# Patient Record
Sex: Male | Born: 1975 | Race: White | Hispanic: No | Marital: Married | Smoking: Current every day smoker
Health system: Southern US, Community
[De-identification: ages and names within clinical notes are randomized; demographics above are authoritative.]

## PROBLEM LIST (undated history)

## (undated) DIAGNOSIS — I509 Heart failure, unspecified: Secondary | ICD-10-CM

## (undated) DIAGNOSIS — F4310 Post-traumatic stress disorder, unspecified: Secondary | ICD-10-CM

## (undated) DIAGNOSIS — R51 Headache: Secondary | ICD-10-CM

## (undated) DIAGNOSIS — R519 Headache, unspecified: Secondary | ICD-10-CM

## (undated) DIAGNOSIS — I4891 Unspecified atrial fibrillation: Secondary | ICD-10-CM

## (undated) DIAGNOSIS — F1110 Opioid abuse, uncomplicated: Secondary | ICD-10-CM

## (undated) HISTORY — DX: Headache, unspecified: R51.9

## (undated) HISTORY — PX: TONSILLECTOMY: SUR1361

## (undated) HISTORY — DX: Headache: R51

---

## 1998-04-14 ENCOUNTER — Emergency Department (HOSPITAL_COMMUNITY): Admission: EM | Admit: 1998-04-14 | Discharge: 1998-04-14 | Payer: Self-pay | Admitting: Emergency Medicine

## 1999-01-29 ENCOUNTER — Emergency Department (HOSPITAL_COMMUNITY): Admission: EM | Admit: 1999-01-29 | Discharge: 1999-01-29 | Payer: Self-pay | Admitting: Emergency Medicine

## 2002-10-18 ENCOUNTER — Emergency Department (HOSPITAL_COMMUNITY): Admission: EM | Admit: 2002-10-18 | Discharge: 2002-10-18 | Payer: Self-pay | Admitting: Emergency Medicine

## 2003-03-07 ENCOUNTER — Emergency Department (HOSPITAL_COMMUNITY): Admission: EM | Admit: 2003-03-07 | Discharge: 2003-03-07 | Payer: Self-pay | Admitting: Emergency Medicine

## 2003-11-19 ENCOUNTER — Encounter: Admission: RE | Admit: 2003-11-19 | Discharge: 2003-11-19 | Payer: Self-pay | Admitting: Family Medicine

## 2003-12-11 ENCOUNTER — Encounter: Admission: RE | Admit: 2003-12-11 | Discharge: 2003-12-11 | Payer: Self-pay | Admitting: Family Medicine

## 2004-03-06 ENCOUNTER — Emergency Department (HOSPITAL_COMMUNITY): Admission: EM | Admit: 2004-03-06 | Discharge: 2004-03-06 | Payer: Self-pay | Admitting: Emergency Medicine

## 2004-04-25 ENCOUNTER — Emergency Department (HOSPITAL_COMMUNITY): Admission: EM | Admit: 2004-04-25 | Discharge: 2004-04-25 | Payer: Self-pay | Admitting: Family Medicine

## 2004-08-03 ENCOUNTER — Ambulatory Visit (HOSPITAL_COMMUNITY): Admission: RE | Admit: 2004-08-03 | Discharge: 2004-08-03 | Payer: Self-pay | Admitting: Otolaryngology

## 2004-08-03 ENCOUNTER — Encounter (INDEPENDENT_AMBULATORY_CARE_PROVIDER_SITE_OTHER): Payer: Self-pay | Admitting: *Deleted

## 2004-08-03 ENCOUNTER — Ambulatory Visit (HOSPITAL_BASED_OUTPATIENT_CLINIC_OR_DEPARTMENT_OTHER): Admission: RE | Admit: 2004-08-03 | Discharge: 2004-08-03 | Payer: Self-pay | Admitting: Otolaryngology

## 2004-11-29 ENCOUNTER — Emergency Department (HOSPITAL_COMMUNITY): Admission: EM | Admit: 2004-11-29 | Discharge: 2004-11-29 | Payer: Self-pay | Admitting: Emergency Medicine

## 2005-02-02 ENCOUNTER — Emergency Department (HOSPITAL_COMMUNITY): Admission: EM | Admit: 2005-02-02 | Discharge: 2005-02-03 | Payer: Self-pay | Admitting: Emergency Medicine

## 2005-02-03 ENCOUNTER — Ambulatory Visit: Payer: Self-pay | Admitting: Psychiatry

## 2005-02-03 ENCOUNTER — Inpatient Hospital Stay (HOSPITAL_COMMUNITY): Admission: RE | Admit: 2005-02-03 | Discharge: 2005-02-04 | Payer: Self-pay | Admitting: Psychiatry

## 2005-02-03 ENCOUNTER — Emergency Department (HOSPITAL_COMMUNITY): Admission: EM | Admit: 2005-02-03 | Discharge: 2005-02-03 | Payer: Self-pay | Admitting: Emergency Medicine

## 2005-02-14 ENCOUNTER — Emergency Department: Payer: Self-pay | Admitting: Emergency Medicine

## 2005-03-28 ENCOUNTER — Emergency Department (HOSPITAL_COMMUNITY): Admission: EM | Admit: 2005-03-28 | Discharge: 2005-03-28 | Payer: Self-pay | Admitting: Emergency Medicine

## 2005-04-13 ENCOUNTER — Emergency Department (HOSPITAL_COMMUNITY): Admission: EM | Admit: 2005-04-13 | Discharge: 2005-04-13 | Payer: Self-pay | Admitting: Emergency Medicine

## 2005-04-14 ENCOUNTER — Ambulatory Visit: Payer: Self-pay | Admitting: Unknown Physician Specialty

## 2005-05-09 ENCOUNTER — Emergency Department (HOSPITAL_COMMUNITY): Admission: EM | Admit: 2005-05-09 | Discharge: 2005-05-09 | Payer: Self-pay | Admitting: Family Medicine

## 2005-05-14 ENCOUNTER — Ambulatory Visit: Payer: Self-pay | Admitting: Unknown Physician Specialty

## 2005-06-13 ENCOUNTER — Emergency Department (HOSPITAL_COMMUNITY): Admission: EM | Admit: 2005-06-13 | Discharge: 2005-06-13 | Payer: Self-pay | Admitting: Family Medicine

## 2005-06-14 ENCOUNTER — Emergency Department: Payer: Self-pay | Admitting: General Practice

## 2005-07-21 ENCOUNTER — Emergency Department (HOSPITAL_COMMUNITY): Admission: EM | Admit: 2005-07-21 | Discharge: 2005-07-21 | Payer: Self-pay | Admitting: Emergency Medicine

## 2005-08-27 ENCOUNTER — Emergency Department (HOSPITAL_COMMUNITY): Admission: EM | Admit: 2005-08-27 | Discharge: 2005-08-27 | Payer: Self-pay | Admitting: Emergency Medicine

## 2005-08-31 ENCOUNTER — Emergency Department (HOSPITAL_COMMUNITY): Admission: EM | Admit: 2005-08-31 | Discharge: 2005-08-31 | Payer: Self-pay | Admitting: Family Medicine

## 2005-09-15 ENCOUNTER — Emergency Department (HOSPITAL_COMMUNITY): Admission: EM | Admit: 2005-09-15 | Discharge: 2005-09-16 | Payer: Self-pay | Admitting: Emergency Medicine

## 2005-09-23 ENCOUNTER — Emergency Department (HOSPITAL_COMMUNITY): Admission: EM | Admit: 2005-09-23 | Discharge: 2005-09-23 | Payer: Self-pay | Admitting: Family Medicine

## 2005-09-30 ENCOUNTER — Emergency Department (HOSPITAL_COMMUNITY): Admission: EM | Admit: 2005-09-30 | Discharge: 2005-09-30 | Payer: Self-pay | Admitting: Family Medicine

## 2005-10-09 ENCOUNTER — Emergency Department (HOSPITAL_COMMUNITY): Admission: EM | Admit: 2005-10-09 | Discharge: 2005-10-09 | Payer: Self-pay | Admitting: Family Medicine

## 2005-11-03 ENCOUNTER — Emergency Department (HOSPITAL_COMMUNITY): Admission: EM | Admit: 2005-11-03 | Discharge: 2005-11-03 | Payer: Self-pay | Admitting: Emergency Medicine

## 2005-11-13 ENCOUNTER — Emergency Department: Payer: Self-pay | Admitting: Emergency Medicine

## 2005-11-23 ENCOUNTER — Emergency Department (HOSPITAL_COMMUNITY): Admission: EM | Admit: 2005-11-23 | Discharge: 2005-11-23 | Payer: Self-pay | Admitting: Emergency Medicine

## 2005-11-23 ENCOUNTER — Emergency Department (HOSPITAL_COMMUNITY): Admission: EM | Admit: 2005-11-23 | Discharge: 2005-11-23 | Payer: Self-pay | Admitting: Family Medicine

## 2005-12-17 ENCOUNTER — Emergency Department (HOSPITAL_COMMUNITY): Admission: EM | Admit: 2005-12-17 | Discharge: 2005-12-17 | Payer: Self-pay | Admitting: Family Medicine

## 2006-01-23 ENCOUNTER — Emergency Department: Payer: Self-pay | Admitting: Emergency Medicine

## 2006-02-12 ENCOUNTER — Emergency Department (HOSPITAL_COMMUNITY): Admission: EM | Admit: 2006-02-12 | Discharge: 2006-02-12 | Payer: Self-pay | Admitting: Family Medicine

## 2006-07-07 ENCOUNTER — Emergency Department (HOSPITAL_COMMUNITY): Admission: EM | Admit: 2006-07-07 | Discharge: 2006-07-07 | Payer: Self-pay | Admitting: Family Medicine

## 2007-01-22 ENCOUNTER — Emergency Department (HOSPITAL_COMMUNITY): Admission: EM | Admit: 2007-01-22 | Discharge: 2007-01-22 | Payer: Self-pay | Admitting: Emergency Medicine

## 2007-02-18 ENCOUNTER — Emergency Department (HOSPITAL_COMMUNITY): Admission: EM | Admit: 2007-02-18 | Discharge: 2007-02-18 | Payer: Self-pay | Admitting: Emergency Medicine

## 2007-03-28 ENCOUNTER — Emergency Department (HOSPITAL_COMMUNITY): Admission: EM | Admit: 2007-03-28 | Discharge: 2007-03-28 | Payer: Self-pay | Admitting: Emergency Medicine

## 2007-04-05 ENCOUNTER — Emergency Department (HOSPITAL_COMMUNITY): Admission: EM | Admit: 2007-04-05 | Discharge: 2007-04-05 | Payer: Self-pay | Admitting: Family Medicine

## 2007-05-01 ENCOUNTER — Emergency Department (HOSPITAL_COMMUNITY): Admission: EM | Admit: 2007-05-01 | Discharge: 2007-05-01 | Payer: Self-pay | Admitting: Family Medicine

## 2010-10-20 ENCOUNTER — Emergency Department (HOSPITAL_COMMUNITY)
Admission: EM | Admit: 2010-10-20 | Discharge: 2010-10-21 | Disposition: A | Discharge status: Other Acute Inpatient Hospital | Payer: PRIVATE HEALTH INSURANCE | Attending: Emergency Medicine | Admitting: Emergency Medicine

## 2010-10-20 DIAGNOSIS — F1910 Other psychoactive substance abuse, uncomplicated: Secondary | ICD-10-CM | POA: Insufficient documentation

## 2010-10-20 DIAGNOSIS — F411 Generalized anxiety disorder: Secondary | ICD-10-CM | POA: Insufficient documentation

## 2010-10-20 LAB — POCT I-STAT, CHEM 8
BUN: 9 mg/dL (ref 6–23)
Calcium, Ion: 1.06 mmol/L — ABNORMAL LOW (ref 1.12–1.32)
Chloride: 106 mEq/L (ref 96–112)
Creatinine, Ser: 1.2 mg/dL (ref 0.4–1.5)
Glucose, Bld: 78 mg/dL (ref 70–99)
HCT: 45 % (ref 39.0–52.0)
Hemoglobin: 15.3 g/dL (ref 13.0–17.0)
Potassium: 4.1 mEq/L (ref 3.5–5.1)
Sodium: 143 mEq/L (ref 135–145)
TCO2: 26 mmol/L (ref 0–100)

## 2010-10-20 LAB — RAPID URINE DRUG SCREEN, HOSP PERFORMED
Amphetamines: NOT DETECTED
Barbiturates: NOT DETECTED
Benzodiazepines: POSITIVE — AB
Cocaine: POSITIVE — AB
Opiates: NOT DETECTED
Tetrahydrocannabinol: NOT DETECTED

## 2010-10-20 LAB — ETHANOL

## 2010-10-21 ENCOUNTER — Inpatient Hospital Stay (HOSPITAL_COMMUNITY)
Admission: AD | Admit: 2010-10-21 | Discharge: 2010-10-26 | DRG: 897 | Disposition: A | Discharge status: Home / Self Care | Payer: PRIVATE HEALTH INSURANCE | Source: Ambulatory Visit | Attending: Psychiatry | Admitting: Psychiatry

## 2010-10-21 DIAGNOSIS — F1920 Other psychoactive substance dependence, uncomplicated: Secondary | ICD-10-CM

## 2010-10-21 DIAGNOSIS — F1020 Alcohol dependence, uncomplicated: Principal | ICD-10-CM

## 2010-10-21 DIAGNOSIS — F1994 Other psychoactive substance use, unspecified with psychoactive substance-induced mood disorder: Secondary | ICD-10-CM

## 2010-10-21 DIAGNOSIS — Z560 Unemployment, unspecified: Secondary | ICD-10-CM

## 2010-10-21 DIAGNOSIS — Z6379 Other stressful life events affecting family and household: Secondary | ICD-10-CM

## 2010-10-21 DIAGNOSIS — M549 Dorsalgia, unspecified: Secondary | ICD-10-CM

## 2010-10-21 DIAGNOSIS — F3289 Other specified depressive episodes: Secondary | ICD-10-CM

## 2010-10-21 DIAGNOSIS — F329 Major depressive disorder, single episode, unspecified: Secondary | ICD-10-CM

## 2010-10-21 DIAGNOSIS — F1320 Sedative, hypnotic or anxiolytic dependence, uncomplicated: Secondary | ICD-10-CM

## 2010-10-21 DIAGNOSIS — F4310 Post-traumatic stress disorder, unspecified: Secondary | ICD-10-CM

## 2010-10-21 DIAGNOSIS — G8929 Other chronic pain: Secondary | ICD-10-CM

## 2010-10-22 DIAGNOSIS — F1920 Other psychoactive substance dependence, uncomplicated: Secondary | ICD-10-CM
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
## 2010-10-30 NOTE — Op Note (Signed)
Kenneth [name], Kenneth [name]                 ACCOUNT NO.:  [id number]   MEDICAL RECORD NO.:  [id number]          PATIENT TYPE:  AMB   LOCATION:  DSC                          FACILITY:  [facility]   PHYSICIAN:  Jefry H. Pollyann Kennedy, MD     DATE OF BIRTH:  Dec 14, 1975   DATE OF PROCEDURE:  08/03/2004  DATE OF DISCHARGE:                                 OPERATIVE REPORT   PREOPERATIVE DIAGNOSIS:  Chronic tonsillitis.   POSTOPERATIVE DIAGNOSIS:  Chronic tonsillitis.   PROCEDURE:  Tonsillectomy.   SURGEON:  Jefry H. Pollyann Kennedy, M.D.   ANESTHESIA:  General endotracheal.   COMPLICATIONS:  None.   BLOOD LOSS:  None.   FINDINGS:  Diffuse tonsillar enlargement with deep cryptic spaces and  tonsillolithiasis.   REFERRING PHYSICIAN:  Dr. Julian Reil.   HISTORY:  This is a 35 year old with a history of chronic and recurring  tonsillar pharyngitis.  The risks, benefits,  alternatives, and  complications of the procedure were explained to the patient, he seemed to  understand and agreed to surgery.   DESCRIPTION OF PROCEDURE:  The patient was taken to the operating room and  placed on the operating table in the supine position.  Following induction  of general endotracheal anesthesia, the table was turned and the patient was  draped in the standard fashion.  A Crowe-Davis mouth gag was inserted into  the oral cavity and used to retract the tongue and mandible and attached to  the Mayo stand.  A red rubber catheter was inserted into the right side of  the nose and drawn into the mouth and used to retract the soft palate and  uvula.  Direct examination of the nasopharynx was performed and the  nasopharynx was clear, there was no appreciable adenoid tissue.  The  tonsillectomy was performed using electrocautery dissection, carefully  dissecting the neovascular plane between the capsule and constrictor  muscles.  Spot cautery was used as needed for completion of hemostasis.  The  tonsils were  sent together for  pathologic evaluation.  The pharynx was suctioned of  secretions, irrigated with saline and an orogastric tube to aspirate the  contents of the stomach.  The patient was then awakened, extubated and  transferred to recovery in stable condition.      JHR/MEDQ  D:  08/03/2004  T:  08/03/2004  Job:  [id number]

## 2010-10-30 NOTE — Discharge Summary (Signed)
NAMEHURLEY, SOBEL NO.:  [id number]   MEDICAL RECORD NO.:  [id number]          PATIENT TYPE:  IPS   LOCATION:  0505                          [facility]ACILITY:  [facility]   PHYSICIAN:  Anselm Jungling, MD  DATE OF BIRTH:  03/28/1976   DATE OF ADMISSION:  02/03/2005  DATE OF DISCHARGE:  02/04/2005                                 DISCHARGE SUMMARY   IDENTIFYING DATA/REASON FOR ADMISSION:  This was the first [facility] admission for  Romey, a 35 year old white male admitted for substance abuse detoxification.  He was presenting with apparently no prior or comorbid psychiatric  condition.  His substance abuse had consisted chiefly of opioids and  cocaine.  Please refer to the admission note for further details pertaining  to the symptoms, circumstances and history that led to his hospitalization  at [facility].   MEDICAL/LABORATORY:  There were no significant medical issues during this  brief inpatient psychiatric stay.  An admission CBC was within normal  limits.  The patient was physically examined and cleared at [facility] Emergency Department.   HOSPITAL COURSE:  The patient was placed on a clonidine detox protocol.  He  was given Ambien for a p.r.n. basis for sleep.  He was also provided Xanax  0.5 mg p.o. t.i.d. to address withdrawal symptoms, and to address  depression, Effexor 75 mg p.o. daily.   The patient participated in various therapeutic groups, activities and  classes designed to help him acquire better coping skills, a better  understanding of his substance abuse issues, and the development of an  aftercare plan.   The patient chose to be discharged on the second hospital day.  He indicated  that he was open to following up in the intensive outpatient program.   AFTERCARE:  The patient was to begin at the intensive outpatient program on  February 05, 2005.   DISCHARGE MEDICATIONS:  1.  Effexor XR 75 mg daily.  2.  Xanax as previously prescribed by Dr. Renato Gails,  with anticipation of it      being tapered to discontinuation due to chemical dependency issues.   DISCHARGE DIAGNOSES:  AXIS I:  Polysubstance abuse/dependency.  AXIS II:  Deferred.  AXIS III:  No acute or chronic illnesses.  AXIS IV:  Stressors:  Severe.  AXIS V:  GAF on discharge 65.          ______________________________  Anselm Jungling, MD  Electronically Signed    SPB/MEDQ  D:  03/01/2005  T:  03/01/2005  Job:  [id number]
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
## 2011-12-06 ENCOUNTER — Encounter (HOSPITAL_COMMUNITY): Payer: Self-pay | Admitting: *Deleted

## 2011-12-06 ENCOUNTER — Inpatient Hospital Stay (HOSPITAL_COMMUNITY)
Admission: AD | Admit: 2011-12-06 | Discharge: 2011-12-11 | DRG: 897 | Disposition: A | Discharge status: Home / Self Care | Payer: PRIVATE HEALTH INSURANCE | Source: Ambulatory Visit | Attending: Psychiatry | Admitting: Psychiatry

## 2011-12-06 ENCOUNTER — Emergency Department (HOSPITAL_COMMUNITY)
Admission: EM | Admit: 2011-12-06 | Discharge: 2011-12-06 | Disposition: A | Discharge status: Other Acute Inpatient Hospital | Payer: PRIVATE HEALTH INSURANCE | Attending: Emergency Medicine | Admitting: Emergency Medicine

## 2011-12-06 DIAGNOSIS — Z79899 Other long term (current) drug therapy: Secondary | ICD-10-CM | POA: Insufficient documentation

## 2011-12-06 DIAGNOSIS — F1920 Other psychoactive substance dependence, uncomplicated: Secondary | ICD-10-CM | POA: Diagnosis present

## 2011-12-06 DIAGNOSIS — F17200 Nicotine dependence, unspecified, uncomplicated: Secondary | ICD-10-CM | POA: Insufficient documentation

## 2011-12-06 DIAGNOSIS — F1910 Other psychoactive substance abuse, uncomplicated: Secondary | ICD-10-CM | POA: Insufficient documentation

## 2011-12-06 DIAGNOSIS — F1994 Other psychoactive substance use, unspecified with psychoactive substance-induced mood disorder: Secondary | ICD-10-CM | POA: Diagnosis present

## 2011-12-06 DIAGNOSIS — F10939 Alcohol use, unspecified with withdrawal, unspecified: Secondary | ICD-10-CM | POA: Diagnosis present

## 2011-12-06 DIAGNOSIS — F1020 Alcohol dependence, uncomplicated: Secondary | ICD-10-CM | POA: Diagnosis present

## 2011-12-06 DIAGNOSIS — F19939 Other psychoactive substance use, unspecified with withdrawal, unspecified: Principal | ICD-10-CM | POA: Diagnosis present

## 2011-12-06 DIAGNOSIS — F10239 Alcohol dependence with withdrawal, unspecified: Secondary | ICD-10-CM | POA: Diagnosis present

## 2011-12-06 DIAGNOSIS — L989 Disorder of the skin and subcutaneous tissue, unspecified: Secondary | ICD-10-CM | POA: Insufficient documentation

## 2011-12-06 DIAGNOSIS — F4310 Post-traumatic stress disorder, unspecified: Secondary | ICD-10-CM | POA: Insufficient documentation

## 2011-12-06 HISTORY — DX: Post-traumatic stress disorder, unspecified: F43.10

## 2011-12-06 LAB — CBC
HCT: 42.5 % (ref 39.0–52.0)
Hemoglobin: 14.5 g/dL (ref 13.0–17.0)
MCH: 29.9 pg (ref 26.0–34.0)
MCHC: 34.1 g/dL (ref 30.0–36.0)
MCV: 87.6 fL (ref 78.0–100.0)
Platelets: 294 10*3/uL (ref 150–400)
RBC: 4.85 MIL/uL (ref 4.22–5.81)
RDW: 14.2 % (ref 11.5–15.5)
WBC: 6.2 10*3/uL (ref 4.0–10.5)

## 2011-12-06 LAB — COMPREHENSIVE METABOLIC PANEL
ALT: 11 U/L (ref 0–53)
AST: 18 U/L (ref 0–37)
Albumin: 4.5 g/dL (ref 3.5–5.2)
Alkaline Phosphatase: 67 U/L (ref 39–117)
BUN: 11 mg/dL (ref 6–23)
CO2: 28 mEq/L (ref 19–32)
Calcium: 9.6 mg/dL (ref 8.4–10.5)
Chloride: 99 mEq/L (ref 96–112)
Creatinine, Ser: 1.01 mg/dL (ref 0.50–1.35)
GFR calc Af Amer: >90 mL/min (ref >90)
GFR calc non Af Amer: >90 mL/min (ref >90)
Glucose, Bld: 114 mg/dL — ABNORMAL HIGH (ref 70–99)
Potassium: 4.1 mEq/L (ref 3.5–5.1)
Sodium: 136 mEq/L (ref 135–145)
Total Bilirubin: 0.3 mg/dL (ref 0.3–1.2)
Total Protein: 7.6 g/dL (ref 6.0–8.3)

## 2011-12-06 LAB — RAPID URINE DRUG SCREEN, HOSP PERFORMED
Amphetamines: NOT DETECTED
Barbiturates: NOT DETECTED
Benzodiazepines: POSITIVE — AB
Cocaine: POSITIVE — AB
Opiates: NOT DETECTED
Tetrahydrocannabinol: NOT DETECTED

## 2011-12-06 LAB — ETHANOL: Alcohol, Ethyl (B): <11 mg/dL (ref 0–11)

## 2011-12-06 LAB — ACETAMINOPHEN LEVEL: Acetaminophen (Tylenol), Serum: <15 ug/mL (ref 10–30)

## 2011-12-06 MED ORDER — DICYCLOMINE HCL 20 MG PO TABS
20.0000 mg | ORAL_TABLET | Freq: Four times a day (QID) | ORAL | Status: DC | PRN
  Administered 2011-12-07: 20 mg via ORAL
  Filled 2011-12-06: qty 1

## 2011-12-06 MED ORDER — METHOCARBAMOL 500 MG PO TABS: 500.0000 mg | ORAL_TABLET | Freq: Three times a day (TID) | ORAL | Status: DC | PRN

## 2011-12-06 MED ORDER — ONDANSETRON 4 MG PO TBDP: 4.0000 mg | ORAL_TABLET | Freq: Four times a day (QID) | ORAL | Status: DC | PRN

## 2011-12-06 MED ORDER — ONDANSETRON 4 MG PO TBDP
4.0000 mg | ORAL_TABLET | Freq: Four times a day (QID) | ORAL | Status: DC | PRN
  Administered 2011-12-07: 4 mg via ORAL

## 2011-12-06 MED ORDER — ADULT MULTIVITAMIN W/MINERALS CH
1.0000 | ORAL_TABLET | Freq: Every day | ORAL | Status: DC
  Administered 2011-12-06 – 2011-12-10 (×5): 1 via ORAL
  Filled 2011-12-06 (×8): qty 1

## 2011-12-06 MED ORDER — LOPERAMIDE HCL 2 MG PO CAPS: 2.0000 mg | ORAL_CAPSULE | ORAL | Status: DC | PRN

## 2011-12-06 MED ORDER — NICOTINE 21 MG/24HR TD PT24
21.0000 mg | MEDICATED_PATCH | Freq: Every day | TRANSDERMAL | Status: DC
  Administered 2011-12-06 – 2011-12-11 (×6): 21 mg via TRANSDERMAL
  Filled 2011-12-06 (×9): qty 1

## 2011-12-06 MED ORDER — METHOCARBAMOL 500 MG PO TABS
500.0000 mg | ORAL_TABLET | Freq: Three times a day (TID) | ORAL | Status: DC | PRN
  Administered 2011-12-06 – 2011-12-07 (×2): 500 mg via ORAL
  Filled 2011-12-06 (×2): qty 1

## 2011-12-06 MED ORDER — HYDROXYZINE HCL 25 MG PO TABS: 25.0000 mg | ORAL_TABLET | Freq: Four times a day (QID) | ORAL | Status: DC | PRN

## 2011-12-06 MED ORDER — CLONIDINE HCL 0.1 MG PO TABS
0.1000 mg | ORAL_TABLET | ORAL | Status: AC
  Administered 2011-12-09 – 2011-12-10 (×3): 0.1 mg via ORAL
  Filled 2011-12-06 (×4): qty 1

## 2011-12-06 MED ORDER — DICYCLOMINE HCL 20 MG PO TABS: 20.0000 mg | ORAL_TABLET | Freq: Four times a day (QID) | ORAL | Status: DC | PRN

## 2011-12-06 MED ORDER — NAPROXEN 500 MG PO TABS
500.0000 mg | ORAL_TABLET | Freq: Two times a day (BID) | ORAL | Status: DC | PRN
  Administered 2011-12-06 – 2011-12-07 (×2): 500 mg via ORAL
  Filled 2011-12-06 (×2): qty 1

## 2011-12-06 MED ORDER — MAGNESIUM HYDROXIDE 400 MG/5ML PO SUSP
30.0000 mL | Freq: Every day | ORAL | Status: DC | PRN
  Administered 2011-12-09 – 2011-12-11 (×2): 30 mL via ORAL

## 2011-12-06 MED ORDER — CITALOPRAM HYDROBROMIDE 20 MG PO TABS
20.0000 mg | ORAL_TABLET | Freq: Every day | ORAL | Status: DC
  Administered 2011-12-06 – 2011-12-09 (×4): 20 mg via ORAL
  Filled 2011-12-06 (×7): qty 1

## 2011-12-06 MED ORDER — CHLORDIAZEPOXIDE HCL 25 MG PO CAPS
25.0000 mg | ORAL_CAPSULE | Freq: Four times a day (QID) | ORAL | Status: AC
  Administered 2011-12-06 – 2011-12-07 (×6): 25 mg via ORAL
  Filled 2011-12-06 (×6): qty 1

## 2011-12-06 MED ORDER — ALUM & MAG HYDROXIDE-SIMETH 200-200-20 MG/5ML PO SUSP
30.0000 mL | ORAL | Status: DC | PRN
  Administered 2011-12-07: 30 mL via ORAL

## 2011-12-06 MED ORDER — ACETAMINOPHEN 325 MG PO TABS
650.0000 mg | ORAL_TABLET | Freq: Four times a day (QID) | ORAL | Status: DC | PRN
  Administered 2011-12-09: 650 mg via ORAL

## 2011-12-06 MED ORDER — CHLORDIAZEPOXIDE HCL 25 MG PO CAPS
25.0000 mg | ORAL_CAPSULE | ORAL | Status: AC
  Administered 2011-12-09 (×2): 25 mg via ORAL
  Filled 2011-12-06 (×2): qty 1

## 2011-12-06 MED ORDER — VITAMIN B-1 100 MG PO TABS
100.0000 mg | ORAL_TABLET | Freq: Every day | ORAL | Status: DC
  Filled 2011-12-06: qty 1

## 2011-12-06 MED ORDER — CHLORDIAZEPOXIDE HCL 25 MG PO CAPS
25.0000 mg | ORAL_CAPSULE | Freq: Every day | ORAL | Status: AC
  Administered 2011-12-10: 25 mg via ORAL
  Filled 2011-12-06: qty 1

## 2011-12-06 MED ORDER — IBUPROFEN 800 MG PO TABS
800.0000 mg | ORAL_TABLET | Freq: Once | ORAL | Status: AC
  Administered 2011-12-06: 800 mg via ORAL
  Filled 2011-12-06: qty 1

## 2011-12-06 MED ORDER — TRAZODONE HCL 100 MG PO TABS
150.0000 mg | ORAL_TABLET | Freq: Every evening | ORAL | Status: DC | PRN
  Administered 2011-12-06 – 2011-12-08 (×3): 150 mg via ORAL
  Filled 2011-12-06 (×3): qty 1

## 2011-12-06 MED ORDER — CLONIDINE HCL 0.1 MG PO TABS
0.1000 mg | ORAL_TABLET | Freq: Every day | ORAL | Status: DC
  Filled 2011-12-06 (×2): qty 1

## 2011-12-06 MED ORDER — CHLORDIAZEPOXIDE HCL 25 MG PO CAPS
25.0000 mg | ORAL_CAPSULE | Freq: Four times a day (QID) | ORAL | Status: AC | PRN
  Administered 2011-12-08: 25 mg via ORAL
  Filled 2011-12-06: qty 1

## 2011-12-06 MED ORDER — THIAMINE HCL 100 MG/ML IJ SOLN: 100.0000 mg | Freq: Once | INTRAMUSCULAR | Status: DC

## 2011-12-06 MED ORDER — LORAZEPAM 1 MG PO TABS: 1.0000 mg | ORAL_TABLET | Freq: Three times a day (TID) | ORAL | Status: DC | PRN

## 2011-12-06 MED ORDER — NICOTINE 14 MG/24HR TD PT24
14.0000 mg | MEDICATED_PATCH | Freq: Every day | TRANSDERMAL | Status: DC
  Filled 2011-12-06: qty 1

## 2011-12-06 MED ORDER — CHLORDIAZEPOXIDE HCL 25 MG PO CAPS
25.0000 mg | ORAL_CAPSULE | Freq: Three times a day (TID) | ORAL | Status: AC
  Administered 2011-12-08 (×3): 25 mg via ORAL
  Filled 2011-12-06 (×3): qty 1

## 2011-12-06 MED ORDER — CLONIDINE HCL 0.1 MG PO TABS
0.1000 mg | ORAL_TABLET | Freq: Four times a day (QID) | ORAL | Status: AC
  Administered 2011-12-06 – 2011-12-08 (×8): 0.1 mg via ORAL
  Filled 2011-12-06 (×11): qty 1

## 2011-12-06 MED ORDER — NAPROXEN 500 MG PO TABS: 500.0000 mg | ORAL_TABLET | Freq: Two times a day (BID) | ORAL | Status: DC | PRN

## 2011-12-06 MED ORDER — HYDROXYZINE HCL 25 MG PO TABS
25.0000 mg | ORAL_TABLET | Freq: Four times a day (QID) | ORAL | Status: DC | PRN
  Administered 2011-12-07: 25 mg via ORAL

## 2011-12-06 MED ORDER — CHLORDIAZEPOXIDE HCL 25 MG PO CAPS
50.0000 mg | ORAL_CAPSULE | Freq: Once | ORAL | Status: AC
  Administered 2011-12-06: 50 mg via ORAL
  Filled 2011-12-06: qty 2

## 2011-12-06 MED ORDER — LORAZEPAM 1 MG PO TABS
1.0000 mg | ORAL_TABLET | Freq: Once | ORAL | Status: AC
  Administered 2011-12-06: 1 mg via ORAL
  Filled 2011-12-06 (×2): qty 1

## 2011-12-06 NOTE — Progress Notes (Signed)
Admission Note:  Voluntary admission for this 36 yo male for polysubstance abuse.  Patient was brought into [facility] by private vehicle.  He has been abusing crack, benzos and alcohol for the past two years.  He has a history of methodone and suboxone use.  He had been using 2.5 strips of suboxone.  He is aware that he will not receive suboxone treatment here.  His last drink of ETOH was Sunday the 23rd.  His CIWA is a 6.  He complains of generalized pain and burning upon admission.  He denies any SI/HI/AVH.  He is cooperative.  Patient was oriented to room and unit.  He did state that he was admitted to [facility] 2 years ago for detox.

## 2011-12-06 NOTE — Tx Team (Signed)
Initial Interdisciplinary Treatment Plan  PATIENT STRENGTHS: (choose at least two) Average or above average intelligence Capable of independent living Communication skills General fund of knowledge Motivation for treatment/growth Supportive family/friends  PATIENT STRESSORS: Financial difficulties Occupational concerns Substance abuse   PROBLEM LIST: Problem List/Patient Goals Date to be addressed Date deferred Reason deferred Estimated date of resolution  Substance Abuse 12-06-11   Discharge  Depression 12-06-11   Discharge                                             DISCHARGE CRITERIA:  Ability to meet basic life and health needs Improved stabilization in mood, thinking, and/or behavior Motivation to continue treatment in a less acute level of care Verbal commitment to aftercare and medication compliance Withdrawal symptoms are absent or subacute and managed without 24-hour nursing intervention  PRELIMINARY DISCHARGE PLAN: Attend aftercare/continuing care group Attend 12-step recovery group Return to previous living arrangement  PATIENT/FAMIILY INVOLVEMENT: This treatment plan has been presented to and reviewed with the patient, Kenneth [name].  The patient and family have been given the opportunity to ask questions and make suggestions.  [place] Mon 12/06/2011, 3:32 PM

## 2011-12-06 NOTE — Progress Notes (Signed)
12/06/11 Late addendum for 0845. Clinical information reviewed with Aggie Nwoko P.A. and pt. accepted to room 304-2 to the service of Dr. Koren Shiver. Act notified by Hattie Perch. Doristine Locks RN [facility]

## 2011-12-06 NOTE — BHH Counselor (Signed)
Patient accepted to [facility] (Serena Colonel, PA to Lely, MD). Patient assigned to bed 304-2.  The EDP (Dr. Wyman Songster) made aware of patients disposition and agrees to discharge patient to [facility]. Patients nurse Victorino Dike) also made aware. Dr  All support paperwork has been completed and faxed to [facility].

## 2011-12-06 NOTE — ED Notes (Signed)
Pt here for c/o detox. Pt states he wants detox from crack cocaine, benzodiazepines, ETOH. Pt states he drinks a six pack a day for two years. Pt reports his last drink was 12/05/11 around 4 pm. Pt is tearful upon assessment. Pt states he has thought about hurting himself in the past, but denies any SI/HI at this time. Pt also reports he wants detox from "Suboxone."

## 2011-12-06 NOTE — ED Notes (Signed)
MD at bedside. 
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
## 2011-12-06 NOTE — ED Notes (Signed)
Lab bedside.

## 2011-12-06 NOTE — ED Notes (Signed)
Attempted to call report to [facility].  While discussing pt.'s complaint/request which included detox from Suboxone.  BH RN stated that she would check with about pt./suboxone.  [facility] ACT states that is what pt. Requested when he presented to the ED, but he understands that [facility] does not have detox.  Pt. Stated per ACT that he will not be getting detox from suboxone.

## 2011-12-06 NOTE — ED Notes (Signed)
Called report to [place] at [facility].  BH ready for pt.

## 2011-12-06 NOTE — [facility] Counselor (Signed)
Patient's nurse-Jennifer has attempted to call report. Upon this process Victorino Dike read to the nurse at [facility] the chief complaint "patient requesting Suboxene treatment". The nurse informed Victorino Dike that [facility] does not offer this treatment. Report was ended and nurse on the other end stated that she would call back after speaking to the [facility] about patients Suboxene treatment. Nurse informed this Clinical research associate of the above information.   Writer spoke with patient and made sure he was clear that Suboxene is not offered at [facility]. Patient also informed that [facility] is not a form of treatment offered.   Assessment office staff was then contacted and made aware that patient does not want Suboxene and [facility] does not provide Suboxene as a form of treatment. AC-Tori also notified of this information.   Later received a call from AC-Tori with the following request: 1. Patient must be made aware that he will be detoxed from Suboxene "cold [place]" and no tapering will be involved for Suboxene. 2. Patient must be made aware that coming off of Suboxene is uncomfortable.  3. Patient must be made aware that their are outpatient providers in the [facility] as Dr. Mila Homer that will taper patient off of Suboxene.  Writer discussed all of the above #1-3 with patient. Patient agreed and fully understood all of the above information. AC-Tori was notified that patient agreed. Delorise Jackson will discuss this matter with Dr. Allena Katz and if he agrees patient will then be taken to [facility] for his treatment.  Later, received another call from Tori asking if this writer could discuss with patient that he would not be given any "Klonopin" as he was given here at [facility]. Patient made aware that he would not be given anything that is addictive.  Patient again, agreed and understood this information. The AC-Tori was made aware and will discuss this information further with Dr. Allena Katz.

## 2011-12-06 NOTE — ED Provider Notes (Addendum)
Pt w substance abuse, requesting rehab program.  Pt resting comfortably. No distress. Vitals stable. Discussed w act team.   Suzi Roots, MD 12/06/11 [phone]   Act team indicates pt accepted to [facility] [place], bed ready.   Suzi Roots, MD 12/06/11 [phone]

## 2011-12-06 NOTE — ED Provider Notes (Signed)
History     CSN: [id number]  Arrival date & time 12/06/11  0155   First MD Initiated Contact with Patient 12/06/11 0236      Chief Complaint  Patient presents with  . Medical Clearance    Detox    HPI The patient presents with request for assistance with detox.  He notes over the past week he's been using cocaine, alcohol, Klonopin, Suboxone.  He states that prior to this.  She was generally well.  He does not a prior history of substance dependence and prior inpatient detoxification status.  He denies physical complaints. Past Medical History  Diagnosis Date  . PTSD (post-traumatic stress disorder)     Past Surgical History  Procedure Date  . Tonsillectomy     No family history on file.  History  Substance Use Topics  . Smoking status: Current Everyday Smoker  . Smokeless tobacco: Current User  . Alcohol Use: Yes     six pack a day      Review of Systems  Constitutional:       Per HPI, otherwise negative  HENT:       Per HPI, otherwise negative  Eyes: Negative.   Respiratory:       Per HPI, otherwise negative  Cardiovascular:       Per HPI, otherwise negative  Gastrointestinal: Negative for vomiting.  Genitourinary: Negative.   Musculoskeletal:       Per HPI, otherwise negative  Skin: Positive for wound.       The patient complains of multiple scabbed over lesions on his left distal ankle and low back.  Neurological: Negative for syncope.  Psychiatric/Behavioral:       PTSD    Allergies  Review of patient's allergies indicates no known allergies.  Home Medications   Current Outpatient Rx  Name Route Sig Dispense Refill  . BUPRENORPHINE HCL-NALOXONE HCL 8-2 MG SL SUBL Sublingual Place 1 tablet under the tongue daily.    Marland Kitchen CITALOPRAM HYDROBROMIDE 20 MG PO TABS Oral Take 20 mg by mouth daily.    Marland Kitchen CLONAZEPAM 1 MG PO TABS Oral Take 1 mg by mouth 2 (two) times daily as needed. anxiety      BP 135/87  Pulse 92  Temp 98.2 F (36.8 C) (Oral)  Resp  20  Ht 6\' 2"  (1.88 m)  Wt 210 lb (95.255 kg)  BMI 26.96 kg/m2  SpO2 99%  Physical Exam  Nursing note and vitals reviewed. Constitutional: He is oriented to person, place, and time. He appears well-developed. No distress.  HENT:  Head: Normocephalic and atraumatic.  Eyes: Conjunctivae and EOM are normal.  Cardiovascular: Normal rate and regular rhythm.   Pulmonary/Chest: Effort normal. No stridor. No respiratory distress.  Abdominal: He exhibits no distension.  Musculoskeletal: He exhibits no edema.  Neurological: He is alert and oriented to person, place, and time.  Skin: Skin is warm and dry.     Psychiatric: His speech is normal and behavior is normal. Judgment normal. His mood appears anxious. Cognition and memory are normal. He expresses no suicidal ideation.    ED Course  Procedures (including critical care time)  Labs Reviewed  COMPREHENSIVE METABOLIC PANEL - Abnormal; Notable for the following:    Glucose, Bld 114 (*)     All other components within normal limits  URINE RAPID DRUG SCREEN (HOSP PERFORMED) - Abnormal; Notable for the following:    Cocaine POSITIVE (*)     Benzodiazepines POSITIVE (*)     All  other components within normal limits  CBC  ETHANOL  ACETAMINOPHEN LEVEL   No results found.   No diagnosis found.    MDM  This generally well-appearing young male presents with a request for assistance with detoxification from multiple illicit substances.  On exam the patient is in no distress, though he is anxious.  The patient has some skin lesions, but no overt signs of cellulitis nor any systemic infection.  Given the patient's endorsement of using multiple substances, he has been medically cleared for further evaluation by behavioral health.     Gerhard Munch, MD 12/06/11 [phone]

## 2011-12-07 DIAGNOSIS — F1920 Other psychoactive substance dependence, uncomplicated: Secondary | ICD-10-CM | POA: Diagnosis present

## 2011-12-07 DIAGNOSIS — F1994 Other psychoactive substance use, unspecified with psychoactive substance-induced mood disorder: Secondary | ICD-10-CM | POA: Diagnosis present

## 2011-12-07 MED ORDER — LOPERAMIDE HCL 2 MG PO CAPS: 2.0000 mg | ORAL_CAPSULE | ORAL | Status: DC | PRN

## 2011-12-07 MED ORDER — METHOCARBAMOL 500 MG PO TABS
500.0000 mg | ORAL_TABLET | Freq: Three times a day (TID) | ORAL | Status: DC | PRN
  Administered 2011-12-07 – 2011-12-08 (×3): 500 mg via ORAL
  Filled 2011-12-07 (×3): qty 1

## 2011-12-07 MED ORDER — HYDROXYZINE HCL 25 MG PO TABS
25.0000 mg | ORAL_TABLET | Freq: Four times a day (QID) | ORAL | Status: DC | PRN
  Administered 2011-12-08 – 2011-12-11 (×5): 25 mg via ORAL

## 2011-12-07 MED ORDER — NAPROXEN 500 MG PO TABS
500.0000 mg | ORAL_TABLET | Freq: Two times a day (BID) | ORAL | Status: DC | PRN
  Administered 2011-12-08: 500 mg via ORAL
  Filled 2011-12-07: qty 1

## 2011-12-07 MED ORDER — ONDANSETRON 4 MG PO TBDP: 4.0000 mg | ORAL_TABLET | Freq: Four times a day (QID) | ORAL | Status: DC | PRN

## 2011-12-07 MED ORDER — DICYCLOMINE HCL 20 MG PO TABS
20.0000 mg | ORAL_TABLET | Freq: Four times a day (QID) | ORAL | Status: DC | PRN
  Administered 2011-12-07 – 2011-12-08 (×2): 20 mg via ORAL
  Filled 2011-12-07 (×2): qty 1

## 2011-12-07 NOTE — Progress Notes (Signed)
Recreation Therapy Notes  12/07/2011         Time: 1415      Group Topic/Focus: The focus of this group is on discussing various styles of communication and communicating assertively using 'I' (feeling) statements.   Participation Level: Active  Participation Quality: Attentive and Drowsy  Affect: Blunted  Cognitive: Oriented  Additional Comments: Patient observed to be nodding off x1 towards the end of group but was observed to be making efforts to keep himself awake.    [name] 12/07/2011 3:16 PM

## 2011-12-07 NOTE — Progress Notes (Signed)
Pt has been up for most groups today.  He rated both his depression and hopelessness a 6 on his self-inventory and his anxiety a 10.  He denied any S/H ideation or A/V hallucinations today.  Pt is due lab draw for his thyroid tonight. Pt had many prn meds this morning around 0820 which were effective for his symptoms.  His biggest compliant had been some sweating, and generalized pain.  His CIWA have been around 5 and 6 today.  Pt is still unclear of his plans for his discharge.
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
## 2011-12-07 NOTE — Progress Notes (Signed)
Patient ID: Kenneth [name], male   DOB: 1975-07-21, 36 y.o.   MRN: [id number] D: Pt. Sleep all this evening, did no attend group, upon awakening said he wasn't feeling well, but unable to described feeling. Writer noted some anxiety, irritability.  Pt. Got snacks and then talked on phone. A: Writer reviewed meds and administered as prescribed. R: Writer to assess for effectiveness of meds within an hour. Pt. Told to return if meds not effective. Staff will monitor q38min for safety.

## 2011-12-07 NOTE — Progress Notes (Addendum)
D: Patient anxious and depressed. Patient states, "I did not have a good day." Patient complains of anxiety.  Patients wife came and picked up his keys to his truck.  Latoyia L. Escorted patient and wife to search room to retrieve keys from patient locker and keys were placed in the possession of patient's wife documentation left in patients shadow chart. Patient denies SI/HI.  Patient interacting with in milieu  A: Patient offered encouragement and support and medications given. Patient required several prn medications. (see MAR)  Staff to monitor Q 15 min for safety. R: Patient cooperative. Patient remains safe.

## 2011-12-07 NOTE — [facility] Suicide Risk Assessment (Signed)
Suicide Risk Assessment  Admission Assessment     Demographic factors:  Assessment Details Time of Assessment: Admission Information Obtained From: Patient Current Mental Status:  Current Mental Status:  (denies all) Loss Factors:  Loss Factors: Financial problems / change in socioeconomic status Historical Factors:    Risk Reduction Factors:  Risk Reduction Factors: Responsible for children under 35 years of age;Sense of responsibility to family;Living with another person, especially a relative;Positive social support  CLINICAL FACTORS:   Severe Anxiety and/or Agitation Depression:   Anhedonia Comorbid alcohol abuse/dependence Hopelessness Alcohol/Substance Abuse/Dependencies More than one psychiatric diagnosis Previous Psychiatric Diagnoses and Treatments  COGNITIVE FEATURES THAT CONTRIBUTE TO RISK:  None Noted.  Diagnosis:  Axis I:  Polysubstance Dependence - Alcohol, Cocaine, Benzodiazepines and Narcotics.   Substance Induced Mood Disorder.  The patient was seen today and reports the following:   ADL's: Intact.  Sleep: The patient reports to having difficulty sleeping prior to admission but slept well last night.  Appetite: The patient reports that his appetite is decreased but improving.   Mild>(1-10) >Severe  Hopelessness (1-10): 6-7  Depression (1-10): 7  Anxiety (1-10): 8   Suicidal Ideation: The patient adamantly denies any suicidal ideations today.  Plan: No  Intent: No  Means: No   Homicidal Ideation: The patient adamantly denies any homicidal ideations today.  Plan: No  Intent: No.  Means: No   General Appearance/Behavior: The patient was cooperative today with this provider. Eye Contact: Good.  Speech: Appropriate in rate and volume.  Motor Behavior: wnl.  Level of Consciousness: Alert and Oriented x 3.  Mental Status: Alert and Oriented x 3.  Mood: Appears moderate to severely depression today.  Affect: Appears moderately constricted.  Anxiety  Level: Moderate to severe anxiety reported.  Thought Process: wnl.  Thought Content: The patient denies any auditory or visual hallucinations today. He also denies any delusional thinking. Perception:. wnl.  Judgment: Fair.  Insight: Fair.  Cognition: Oriented to person, place and time.   Review of Systems:  Neurological: The patient denies any headaches today. He denies any seizures or dizziness.  G.I.: The patient denies any constipation today or G.I. Upset.  Musculoskeletal: The patient denies any skeletal difficulties.  He reports some muscle aches related to his detox from narcotics.  Time was spent today discussing with the patient his reason for admission.  The patient states that he has been abusing alcohol, cocaine, narcotics and benzodiazepines and feels he is ready for detox from these substances.  The patient reports moderate to severe feelings of sadness, anhedonia and depressed mood and reports moderate to severe anxiety.  He denies any suicidal or homicidal ideations as well as any auditory or visual hallucinations or delusional thinking.  He states he is detoxing well with but with some muscle aches related to detoxing from narcotics.  Treatment Plan Summary:  1. Daily contact with patient to assess and evaluate symptoms and progress in treatment.  2. Medication management  3. The patient will deny suicidal ideations or homicidal ideations for 48 hours prior to discharge and have a depression and anxiety rating of 3 or less. The patient will also deny any auditory or visual hallucinations or delusional thinking.  4. The patient will deny any symptoms of substance withdrawal at time of discharge.   Plan:  1. Will continue the patient on his Librium Detox protocol. 2. Will continue the patient on this Clonidine Detox protocol. 3. Will continue the medication Trazodone at 150 mgs po qhs - prn  for sleep. 4. Laboratory studies reviewed.  5. Will order a TSH, Free T3 and Free T4  to evaluate the patient's thyroid functioning. 6. Will continue to monitor.   SUICIDE RISK:   Minimal: No identifiable suicidal ideation.  Patients presenting with no risk factors but with morbid ruminations; may be classified as minimal risk based on the severity of the depressive symptoms  Kenneth [name] 12/07/2011, 4:21 PM

## 2011-12-07 NOTE — Discharge Planning (Signed)
New patient attended AM group, good participation.  States he is here for help with detox from opiates.  Also admitted to using cocaine.  Was here for detox a year and a half ago.  From here went to [facility].  Remained clean and sober for 6 months by going to meetings and working with his sponsor.  Decided to ask for methadone from his doctor and has been using since then.  At one point got on suboxone through [facility], and has now decided he needs to get off of everything.  Says wife is checking into [facility] for him, but he is undecided on how to proceed post hospital d/c.

## 2011-12-07 NOTE — Progress Notes (Signed)
[facility] Group Notes: (Counselor/Nursing/MHT/Case Management/Adjunct)  12/07/2011 11:00am  Feelings About Diagnosis   Type of Therapy: Group Therapy   Participation Level: Did Not Attend    Kenneth [name] Taree Hayes  12/07/2011 2:27 PM     

## 2011-12-08 LAB — T4, FREE: Free T4: 1.21 ng/dL (ref 0.80–1.80)

## 2011-12-08 LAB — T3, FREE: T3, Free: 3.1 pg/mL (ref 2.3–4.2)

## 2011-12-08 LAB — TSH: TSH: 0.732 u[IU]/mL (ref 0.350–4.500)

## 2011-12-08 NOTE — Progress Notes (Addendum)
D: Patient complains of withdrawal symptoms tonight. Patient also still rating Depression at a 6/10 and anxiety 7/10.  Patient stated "Today was better for me."  Patient was happy that he was able to visit with his wife and children.  He smiled during interview with Clinical research associate.  Patient denies SI/HI and no AVH. Patient attended group tonight. A: Patient given Librium for withdrawal symptoms and Trazodone for sleep.  Staff to monitor Q 15 mins for safety.  R: Patient pleasant and cooperative. Patient remains safe.   2300: Patient has glasses at the bedside that he does not want to wear.  Patient wears contacts and the solution is kept on the med cart for patient use.

## 2011-12-08 NOTE — Progress Notes (Signed)
Pt has remained in bed most of the day today.  He did go to d/c planning this morning but went right back to bed.  He rated his depression a 6 hopelessness a 5 and his anxiety a 8 on his self-inventory.  He denied any S/H ideation or A/V hallucinations.  He has had multiple meds for his symptoms today.  His CIWA has been 8 and 9 today mainly for sweats and generalized pain and discomfort.  He had spoken with someone from [facility] and his has to write an essay to explain why he feels he needs to come and what he expects from them.  This was pt's version.  He stated,"my wife plans to bring me notebook paper to write neatly"  He went on to say he tried the plain paper here and was unable to keep it "neat".  Pt's is visiting with his 35 year old son in dayroom at this time.  Both seemed to be enjoying this time together.

## 2011-12-08 NOTE — Progress Notes (Signed)
[facility] MD Progress Note  12/08/2011 1:48 PM   S/O: Patient seen and evaluated. Chart reviewed. Patient stated that his mood was "good". His affect was mood congruent and euthymic. Looking forward to [employer].  He denied any current thoughts of self injurious behavior, suicidal ideation or homicidal ideation. He denied any significant depressive signs or symptoms at this time. There were no auditory or visual hallucinations, paranoia, delusional thought processes, or mania noted.  Thought process was linear and goal directed.  No psychomotor agitation or retardation was noted. His speech was normal rate, tone and volume. Eye contact was good. Judgment and insight are fair.  Patient has been up and engaged on the unit.  No acute safety concerns reported from team.  Vital Signs:Blood pressure 114/70, pulse 60, temperature 97.9 F (36.6 C), temperature source Oral, resp. rate 16, height 6\' 2"  (1.88 m), weight 90.719 kg (200 lb).  Current Medications:    . chlordiazePOXIDE  25 mg Oral QID   Followed by  . chlordiazePOXIDE  25 mg Oral TID   Followed by  . chlordiazePOXIDE  25 mg Oral BH-qamhs   Followed by  . chlordiazePOXIDE  25 mg Oral Daily  . citalopram  20 mg Oral Daily  . cloNIDine  0.1 mg Oral QID   Followed by  . cloNIDine  0.1 mg Oral BH-qamhs   Followed by  . cloNIDine  0.1 mg Oral QAC breakfast  . multivitamin with minerals  1 tablet Oral Daily  . nicotine  21 mg Transdermal Q0600    Lab Results:  Results for orders placed during the hospital encounter of 12/06/11 (from the past 48 hour(s))  TSH     Status: Normal   Collection Time   12/07/11  8:09 PM      Component Value Range Comment   TSH 0.732  0.350 - 4.500 uIU/mL   T3, FREE     Status: Normal   Collection Time   12/07/11  8:09 PM      Component Value Range Comment   T3, Free 3.1  2.3 - 4.2 pg/mL   T4, FREE     Status: Normal   Collection Time   12/07/11  8:09 PM      Component Value Range Comment   Free T4 1.21   0.80 - 1.80 ng/dL     Physical Findings: CIWA:  CIWA-Ar Total: 8  COWS:  COWS Total Score: 2   A/P: Polysubstance Dependence; Alcohol, Benzodiazepines and Opioid W/D; Substance Induced Mood Disorder; r/o PTSD  Pt seen and evaluated in treatment team.  Reviewed short term and long term goals, medications, current treatment in the hospital and acute/chronic safety.  Pt denied any current thoughts of self harm, suicidal ideation or homicidal ideation.  Contracted for safety on the unit.  No acute issues noted. VS reviewed with team.  Pt agreeable with treatment plan, see orders. Continue current medications as noted above.  Lupe Carney 12/08/2011, 1:48 PM

## 2011-12-08 NOTE — Discharge Planning (Addendum)
12/08/2011 9:51 AM [name]   Case Manager discussed aftercare plans during group.  Pt. Reported he feels better today and slept well.  Pt is scheduled for an interview today with Deniece Portela of [facility] treatment [facility].  CM to follow-up after interview to further discuss discharge plans.  Clarice Pole, LCASA 12/08/2011, 9:54 AM

## 2011-12-08 NOTE — Tx Team (Signed)
12/08/2011 11:00am [name]  Interdisciplinary Treatment Plan Update (Adult) Date: 12/08/2011  Time Reviewed: 1:45 PM  Progress in Treatment: Attending groups: Yes Participating in groups: Yes Taking medication as prescribed: Yes Tolerating medication: Yes Family/Significant other contact made: No Patient understands diagnosis: Yes As evidenced by asking for help with detox and getting into rehab Discussing patient identified problems/goals with staff: Yes  See below Medical problems stabilized or resolved: Yes Denies suicidal/homicidal ideation: Yes In tx team Issues/concerns per patient self-inventory: None identified Other: N/A New problem(s) identified: None Identified  Reason for Continuation of Hospitalization: Depression Medication stabilization Withdrawal symptoms  Interventions implemented related to continuation of hospitalization: librium and clonodine taper, mood stabilization, medication monitoring and adjustment, group therapy and psycho education, safety checks q 15 mins  Additional comments: N/A  Estimated length of stay: 2-3 days  Discharge Plan: Possible transfer to [facility]  New goal(s): N/A Review of initial/current patient goals per problem list:  1. Goal(s): Detox from benzodiazepines and  alcohol   Met: No  Target date: 12-10-11  As evidenced by: completing detox protocol, stable vitals 2. Goal (s): Reduce symptoms associated with Depression and Anxiety   Met: No  Target date: 12-10-11  As evidenced by: improved sleeping patterns, and self report  3. Goal(s):  Identify comprehensive sobriety plan  Met: No  Target date: 12-10-11  As evidenced by:  confirmed appointment with aftercare service provider. Attendees: Patient: Kenneth [name] 12/08/2011 1:45pm  Family:    Physician: Lupe Carney, DO 12/08/2011 1:45 PM  Nursing: Chinita Greenland, RN 12/08/2011 1:45 PM  Case Manager: Richelle Ito, LCSW 12/08/2011 1:45 PM  Counselor:  Ronda Fairly, LCSWA 12/08/2011 1:45 PM   Other:  12/08/2011 1:45 PM   Other:    Other:    Other:    Scribe for Treatment Team: Clarice Pole, Janett Labella 12/08/2011 1:45 PM
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
## 2011-12-08 NOTE — Progress Notes (Signed)
[facility] Group Notes:  (Counselor/Nursing/MHT/Case Management/Adjunct)  12/08/2011 4:34 PM  Type of Therapy:  Group Therapy at 11 and 1:15  Participation Level:  Did Not Attend   Kenneth [name] 12/08/2011, 4:34 PM

## 2011-12-09 MED ORDER — TRAZODONE HCL 150 MG PO TABS
150.0000 mg | ORAL_TABLET | Freq: Every day | ORAL | Status: DC
  Administered 2011-12-09: 150 mg via ORAL
  Filled 2011-12-09 (×3): qty 1

## 2011-12-09 MED ORDER — SERTRALINE HCL 100 MG PO TABS
100.0000 mg | ORAL_TABLET | Freq: Every day | ORAL | Status: DC
  Administered 2011-12-09 – 2011-12-10 (×2): 100 mg via ORAL
  Filled 2011-12-09 (×4): qty 1

## 2011-12-09 NOTE — Progress Notes (Signed)
Psychoeducational Group Note  Date:  12/08/2011  Time:  2135   Group Topic/Focus:  Wrap-Up Group:   The focus of this group is to help patients review their daily goal of treatment and discuss progress on daily workbooks.  Participation Level:  Minimal  Participation Quality:  Appropriate  Affect:  Appropriate  Cognitive:  Appropriate  Insight:  Limited  Engagement in Group:  Limited  Additional Comments:  Pt attended NA group this evening.  Pt participated in serenity prayer.  Aundria Rud, Ilir Mahrt L 12/09/2011, 12:09 AM
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
## 2011-12-09 NOTE — Progress Notes (Signed)
Pt attended discharge planning group and actively participated.  Pt presents with flat affect and depressed mood.  Pt rates depression at a 6 and anxiety at an 8 today.  Pt denies SI.  Pt states his d/c plan is to go to [facility] for further treatment once detox is completed.  Pt states that an intake coordinator with [facility] came yesterday to visit him.  Pt states that he left an application with him and pt filled it out.  SW reviewed the application for completion and faxed it to the intake coordinator.  SW will continue to monitor this referral.  No further needs voiced by pt at this time.  Safety planning and suicide prevention discussed.     Kenneth [name], [place] 12/09/2011  9:33 AM

## 2011-12-09 NOTE — Progress Notes (Signed)
(  D) Patient presents as restless, anxious and irritable. Cooperative but states that he aches all over and is experiencing tremors. CIWA rated at 6 and COWS 5. (A) B/P low and Clonidine dose held this AM. Last BM was 12/06/11, patient offered laxative. Patient also given Tylenol for generalized body aching which he states is from his withdrawal symptoms. Patient encouraged to complete daily self inventory but has not completed as of yet. (R) Patient did get up to go to lunch, has not showered and wants to rest this afternoon.  Joice Lofts RN MS EdS 12/09/2011  12:47 PM

## 2011-12-09 NOTE — Progress Notes (Signed)
[facility] MD Progress Note  12/09/2011 2:57 PM   S/O: Patient seen and evaluated. Chart reviewed. Patient stated that his mood was "ok". His affect was mood congruent and anxious. Looking forward to [facility], may be accepted for Monday.  He denied any current thoughts of self injurious behavior, suicidal ideation or homicidal ideation. He denied any significant depressive signs or symptoms at this time. Reported NMs, anxiety and insomnia. There were no auditory or visual hallucinations, paranoia, delusional thought processes, or mania noted.  Thought process was linear and goal directed.  No psychomotor agitation or retardation was noted. His speech was normal rate, tone and volume. Eye contact was good. Judgment and insight are fair.  Patient has been up and engaged on the unit.  No acute safety concerns reported from team.  Vital Signs:Blood pressure 107/63, pulse 64, temperature 98.4 F (36.9 C), temperature source Oral, resp. rate 18, height 6\' 2"  (1.88 m), weight 90.719 kg (200 lb).  Current Medications:     . chlordiazePOXIDE  25 mg Oral TID   Followed by  . chlordiazePOXIDE  25 mg Oral BH-qamhs   Followed by  . chlordiazePOXIDE  25 mg Oral Daily  . citalopram  20 mg Oral Daily  . cloNIDine  0.1 mg Oral QID   Followed by  . cloNIDine  0.1 mg Oral BH-qamhs   Followed by  . cloNIDine  0.1 mg Oral QAC breakfast  . multivitamin with minerals  1 tablet Oral Daily  . nicotine  21 mg Transdermal Q0600    Lab Results:  Results for orders placed during the hospital encounter of 12/06/11 (from the past 48 hour(s))  TSH     Status: Normal   Collection Time   12/07/11  8:09 PM      Component Value Range Comment   TSH 0.732  0.350 - 4.500 uIU/mL   T3, FREE     Status: Normal   Collection Time   12/07/11  8:09 PM      Component Value Range Comment   T3, Free 3.1  2.3 - 4.2 pg/mL   T4, FREE     Status: Normal   Collection Time   12/07/11  8:09 PM      Component Value Range Comment   Free T4 1.21  0.80 - 1.80 ng/dL     Physical Findings: CIWA:  CIWA-Ar Total: 6  COWS:  COWS Total Score: 5   A/P: Polysubstance Dependence; Alcohol, Benzodiazepines and Opioid W/D; Substance Induced Mood Disorder; PTSD  Decreased efficacy with citalopram for PTSd s/s.  Medication education completed.  Pros, cons, risks, potential side effects and benefits (including no treatment) were discussed with pt.  Pt agreeable with the plan to d/c citalopram and start sertraline with nightly use of trazodone for sleep.  See orders.  Discussed with team.    Lupe Carney 12/09/2011, 2:57 PM

## 2011-12-09 NOTE — Progress Notes (Signed)
[facility] Adult Inpatient Family/Significant Other Collateral Contact  Collateral Contact:  Wife, Jaydyn Bozzo at [phone] has been identified by the patient as the family member/significant other who can provide for collateral information. With written consent from the patient, the family member/significant other has been contacted and reports:   That Patient has been accepted to [facility] program to begin on Monday July 1 and wife would like him to go door to door from here to [employer] explained to pt's wife that we would discuss in treatment team but I could not give her an answer now as to length of stay. Mrs Rapozo misunderstood and feared she would need to take off work tomorrow am to pick up patient. Assurances given to Mrs Donaghue that writer would advocate for pt to discharge closer to Monday admit vs Friday  No further concerns other than relapse were stated for patient from wife.  Writer provided phone number and explanation of services to  [facility] Unit telephone number   Clide Dales 12/09/2011 4:53 PM
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
## 2011-12-10 MED ORDER — TRAZODONE HCL 100 MG PO TABS
200.0000 mg | ORAL_TABLET | Freq: Every day | ORAL | Status: DC
  Administered 2011-12-10: 200 mg via ORAL
  Filled 2011-12-10 (×4): qty 2

## 2011-12-10 MED ORDER — SERTRALINE HCL 50 MG PO TABS
150.0000 mg | ORAL_TABLET | Freq: Every day | ORAL | Status: DC
  Administered 2011-12-11: 150 mg via ORAL
  Filled 2011-12-10 (×3): qty 1

## 2011-12-10 NOTE — Tx Team (Signed)
Interdisciplinary Treatment Plan Update (Adult)  Date:  12/10/2011  Time Reviewed:  10:04 AM   Progress in Treatment: Attending groups: Yes Participating in groups:  Yes Taking medication as prescribed: Yes Tolerating medication:  Yes Family/Significant othe contact made:  Yes Patient understands diagnosis:  Yes Discussing patient identified problems/goals with staff:  Yes Medical problems stabilized or resolved:  Yes Denies suicidal/homicidal ideation: Yes Issues/concerns per patient self-inventory:  None identified Other: N/A  New problem(s) identified: None Identified  Reason for Continuation of Hospitalization: Withdrawal symptoms Other; describe at risk of relaspe if d/c before door to door to treatment  Interventions implemented related to continuation of hospitalization: mood stabilization, medication monitoring and adjustment, group therapy and psycho education, safety checks q 15 mins  Additional comments: N/A  Estimated length of stay: 2 days  Discharge Plan: Pt is scheduled to go to [facility] on Monday for further treatment.    New goal(s): N/A  Review of initial/current patient goals per problem list:    Review of initial/current patient goals per problem list:   1. Goal(s): Detox from benzodiazepines and alcohol   Met: Yes  Target date: 12-10-11   As evidenced by: pt completed detox protocol  2. Goal (s): Reduce symptoms associated with Depression and Anxiety   Met: Yes  Target date: 12-10-11   As evidenced by: pt rates depression and anxiety at a 3 today and reports improved mood.    3. Goal(s): Identify comprehensive sobriety plan   Met: No   Target date: 12-10-11   As evidenced by: pt is scheduled to go to [facility] on Monday.      Attendees: Patient:     Family:     Physician:  Lupe Carney, DO 12/10/2011 10:04 AM   Nursing: Alease Frame, RN 12/10/2011 10:04 AM   Case Manager:  Reyes Ivan, LCSWA 12/10/2011  10:04 AM    Counselor:  Ronda Fairly, LCSWA 12/10/2011  10:04 AM   Other:  Clarice Pole, LCASA 12/10/2011 10:04 AM   Other:     Other:     Other:      Scribe for Treatment Team:   Reyes Ivan 12/10/2011 10:04 AM

## 2011-12-10 NOTE — Progress Notes (Signed)
D: Pt denies any SI. Pt depressed and hopeless. Pt had increased anxiety and agitation earlier. Mood has been labile. Pt initiates minimal contact. A: Support and encouragement offered to pt. Medicated pt's anxiety. Continue Q15 min checks for safety. R: Pt receptive. Affect brighter by end of shift. Pt states "I feel a lot better."  Pt remains safe on the unit.

## 2011-12-10 NOTE — Progress Notes (Signed)
Pt attended discharge planning group and actively participated.  Pt presents with calm mood and affect.  Pt rates depression at a 3 and anxiety at a 5 today.  Pt denies SI.  Pt completed his essay for [facility] and SW faxed it to intake this morning.  Pt is scheduled to go to [facility] on Monday.  [name] is able to transport pt to the program.  No further needs voiced by pt at this time.    Kenneth [name], LCSWA 12/10/2011  9:20 AM

## 2011-12-10 NOTE — Progress Notes (Signed)
[facility] MD Progress Note  12/10/2011 2:33 PM   S/O: Patient seen and evaluated. Chart reviewed. Patient stated that his mood was "ok". His affect was mood congruent and less anxious. Looking forward to [employer].  He denied any current thoughts of self injurious behavior, suicidal ideation or homicidal ideation. He denied any significant depressive signs or symptoms at this time. Reported NMs, anxiety and insomnia slightly improved on new meds. There were no auditory or visual hallucinations, paranoia, delusional thought processes, or mania noted.  Thought process was linear and goal directed.  No psychomotor agitation or retardation was noted. His speech was normal rate, tone and volume. Eye contact was good. Judgment and insight are fair.  Patient has been up and engaged on the unit.  No acute safety concerns reported from team.  Vital Signs:Blood pressure 84/62, pulse 73, temperature 97.3 F (36.3 C), temperature source Oral, resp. rate 16, height 6\' 2"  (1.88 m), weight 90.719 kg (200 lb).  Current Medications:    . chlordiazePOXIDE  25 mg Oral BH-qamhs   Followed by  . chlordiazePOXIDE  25 mg Oral Daily  . cloNIDine  0.1 mg Oral BH-qamhs   Followed by  . cloNIDine  0.1 mg Oral QAC breakfast  . multivitamin with minerals  1 tablet Oral Daily  . nicotine  21 mg Transdermal Q0600  . sertraline  100 mg Oral Daily  . traZODone  150 mg Oral QHS  . DISCONTD: citalopram  20 mg Oral Daily    Lab Results:  No results found for this or any previous visit (from the past 48 hour(s)).  Physical Findings: CIWA:  CIWA-Ar Total: 9  COWS:  COWS Total Score: 5   A/P: Polysubstance Dependence; Alcohol, Benzodiazepines and Opioid W/D; Substance Induced Mood Disorder; PTSD; Insomnia  Accepted to [facility] on Monday, 43m program.  Increased sertraline and trazodone for increased efficacy.  Librium taper compelted today.  D/C planned for tomorrow if stable.  Pt agreeable with  plan.  Lupe Carney 12/10/2011, 2:33 PM

## 2011-12-10 NOTE — Progress Notes (Signed)
[facility] Group Notes:  (Counselor/Nursing/MHT/Case Management/Adjunct)  12/10/2011   Type of Therapy:  Group Therapy at 11:00 Participation Level:  Active  Participation Quality:  Appropriate and Attentive  Affect:  Appropriate  Cognitive:  Appropriate  Insight:  None  Engagement in Group:  Good  Engagement in Therapy:  Good  Modes of Intervention:  Education, Socialization and Support  Summary of Progress/Problems: Kenneth [name] was very attentive to discussion on Post Acute Withdrawal Syndrome (PAWS).  After group he inquired if writer thought [facility] program would be helpful to him as other patients would be going through same experience. Patient given support and encouragement.    Clide Dales 12/10/2011,4:00 PM   [facility] Group Notes:  (Counselor/Nursing/MHT/Case Management/Adjunct)  12/10/2011 4:01 PM   Type of Therapy:  Group Therapy at 1:15  Participation Level:  Did Not Attend  Clide Dales 12/10/2011 4:01 PM

## 2011-12-10 NOTE — Progress Notes (Signed)
D-Pt out in milieu interacting with peers and attending am groups. Did not attend afternoon groups.  Affect,mood, and behavior is appropriate.A-Reports fair sleep,improving appetite, and low energy. Agitation,tremors and chills r/t that patient is relating to withdrawal. Rates depression at 6 and hopelessness at 4.  Denies SI. R-Patient is requesting long term treatment. Compliant with scheduled medications.Continue current POC and evaluate goals. Continue 15' checks for safety.

## 2011-12-10 NOTE — Progress Notes (Signed)
Lying quietly ion bed with eyes closed.  Safety maintained via Q 15 minute safety.

## 2011-12-11 DIAGNOSIS — F1120 Opioid dependence, uncomplicated: Secondary | ICD-10-CM

## 2011-12-11 DIAGNOSIS — F1994 Other psychoactive substance use, unspecified with psychoactive substance-induced mood disorder: Secondary | ICD-10-CM

## 2011-12-11 MED ORDER — NICOTINE 14 MG/24HR TD PT24
14.0000 mg | MEDICATED_PATCH | Freq: Every day | TRANSDERMAL | Status: DC
  Filled 2011-12-11: qty 14

## 2011-12-11 MED ORDER — SERTRALINE HCL 50 MG PO TABS: 150.0000 mg | ORAL_TABLET | Freq: Every day | ORAL | Status: DC

## 2011-12-11 MED ORDER — SERTRALINE HCL 50 MG PO TABS
150.0000 mg | ORAL_TABLET | Freq: Every day | ORAL | Status: DC
  Filled 2011-12-11: qty 42

## 2011-12-11 MED ORDER — TRAZODONE HCL 100 MG PO TABS
200.0000 mg | ORAL_TABLET | Freq: Every day | ORAL | Status: DC
  Filled 2011-12-11: qty 28

## 2011-12-11 MED ORDER — TRAZODONE HCL 100 MG PO TABS: 200.0000 mg | ORAL_TABLET | Freq: Every day | ORAL | Status: DC

## 2011-12-11 NOTE — Progress Notes (Signed)
[facility] Case Management Discharge Plan:  Will you be returning to the same living situation after discharge: yes return home At discharge, do you have transportation home?:yes wife will transport Do you have the ability to pay for your medications:Yes,    Interagency Information:     Release of information consent forms completed and in the chart;  Patient's signature needed at discharge.  Patient to Follow up at:  Follow-up Information    Follow up with [facility] on 12/13/2011. (Scheduled admission at)    Contact information:   [address] [place], [place] [address] [phone]          Patient denies SI/HI:   Yes,      Safety Planning and Suicide Prevention discussed:  Yes,  with pt. denies all S/I and H/I  Barrier to discharge identified:No.  Summary and Recommendations: Pt was cleared for D/C and denies any and all S/I and H/I.  Pt was given 12 step meeting schedules and agreed to attend meetings this weekend. [facility] 12/11/2011, 11:44 AM

## 2011-12-11 NOTE — [facility] Suicide Risk Assessment (Signed)
Suicide Risk Assessment  Discharge Assessment     Demographic factors:  Male;Caucasian;Low socioeconomic status    Current Mental Status Per Nursing Assessment::   On Admission:   (denies all) At Discharge:   denies  Current Mental Status Per Physician: He denied any current thoughts of self injurious behavior, suicidal ideation or homicidal ideation. He denied any significant depressive signs or symptoms at this time. Reported NMs, anxiety and insomnia slightly improved on new meds. There were no auditory or visual hallucinations, paranoia, delusional thought processes, or mania noted. Thought process was linear and goal directed. No psychomotor agitation or retardation was noted. His speech was normal rate, tone and volume. Eye contact was good. Judgment and insight are fair. Patient has been up and engaged on the unit. No acute safety concerns reported from team.  Loss Factors: Financial problems / change in socioeconomic status  Historical Factors:  no attemtps  Risk Reduction Factors:    family support, has a job  Continued Clinical Symptoms:  Alcohol/Substance Abuse/Dependencies  Discharge Diagnoses:   AXIS I:  poylysubsatnce dep AXIS II:  Deferred AXIS III:   Past Medical History  Diagnosis Date  . PTSD (post-traumatic stress disorder)    AXIS IV:  other psychosocial or environmental problems AXIS V:  51-60 moderate symptoms  Cognitive Features That Contribute To Risk:  Closed-mindedness    Suicide Risk:  Mild:  Suicidal ideation of limited frequency, intensity, duration, and specificity.  There are no identifiable plans, no associated intent, mild dysphoria and related symptoms, good self-control (both objective and subjective assessment), few other risk factors, and identifiable protective factors, including available and accessible social support.  Plan Of Care/Follow-up recommendations:   Pt is scheduled to go to [facility] on Monday. [name] is able to  transport pt to the program.   Wonda Cerise 12/11/2011, 11:27 AM

## 2011-12-11 NOTE — Progress Notes (Signed)
Patient ID: Kenneth [name], male   DOB: May 07, 1976, 36 y.o.   MRN: [id number] Pt. attended and participated in aftercare planning group. Pt. verbally accepted information on suicide prevention, warning signs to look for with suicide and crisis line numbers to use. The pt. agreed to call crisis line numbers if having warning signs or having thoughts of suicide. Pt. listed their current anxiety level as 2 and depression level as 2 on a scale of 1 to 10 with 10 being the high.

## 2011-12-11 NOTE — Progress Notes (Signed)
D: Pt in bed resting with eyes closed. Respirations even and unlabored. Pt appears to be in no signs of distress at this time. A: Q15min checks remains for this pt. R: Pt remains safe at this time.   

## 2011-12-12 NOTE — Discharge Summary (Signed)
Physician Discharge Summary Note  Patient:  Kenneth [name] is an 36 y.o., male MRN:  [id number] DOB:  1976-04-13 Patient phone:  [phone] (home)  Patient address:   [address] [place] [address],   Date of Admission:  12/06/2011 Date of Discharge: 12/11/11  Reason for Admission: Drug detoxification  Discharge Diagnoses: Principal Problem:  *Polysubstance (including opioids) dependence with physiological dependence Active Problems:  Substance induced mood disorder   Axis Diagnosis:   AXIS I:  Substance Induced Mood Disorder and Polysubstance (including opioids) dependence with physiological dependence AXIS II:  Deferred AXIS III:   Past Medical History  Diagnosis Date  . PTSD (post-traumatic stress disorder)    AXIS IV:  other psychosocial or environmental problems and Polysubstance abuse/dependence with physiological dependence AXIS V:  67  Level of Care:  [facility]  Hospital Course:  This is a voluntary admission for this 36 yr old MWM who goes by Kenneth [name]. He presented to the ED requesting detox from suboxone which he has been on for over a year, Benzodiazepines, Klonopin 1mg  which he has been on for over a year, and alcohol in the form of beer which he notes that he has been drinking a 6 pack a day for years. Kenneth [name] states that he has been binging on crack cocaine for at least a month using at least 50$ a day.  Prior to patient admission to this hospital, Kenneth [name] was cautioned and informed that [facility] detoxification regimen protocol does not cover Suboxen. However, he was assured that since his UDS test indicated (+) benzodiazepines, he will be able to receive librium protocol for benzodiazepine detox and since his main goal is to get off of Suboxen, he will have to stop that cold [place]. Patient did agreed with the treatment plan.  He did receive Librium protocol treatment regimen. He was also enrolled in group counseling sessions and activities, which he participated as  recommended. He also attended the AA/NA meetings being offered and held on this unit. He received Zoloft 50 mg for depressive mood symptoms and Trazodone 100 mg for sleep. He tolerated his treatment regimen without any significant adverse effects and or reactions.     Patient attended treatment team meeting this am and met with the team. His symptoms, treatment plan, response to treatment and discharge plans discussed. The discharge plan discussion was focused on continuation of substance abuse treatment to maintain sobriety. Patient agreed with the plan to [facility]up at [facility]. He is scheduled for an admission assessment on 12/13/11. The [facility] contact information was provided for patient.   Upon discharge, patient adamantly denies suicidal, homicidal ideations, auditory, visual hallucinations, delusional thinking and or withdrawal symptoms. He left [facility] with all personal belongings via family transport. He was in no apparent distress.     Consults:  None  Significant Diagnostic Studies:  labs: CBC with diff, CMP, UDS, Toxicology  Discharge Vitals:   Blood pressure 90/64, pulse 80, temperature 97.4 F (36.3 C), temperature source Oral, resp. rate 16, height 6\' 2"  (1.88 m), weight 90.719 kg (200 lb).  Mental Status Exam: See Mental Status Examination and Suicide Risk Assessment completed by Attending Physician prior to discharge.  Discharge destination:  Other:  [facility]  Is patient on multiple antipsychotic therapies at discharge:  No   Has Patient had three or more failed trials of antipsychotic monotherapy by history:  No  Recommended Plan for Multiple Antipsychotic Therapies: NA   Medication List  As of 12/12/2011 12:15  PM   STOP taking these medications         buprenorphine-naloxone 8-2 MG Subl      citalopram 20 MG tablet      clonazePAM 1 MG tablet         TAKE these medications      Indication    sertraline 50 MG tablet   Commonly known as:  ZOLOFT   Take 3 tablets (150 mg total) by mouth daily. For depression       traZODone 100 MG tablet   Commonly known as: DESYREL   Take 2 tablets (200 mg total) by mouth at bedtime. For sleep            Follow-up Information    Follow up with [facility] on 12/13/2011. (Scheduled admission at)    Contact information:   [address] [place], [place] [address] [phone]          Follow-up recommendations:  Activity:  as tolerated Other:  Keep all scheduled follow-up appointments as recommended.    Comments:  Take all your medications as prescribed by your mental healthcare provider. Report any adverse effects and or reactions from your medicines to your outpatient provider promptly. Patient is instructed and cautioned to not engage in alcohol and or illegal drug use while on prescription medicines. In the event of worsening symptoms, patient is instructed to call the crisis hotline, 911 and or go to the nearest ED for appropriate evaluation and treatment of symptoms. Follow-up with your primary care provider for your other medical issues, concerns and or health care needs.     SignedArmandina Stammer I 12/12/2011, 12:15 PM

## 2011-12-13 NOTE — Progress Notes (Signed)
Patient Discharge Instructions: [facility].  Wandra Scot, 12/13/2011, 4:27 PM

## 2011-12-15 NOTE — Progress Notes (Deleted)
Patient Discharge Instructions: No consent for [facility].  Wandra Scot, 12/15/2011, 4:07 PM

## 2011-12-22 ENCOUNTER — Emergency Department (INDEPENDENT_AMBULATORY_CARE_PROVIDER_SITE_OTHER): Payer: PRIVATE HEALTH INSURANCE

## 2011-12-22 ENCOUNTER — Emergency Department (INDEPENDENT_AMBULATORY_CARE_PROVIDER_SITE_OTHER)
Admission: EM | Admit: 2011-12-22 | Discharge: 2011-12-22 | Disposition: A | Discharge status: Home / Self Care | Payer: PRIVATE HEALTH INSURANCE | Source: Home / Self Care

## 2011-12-22 ENCOUNTER — Encounter (HOSPITAL_COMMUNITY): Payer: Self-pay | Admitting: Emergency Medicine

## 2011-12-22 DIAGNOSIS — S6991XA Unspecified injury of right wrist, hand and finger(s), initial encounter: Secondary | ICD-10-CM

## 2011-12-22 DIAGNOSIS — S6990XA Unspecified injury of unspecified wrist, hand and finger(s), initial encounter: Secondary | ICD-10-CM

## 2011-12-22 HISTORY — DX: Opioid abuse, uncomplicated: F11.10

## 2011-12-22 MED ORDER — IBUPROFEN 800 MG PO TABS
ORAL_TABLET | ORAL | Status: AC
  Filled 2011-12-22: qty 1

## 2011-12-22 MED ORDER — IBUPROFEN 800 MG PO TABS: 800.0000 mg | ORAL_TABLET | Freq: Three times a day (TID) | ORAL | Status: AC | PRN

## 2011-12-22 MED ORDER — IBUPROFEN 800 MG PO TABS
800.0000 mg | ORAL_TABLET | ORAL | Status: AC
  Administered 2011-12-22: 800 mg via ORAL

## 2011-12-22 NOTE — ED Notes (Addendum)
Right hand injury yesterday, playing basketball.  Injury to middle and ring fingers, visibly swollen, ring finger painful able to move middle finger, unable to move ring finger.  Patient reports jamming fingers into ball.  Patient also requests NO narcotics.  Patient has a history of addiction and does not want narcotics

## 2011-12-22 NOTE — ED Provider Notes (Signed)
History     CSN: [id number]  Arrival date & time 12/22/11  1425   First MD Initiated Contact with Patient 12/22/11 1610     Chief Complaint  Patient presents with  . Hand Pain   HPI  Patient presents with RT hand injury that occurred yesterday afternoon while playing basketball.  Patient says he jammed his fingers into basketball while trying to catch it.  His index and middle finger became swollen and painful immediately.  Pain is localized to fingers, does not radiate to hand, wrist, or arm.  Denies any numbness/tingling sensation.  Pain is described as sharp and throbbing.  He took one Motrin yesterday which helped relieve pain. He is able to extend fingers at joints, but has difficulty flexing or making a fist due to  Of note, patient has a history of narcotic abuse and is not to take any narcotics while in a rehab program.    Denies any associated fever, chills, NS, nausea/vomiting.  Past Medical History  Diagnosis Date  . PTSD (post-traumatic stress disorder)   . Narcotic abuse     Past Surgical History  Procedure Date  . Tonsillectomy     No family history on file.  History  Substance Use Topics  . Smoking status: Former Smoker -- 0.5 packs/day for .5 years    Types: Cigarettes  . Smokeless tobacco: Current User    Types: Snuff  . Alcohol Use: No     six pack a day    Review of Systems  Per HPI  Allergies  Review of patient's allergies indicates no known allergies.  Home Medications   Current Outpatient Rx  Name Route Sig Dispense Refill  . SERTRALINE HCL 50 MG PO TABS Oral Take 3 tablets (150 mg total) by mouth daily. For depression 90 tablet 0  . TRAZODONE HCL 100 MG PO TABS Oral Take 2 tablets (200 mg total) by mouth at bedtime. For sleep 60 tablet 0    BP 128/64  Pulse 82  Temp 98.3 F (36.8 C) (Oral)  Resp 18  SpO2 99%  Physical Exam  Constitutional: No distress.  Musculoskeletal:       Right hand: He exhibits decreased range of motion,  tenderness, deformity and swelling. He exhibits normal capillary refill. normal sensation noted. Decreased strength noted. He exhibits finger abduction. He exhibits no thumb/finger opposition.     ED Course  Procedures (including critical care time)  Labs Reviewed - No data to display Dg Hand Complete Right  12/22/2011  *RADIOLOGY REPORT*  Clinical Data: Hand injury during basketball.  RIGHT HAND - COMPLETE 3+ VIEW  Comparison: 02/18/2007  Findings: There is an old healed deformity involving the fifth metacarpal bone.  There are no acute fractures or subluxations identified at this time.  No radiopaque foreign bodies or soft tissue calcifications.  IMPRESSION:  1.  No acute bony abnormalities.  Original Report Authenticated By: Rosealee Albee, M.D.     MDM  Plan to order X-ray hand to rule out fracture.  He will need follow up with Hand Surgeon within next few days.  Motrin for pain once now.  ASSESSMENT: 1. Injury to Right Hand  PLAN:  Reviewed results of hand X-ray with Dr. Lorenz Coaster and with patient.  Plan to splint both index and middle finger in 30 degree flexed position for one week.  Patient to follow up with Hand Surgeon.  Will give Rx Ibuprofen 800 mg Q8 hr as needed for pain.  Barnabas Lister, MD 12/22/11 [phone]

## 2011-12-22 NOTE — ED Provider Notes (Signed)
Medical screening examination/treatment/procedure(s) were performed by a resident physician and as supervising physician I was immediately available for consultation/collaboration.  Additionally, I saw the patient independently, verified the history, examined the patient and discussed the treatment plan with the resident.  Leslee Home, M.D.    Reuben Likes, MD 12/22/11 2111

## 2011-12-22 NOTE — ED Notes (Signed)
Discharge delay secondary to transferring patient.  Explained delay.

## 2011-12-22 NOTE — ED Notes (Signed)
Splint being placed

## 2011-12-22 NOTE — ED Notes (Signed)
Provided ice pack

## 2012-06-22 ENCOUNTER — Other Ambulatory Visit: Payer: Self-pay | Admitting: Surgery

## 2012-06-22 LAB — BASIC METABOLIC PANEL
Anion Gap: 6 — ABNORMAL LOW (ref 7–16)
Calcium, Total: 8.6 mg/dL (ref 8.5–10.1)
Creatinine: 1.13 mg/dL (ref 0.60–1.30)
EGFR (African American): >60
Glucose: 99 mg/dL (ref 65–99)
Osmolality: 277 (ref 275–301)
Potassium: 3.9 mmol/L (ref 3.5–5.1)
Sodium: 139 mmol/L (ref 136–145)

## 2012-06-22 LAB — CBC WITH DIFFERENTIAL/PLATELET
Basophil %: 0.7 %
Eosinophil #: 0.1 10*3/uL (ref 0.0–0.7)
HGB: 14.7 g/dL (ref 13.0–18.0)
Lymphocyte %: 27.2 %
MCHC: 33.3 g/dL (ref 32.0–36.0)
Neutrophil #: 2.6 10*3/uL (ref 1.4–6.5)
Neutrophil %: 59 %
RBC: 4.99 10*6/uL (ref 4.40–5.90)
RDW: 13.3 % (ref 11.5–14.5)
WBC: 4.4 10*3/uL (ref 3.8–10.6)

## 2012-06-22 LAB — HEPATIC FUNCTION PANEL A (ARMC)
Albumin: 4.1 g/dL (ref 3.4–5.0)
SGOT(AST): 19 U/L (ref 15–37)
Total Protein: 7.9 g/dL (ref 6.4–8.2)

## 2012-06-23 ENCOUNTER — Ambulatory Visit: Payer: Self-pay | Admitting: Surgery

## 2012-06-26 ENCOUNTER — Ambulatory Visit: Payer: Self-pay | Admitting: Surgery

## 2012-07-03 ENCOUNTER — Ambulatory Visit: Payer: Self-pay | Admitting: Surgery

## 2013-04-10 ENCOUNTER — Ambulatory Visit: Payer: Self-pay | Admitting: Gastroenterology

## 2015-05-16 ENCOUNTER — Ambulatory Visit: Payer: Self-pay | Admitting: Internal Medicine

## 2015-05-16 DIAGNOSIS — Z0289 Encounter for other administrative examinations: Secondary | ICD-10-CM

## 2015-05-20 ENCOUNTER — Encounter: Payer: Self-pay | Admitting: Internal Medicine

## 2015-05-20 ENCOUNTER — Ambulatory Visit (INDEPENDENT_AMBULATORY_CARE_PROVIDER_SITE_OTHER): Payer: 59 | Admitting: Internal Medicine

## 2015-05-20 ENCOUNTER — Encounter (INDEPENDENT_AMBULATORY_CARE_PROVIDER_SITE_OTHER): Payer: Self-pay

## 2015-05-20 VITALS — BP 128/78 | HR 73 | Temp 98.1°F | Ht 72.5 in | Wt 176.0 lb

## 2015-05-20 DIAGNOSIS — L03115 Cellulitis of right lower limb: Secondary | ICD-10-CM | POA: Diagnosis not present

## 2015-05-20 DIAGNOSIS — R51 Headache: Secondary | ICD-10-CM

## 2015-05-20 DIAGNOSIS — Z23 Encounter for immunization: Secondary | ICD-10-CM

## 2015-05-20 DIAGNOSIS — F1310 Sedative, hypnotic or anxiolytic abuse, uncomplicated: Secondary | ICD-10-CM

## 2015-05-20 DIAGNOSIS — F1111 Opioid abuse, in remission: Secondary | ICD-10-CM | POA: Insufficient documentation

## 2015-05-20 DIAGNOSIS — F4310 Post-traumatic stress disorder, unspecified: Secondary | ICD-10-CM | POA: Diagnosis not present

## 2015-05-20 DIAGNOSIS — R519 Headache, unspecified: Secondary | ICD-10-CM | POA: Insufficient documentation

## 2015-05-20 MED ORDER — CEPHALEXIN 500 MG PO CAPS: 500.0000 mg | ORAL_CAPSULE | Freq: Three times a day (TID) | ORAL | Status: DC

## 2015-05-20 NOTE — Assessment & Plan Note (Signed)
He has finished Suboxone Narcotic free per his report

## 2015-05-20 NOTE — Assessment & Plan Note (Signed)
Likely tension related Advised him to take Ibuprofen as needed

## 2015-05-20 NOTE — Progress Notes (Signed)
HPI  Pt presents to the clinic today to establish care. He has not had a PCP in many years.  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 The tree fell and killed his coworkers. He started having panic attacks. He did see a psychiatrist for a while. He reports he is doing well now though.  History of narcotic abuse: He was addicted to [employer]. He was on Suboxone for 6 months and now is narcotic free.  Frequent headaches: He reports this occurs this occurs 2-3 x months and last for 2-3 days. The pain starts in the back of his head. He does feel muscle tension in his neck. He describes the pain as throbbing. He does see floaters. He is sensitive to light and sound. He denies nausea or vomiting. He does not take anything OTC but reports he just deals with it.  He also reports right knee pain, swelling and redness. This occurred 4 days ago, after he scraped his knee on a tree. He denies any drainage from the area. He did take 4 days worth of leftover antibiotics and has noticed some improvement but the pain and redness continues.  Flu: wants one today Tetanus: within the last 5years Dentist: yearly  Past Medical History  Diagnosis Date  . PTSD (post-traumatic stress disorder)   . Narcotic abuse   . Frequent headaches     No current outpatient prescriptions on file.   No current facility-administered medications for this visit.    No Known Allergies  Family History  Problem Relation Age of Onset  . Miscarriages / [place] Brother   . Schizophrenia Brother     Social History   Social History  . Marital Status: Married    Spouse Name: N/A  . Number of Children: N/A  . Years of Education: N/A   Occupational History  . Not on file.   Social History Main Topics  . Smoking status: Current Some Day Smoker -- 0.25 packs/day for .5 years    Types: Cigarettes  . Smokeless tobacco: Current User    Types: Snuff  . Alcohol Use: No  . Drug Use:  No  . Sexual Activity: Yes    Birth Control/ Protection: None   Other Topics Concern  . Not on file   Social History Narrative    ROS:  Constitutional: Denies fever, malaise, fatigue, headache or abrupt weight changes.  HEENT: Denies eye pain, eye redness, ear pain, ringing in the ears, wax buildup, runny nose, nasal congestion, bloody nose, or sore throat. Respiratory: Denies difficulty breathing, shortness of breath, cough or sputum production.   Cardiovascular: Denies chest pain, chest tightness, palpitations or swelling in the hands or feet.  Gastrointestinal: Denies abdominal pain, bloating, constipation, diarrhea or blood in the stool.  GU: Denies frequency, urgency, pain with urination, blood in urine, odor or discharge. Musculoskeletal: Pt reports right knee pain and swelling. Denies decrease in range of motion, difficulty with gait, muscle pain.  Skin: Pt reports redness of right knee. Denies rashes, lesions or ulcercations.  Neurological: Denies dizziness, difficulty with memory, difficulty with speech or problems with balance and coordination.  Psych: Pt reports anxiety. Denies depression, SI/HI.  No other specific complaints in a complete review of systems (except as listed in HPI above).  PE:  BP 128/78 mmHg  Pulse 73  Temp(Src) 98.1 F (36.7 C) (Oral)  Ht 6' 0.5" (1.842 m)  Wt 176 lb (79.833 kg)  BMI 23.53  kg/m2  SpO2 97%  Wt Readings from Last 3 Encounters:  05/20/15 176 lb (79.833 kg)  12/06/11 200 lb (90.719 kg)  12/06/11 210 lb (95.255 kg)    General: Appears his stated age, well developed, well nourished in NAD. Skin: Abrasion noted of right knee. Surrounding cellulitis and warmth noted. Cardiovascular: Normal rate and rhythm. S1,S2 noted.  No murmur, rubs or gallops noted. . Pulmonary/Chest: Normal effort and positive vesicular breath sounds. No respiratory distress. No wheezes, rales or ronchi noted.  Musculoskeletal: Normal flexion, extension of  right knee. He has pain with palpation of the right knee. Mild swelling noted. No difficulty with gait.  Neurological: Alert and oriented.  Psychiatric: Mood and affect normal. Behavior is normal. Judgment and thought content normal.     BMET    Component Value Date/Time   NA 139 06/22/2012 1524   NA 136 12/06/2011 0221   K 3.9 06/22/2012 1524   K 4.1 12/06/2011 0221   CL 105 06/22/2012 1524   CL 99 12/06/2011 0221   CO2 28 06/22/2012 1524   CO2 28 12/06/2011 0221   GLUCOSE 99 06/22/2012 1524   GLUCOSE 114* 12/06/2011 0221   BUN 12 06/22/2012 1524   BUN 11 12/06/2011 0221   CREATININE 1.13 06/22/2012 1524   CREATININE 1.01 12/06/2011 0221   CALCIUM 8.6 06/22/2012 1524   CALCIUM 9.6 12/06/2011 0221   GFRNONAA >60 06/22/2012 1524   GFRNONAA >90 12/06/2011 0221   GFRAA >60 06/22/2012 1524   GFRAA >90 12/06/2011 0221    Lipid Panel  No results found for: CHOL, TRIG, HDL, CHOLHDL, VLDL, LDLCALC  CBC    Component Value Date/Time   WBC 4.4 06/22/2012 1524   WBC 6.2 12/06/2011 0221   RBC 4.99 06/22/2012 1524   RBC 4.85 12/06/2011 0221   HGB 14.7 06/22/2012 1524   HGB 14.5 12/06/2011 0221   HCT 44.2 06/22/2012 1524   HCT 42.5 12/06/2011 0221   PLT 261 06/22/2012 1524   PLT 294 12/06/2011 0221   MCV 89 06/22/2012 1524   MCV 87.6 12/06/2011 0221   MCH 29.4 06/22/2012 1524   MCH 29.9 12/06/2011 0221   MCHC 33.3 06/22/2012 1524   MCHC 34.1 12/06/2011 0221   RDW 13.3 06/22/2012 1524   RDW 14.2 12/06/2011 0221   LYMPHSABS 1.2 06/22/2012 1524   MONOABS 0.5 06/22/2012 1524   EOSABS 0.1 06/22/2012 1524   BASOSABS 0.0 06/22/2012 1524    Hgb A1C No results found for: HGBA1C   Assessment and Plan:  Cellulitis of right knee:  Ice and elevation will help with pain and swelling Ibuprofen as needed for pain eRx for Keflex TID x 7 days  Make an appt for your annual exam

## 2015-05-20 NOTE — Patient Instructions (Signed)

## 2015-05-20 NOTE — Addendum Note (Signed)
Addended by: Roena Malady on: 05/20/2015 02:45 PM   Modules accepted: Orders

## 2015-05-20 NOTE — Assessment & Plan Note (Signed)
Currently stable. Will continue to monitor.

## 2015-05-21 ENCOUNTER — Telehealth: Payer: Self-pay | Admitting: Internal Medicine

## 2015-05-21 NOTE — Telephone Encounter (Signed)
Patient returned Melanie's call. °

## 2015-06-20 ENCOUNTER — Encounter: Payer: Self-pay | Admitting: Internal Medicine

## 2015-06-20 DIAGNOSIS — F1120 Opioid dependence, uncomplicated: Secondary | ICD-10-CM | POA: Diagnosis not present

## 2015-06-23 ENCOUNTER — Ambulatory Visit (INDEPENDENT_AMBULATORY_CARE_PROVIDER_SITE_OTHER)
Admission: RE | Admit: 2015-06-23 | Discharge: 2015-06-23 | Disposition: A | Discharge status: Home / Self Care | Payer: 59 | Source: Ambulatory Visit | Attending: Internal Medicine | Admitting: Internal Medicine

## 2015-06-23 ENCOUNTER — Encounter: Payer: Self-pay | Admitting: Internal Medicine

## 2015-06-23 ENCOUNTER — Ambulatory Visit (INDEPENDENT_AMBULATORY_CARE_PROVIDER_SITE_OTHER): Payer: 59 | Admitting: Internal Medicine

## 2015-06-23 VITALS — BP 124/82 | HR 76 | Temp 98.5°F | Ht 72.5 in | Wt 180.0 lb

## 2015-06-23 DIAGNOSIS — M25561 Pain in right knee: Secondary | ICD-10-CM

## 2015-06-23 DIAGNOSIS — Z0000 Encounter for general adult medical examination without abnormal findings: Secondary | ICD-10-CM

## 2015-06-23 NOTE — Patient Instructions (Signed)

## 2015-06-23 NOTE — Progress Notes (Signed)
Subjective:    Patient ID: Kenneth [name], male    DOB: 01/30/1976, 40 y.o.   MRN: [id number]  HPI  Pt presents to the clinic today for his annual exam.  Flu: 05/2015 Tetanus: within the past 5 years. Dentist: yearly  Diet: He does eat meat. He consumes fruits and veggies a few days per week. He does consume some fried foods. He drinks Gatorade and Soda. He does drinks some water. Exercise: None  He also wants to followup his right knee. He was treated for a right knee cellulitis with Keflex x 10 days. He has noticed improvement in the redness, but he reports the right knee is still swollen. It is not very painful. He has no history of gout that he is aware. He has not tried anything OTC.  Review of Systems      Past Medical History  Diagnosis Date  . PTSD (post-traumatic stress disorder)   . Narcotic abuse   . Frequent headaches     Current Outpatient Prescriptions  Medication Sig Dispense Refill  . methadone (DOLOPHINE) 1 mg/ml oral solution Take 40 mg/kg by mouth daily.     No current facility-administered medications for this visit.    No Known Allergies  Family History  Problem Relation Age of Onset  . Miscarriages / [place] Brother   . Schizophrenia Brother     Social History   Social History  . Marital Status: Married    Spouse Name: N/A  . Number of Children: N/A  . Years of Education: N/A   Occupational History  . Not on file.   Social History Main Topics  . Smoking status: Current Some Day Smoker -- 0.25 packs/day for .5 years    Types: Cigarettes  . Smokeless tobacco: Current User    Types: Snuff  . Alcohol Use: No  . Drug Use: No  . Sexual Activity: Yes    Birth Control/ Protection: None   Other Topics Concern  . Not on file   Social History Narrative     Constitutional: Denies fever, malaise, fatigue, headache or abrupt weight changes.  HEENT: Denies eye pain, eye redness, ear pain, ringing in the ears, wax buildup, runny nose,  nasal congestion, bloody nose, or sore throat. Respiratory: Denies difficulty breathing, shortness of breath, cough or sputum production.   Cardiovascular: Denies chest pain, chest tightness, palpitations or swelling in the hands or feet.  Gastrointestinal: Pt reports occasional reflux. Denies abdominal pain, bloating, constipation, diarrhea or blood in the stool.  GU: Denies urgency, frequency, pain with urination, burning sensation, blood in urine, odor or discharge. Musculoskeletal: Pt reports right knee swelling. Denies decrease in range of motion, difficulty with gait, muscle pain or joint pain.  Skin: Denies redness, rashes, lesions or ulcercations.  Neurological: Denies dizziness, difficulty with memory, difficulty with speech or problems with balance and coordination.  Psych: Denies anxiety, depression, SI/HI.  No other specific complaints in a complete review of systems (except as listed in HPI above).   Objective:   Physical Exam   BP 124/82 mmHg  Pulse 76  Temp(Src) 98.5 F (36.9 C) (Oral)  Ht 6' 0.5" (1.842 m)  Wt 180 lb (81.647 kg)  BMI 24.06 kg/m2  SpO2 98% Wt Readings from Last 3 Encounters:  06/23/15 180 lb (81.647 kg)  05/20/15 176 lb (79.833 kg)  12/06/11 200 lb (90.719 kg)    General: Appears his stated age, well developed, well nourished in NAD. Skin: Warm, dry and intact. No rashes,  lesions or ulcerations noted. HEENT: Head: normal shape and size; Eyes: sclera white, no icterus, conjunctiva pink, PERRLA and EOMs intact; Ears: Bilateral cerumen impaction; Throat/Mouth: Teeth present, mucosa pink and moist, no exudate, lesions or ulcerations noted.  Neck:  Neck supple, trachea midline. No masses, lumps or thyromegaly present.  Cardiovascular: Normal rate and rhythm. S1,S2 noted.  No murmur, rubs or gallops noted. No JVD or BLE edema. No carotid bruits noted. Pulmonary/Chest: Normal effort and positive vesicular breath sounds. No respiratory distress. No wheezes,  rales or ronchi noted.  Abdomen: Soft and nontender. Normal bowel sounds. No distention or masses noted. Liver, spleen and kidneys non palpable. Musculoskeletal: Normal flexion and extension of the right knee. Pain with palpation of the knee cap. Pain with palpation of the medial joint line. No signs of joint swelling. No difficulty with gait.  Neurological: Alert and oriented. Cranial nerves II-XII grossly intact. Coordination normal.  Psychiatric: Mood and affect normal. Behavior is normal. Judgment and thought content normal.     BMET    Component Value Date/Time   NA 139 06/22/2012 1524   NA 136 12/06/2011 0221   K 3.9 06/22/2012 1524   K 4.1 12/06/2011 0221   CL 105 06/22/2012 1524   CL 99 12/06/2011 0221   CO2 28 06/22/2012 1524   CO2 28 12/06/2011 0221   GLUCOSE 99 06/22/2012 1524   GLUCOSE 114* 12/06/2011 0221   BUN 12 06/22/2012 1524   BUN 11 12/06/2011 0221   CREATININE 1.13 06/22/2012 1524   CREATININE 1.01 12/06/2011 0221   CALCIUM 8.6 06/22/2012 1524   CALCIUM 9.6 12/06/2011 0221   GFRNONAA >60 06/22/2012 1524   GFRNONAA >90 12/06/2011 0221   GFRAA >60 06/22/2012 1524   GFRAA >90 12/06/2011 0221    Lipid Panel  No results found for: CHOL, TRIG, HDL, CHOLHDL, VLDL, LDLCALC  CBC    Component Value Date/Time   WBC 4.4 06/22/2012 1524   WBC 6.2 12/06/2011 0221   RBC 4.99 06/22/2012 1524   RBC 4.85 12/06/2011 0221   HGB 14.7 06/22/2012 1524   HGB 14.5 12/06/2011 0221   HCT 44.2 06/22/2012 1524   HCT 42.5 12/06/2011 0221   PLT 261 06/22/2012 1524   PLT 294 12/06/2011 0221   MCV 89 06/22/2012 1524   MCV 87.6 12/06/2011 0221   MCH 29.4 06/22/2012 1524   MCH 29.9 12/06/2011 0221   MCHC 33.3 06/22/2012 1524   MCHC 34.1 12/06/2011 0221   RDW 13.3 06/22/2012 1524   RDW 14.2 12/06/2011 0221   LYMPHSABS 1.2 06/22/2012 1524   MONOABS 0.5 06/22/2012 1524   EOSABS 0.1 06/22/2012 1524   BASOSABS 0.0 06/22/2012 1524    Hgb A1C No results found for:  HGBA1C      Assessment & Plan:   Prevent Health Maintenance:  Flu, tetanus UTD Encouraged him to continue to see a dentist at least annually CBC, CMET, Lipid and HIV today Encouraged him to consume a healthy diet and start an exercise regimen  Right Knee pain:  Ongoing Will obtain xray of right knee today Ibuprofen as needed for inflammation and pain Ice as needed for swelling  RTC in 1 year or sooner if needed

## 2015-06-24 LAB — CBC
HEMATOCRIT: 42 % (ref 39.0–52.0)
HEMOGLOBIN: 13.8 g/dL (ref 13.0–17.0)
MCHC: 32.9 g/dL (ref 30.0–36.0)
MCV: 89 fl (ref 78.0–100.0)
PLATELETS: 280 10*3/uL (ref 150.0–400.0)
RBC: 4.72 Mil/uL (ref 4.22–5.81)
RDW: 13.8 % (ref 11.5–15.5)
WBC: 6.9 10*3/uL (ref 4.0–10.5)

## 2015-06-24 LAB — COMPREHENSIVE METABOLIC PANEL
ALT: 14 U/L (ref 0–53)
AST: 20 U/L (ref 0–37)
Albumin: 4.6 g/dL (ref 3.5–5.2)
Alkaline Phosphatase: 62 U/L (ref 39–117)
BILIRUBIN TOTAL: 0.5 mg/dL (ref 0.2–1.2)
BUN: 14 mg/dL (ref 6–23)
CALCIUM: 9.6 mg/dL (ref 8.4–10.5)
CHLORIDE: 103 meq/L (ref 96–112)
CO2: 31 meq/L (ref 19–32)
Creatinine, Ser: 1.15 mg/dL (ref 0.40–1.50)
GFR: 75.29 mL/min (ref >60.00)
Glucose, Bld: 86 mg/dL (ref 70–99)
POTASSIUM: 4.5 meq/L (ref 3.5–5.1)
Sodium: 139 mEq/L (ref 135–145)
Total Protein: 7.3 g/dL (ref 6.0–8.3)

## 2015-06-24 LAB — LIPID PANEL
CHOL/HDL RATIO: 3
Cholesterol: 187 mg/dL (ref 0–200)
HDL: 72.5 mg/dL (ref >39.00)
LDL Cholesterol: 101 mg/dL — ABNORMAL HIGH (ref 0–99)
NonHDL: 114.12
TRIGLYCERIDES: 65 mg/dL (ref 0.0–149.0)
VLDL: 13 mg/dL (ref 0.0–40.0)

## 2015-06-24 LAB — HIV ANTIBODY (ROUTINE TESTING W REFLEX): HIV 1&2 Ab, 4th Generation: NONREACTIVE

## 2015-07-15 ENCOUNTER — Other Ambulatory Visit: Payer: Self-pay | Admitting: *Deleted

## 2015-07-15 NOTE — Patient Outreach (Signed)
F/u on benefit exception request.   Spoke with Crystal,[employer] member (on [facility] consent form), HIPPA verified on spouse ([employer] member).   Crystal reports member  is no longer involved in program Personnel officer) with Dr. Carver Fila, could not afford to pay $125 for each drug test in order for pt to take medication for getting off pain medication along with $25 copay.   Crystal reports program involved member going weekly for the first two weeks, then monthly which again could not afford.  Crystal reports pt is currently going to a Methadone clinic which only takes Medicaid but went because for  the first 30 days was $30, now is $14 a day (out of pocket)- finding difficult to  afford.  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  Spouse reports Dr. Zenon Mayo program is better for [facility] member (hours), thus requesting benefit exception for coverage of drug tests.   RN CM did inform spouse no drug test screening is covered by insurance but wanted to go ahead with benefit exception request.     Plan to present request.     Shayne Alken.   Chevon Fomby RN CCM [facility] Care Management  [phone]

## 2015-08-06 DIAGNOSIS — F1120 Opioid dependence, uncomplicated: Secondary | ICD-10-CM | POA: Diagnosis not present

## 2015-08-14 DIAGNOSIS — F1120 Opioid dependence, uncomplicated: Secondary | ICD-10-CM | POA: Diagnosis not present

## 2015-09-05 ENCOUNTER — Other Ambulatory Visit: Payer: Self-pay | Admitting: *Deleted

## 2015-09-25 ENCOUNTER — Other Ambulatory Visit: Payer: Self-pay | Admitting: General Practice

## 2015-09-25 NOTE — Patient Outreach (Signed)
Kenneth [name] is following this patient for a benefit exception request related to urine drug screening. [name] preferred to receive communication and is a contact person on the consent form. This Clinical research associate researched [facility]) outpatient program and received information from Kenneth [name] that individual counseling could be done after 4 pm per member/patient request. She also stated that Suboxone medication is not prescribed at [facility]; it is carried at the [facility] employee outpatient pharmacy for $25. This information was given to pt's wife and she requested that the exception request move forward as they are unable to afford the urine drug screens obtained from Kenneth [name] and [name].  The benefit exception request was asked of [name], Director of [employer] and also discussed with [name], HR Benefits Specialist. Per Delana, this is not a UMR rule or a product of our [facility] plan design, these are guidelines set forth by CMS/Centers for Medicare and Medicaid and their rulings on what is considered medically necessary. The benefit exception request was denied, insurance plans state this screening serves no medical purpose of treating or diagnosing an illness or disease.  This denial information was discussed with the pt's wife and it was advised that she request a discount or the Medicare cost for the drug screening or set up a payment plan with the lab. She has opted not to follow up with [facility] and states they plan to stay in treatment with Kenneth [name] and seek counseling from his office. She reports the patient receives his Suboxone free there. She was given this writer's contact information if future needs arise.

## 2015-09-26 ENCOUNTER — Encounter: Payer: Self-pay | Admitting: *Deleted

## 2015-09-26 ENCOUNTER — Other Ambulatory Visit: Payer: Self-pay | Admitting: *Deleted

## 2015-12-22 ENCOUNTER — Ambulatory Visit (INDEPENDENT_AMBULATORY_CARE_PROVIDER_SITE_OTHER): Payer: 59 | Admitting: Internal Medicine

## 2015-12-22 ENCOUNTER — Encounter: Payer: Self-pay | Admitting: Internal Medicine

## 2015-12-22 VITALS — BP 126/88 | HR 80 | Temp 98.9°F | Wt 180.0 lb

## 2015-12-22 DIAGNOSIS — Z720 Tobacco use: Secondary | ICD-10-CM

## 2015-12-22 DIAGNOSIS — F17200 Nicotine dependence, unspecified, uncomplicated: Secondary | ICD-10-CM

## 2015-12-22 NOTE — Patient Instructions (Signed)
Smoking Cessation, Tips for Success If you are ready to quit smoking, congratulations! You have chosen to help yourself be healthier. Cigarettes bring nicotine, tar, carbon monoxide, and other irritants into your body. Your lungs, heart, and blood vessels will be able to work better without these poisons. There are many different ways to quit smoking. Nicotine gum, nicotine patches, a nicotine inhaler, or nicotine nasal spray can help with physical craving. Hypnosis, support groups, and medicines help break the habit of smoking. WHAT THINGS CAN I DO TO MAKE QUITTING EASIER?  Here are some tips to help you quit for good:  Pick a date when you will quit smoking completely. Tell all of your friends and family about your plan to quit on that date.  Do not try to slowly cut down on the number of cigarettes you are smoking. Pick a quit date and quit smoking completely starting on that day.  Throw away all cigarettes.   Clean and remove all ashtrays from your home, work, and car.  On a card, write down your reasons for quitting. Carry the card with you and read it when you get the urge to smoke.  Cleanse your body of nicotine. Drink enough water and fluids to keep your urine clear or pale yellow. Do this after quitting to flush the nicotine from your body.  Learn to predict your moods. Do not let a bad situation be your excuse to have a cigarette. Some situations in your life might tempt you into wanting a cigarette.  Never have "just one" cigarette. It leads to wanting another and another. Remind yourself of your decision to quit.  Change habits associated with smoking. If you smoked while driving or when feeling stressed, try other activities to replace smoking. Stand up when drinking your coffee. Brush your teeth after eating. Sit in a different chair when you read the paper. Avoid alcohol while trying to quit, and try to drink fewer caffeinated beverages. Alcohol and caffeine may urge you to  smoke.  Avoid foods and drinks that can trigger a desire to smoke, such as sugary or spicy foods and alcohol.  Ask people who smoke not to smoke around you.  Have something planned to do right after eating or having a cup of coffee. For example, plan to take a walk or exercise.  Try a relaxation exercise to calm you down and decrease your stress. Remember, you may be tense and nervous for the first 2 weeks after you quit, but this will pass.  Find new activities to keep your hands busy. Play with a pen, coin, or rubber band. Doodle or draw things on paper.  Brush your teeth right after eating. This will help cut down on the craving for the taste of tobacco after meals. You can also try mouthwash.   Use oral substitutes in place of cigarettes. Try using lemon drops, carrots, cinnamon sticks, or chewing gum. Keep them handy so they are available when you have the urge to smoke.  When you have the urge to smoke, try deep breathing.  Designate your home as a nonsmoking area.  If you are a heavy smoker, ask your health care provider about a prescription for nicotine chewing gum. It can ease your withdrawal from nicotine.  Reward yourself. Set aside the cigarette money you save and buy yourself something nice.  Look for support from others. Join a support group or smoking cessation program. Ask someone at home or at work to help you with your plan   to quit smoking.  Always ask yourself, "Do I need this cigarette or is this just a reflex?" Tell yourself, "Today, I choose not to smoke," or "I do not want to smoke." You are reminding yourself of your decision to quit.  Do not replace cigarette smoking with electronic cigarettes (commonly called e-cigarettes). The safety of e-cigarettes is unknown, and some may contain harmful chemicals.  If you relapse, do not give up! Plan ahead and think about what you will do the next time you get the urge to smoke. HOW WILL I FEEL WHEN I QUIT SMOKING? You  may have symptoms of withdrawal because your body is used to nicotine (the addictive substance in cigarettes). You may crave cigarettes, be irritable, feel very hungry, cough often, get headaches, or have difficulty concentrating. The withdrawal symptoms are only temporary. They are strongest when you first quit but will go away within 10-14 days. When withdrawal symptoms occur, stay in control. Think about your reasons for quitting. Remind yourself that these are signs that your body is healing and getting used to being without cigarettes. Remember that withdrawal symptoms are easier to treat than the major diseases that smoking can cause.  Even after the withdrawal is over, expect periodic urges to smoke. However, these cravings are generally short lived and will go away whether you smoke or not. Do not smoke! WHAT RESOURCES ARE AVAILABLE TO HELP ME QUIT SMOKING? Your health care provider can direct you to community resources or hospitals for support, which may include:  Group support.  Education.  Hypnosis.  Therapy.   This information is not intended to replace advice given to you by your health care provider. Make sure you discuss any questions you have with your health care provider.   Document Released: 02/27/2004 Document Revised: 06/21/2014 Document Reviewed: 11/16/2012 Elsevier Interactive Patient Education 2016 Elsevier Inc.  

## 2015-12-22 NOTE — Progress Notes (Signed)
Subjective:    Patient ID: Kenneth [name], male    DOB: Mar 07, 1976, 40 y.o.   MRN: [id number]  HPI  Pt presents to the clinic today to discuss smoking cessation. He has been smoking for the last 6 months, dipping snuff prior to that and smoking for 20 years prior to that. He has not tried the patches or gums. He is interested in trying Chantix for smoking cessation.  Review of Systems      Past Medical History  Diagnosis Date  . PTSD (post-traumatic stress disorder)   . Narcotic abuse   . Frequent headaches     Current Outpatient Prescriptions  Medication Sig Dispense Refill  . methadone (DOLOPHINE) 1 mg/ml oral solution Take 50 mg/kg by mouth daily.      No current facility-administered medications for this visit.    No Known Allergies  Family History  Problem Relation Age of Onset  . Miscarriages / [place] Brother   . Schizophrenia Brother     Social History   Social History  . Marital Status: Married    Spouse Name: N/A  . Number of Children: N/A  . Years of Education: N/A   Occupational History  . Not on file.   Social History Main Topics  . Smoking status: Current Some Day Smoker -- 1.00 packs/day for .5 years    Types: Cigarettes  . Smokeless tobacco: Current User    Types: Snuff  . Alcohol Use: No  . Drug Use: No  . Sexual Activity: Yes    Birth Control/ Protection: None   Other Topics Concern  . Not on file   Social History Narrative     Constitutional: Denies fever, malaise, fatigue, headache or abrupt weight changes.  Respiratory: Pt reports occasional shortness of breath. Denies difficulty breathing, cough or sputum production.   Cardiovascular: Denies chest pain, chest tightness, palpitations or swelling in the hands or feet.  Neurological: Denies dizziness, difficulty with memory, difficulty with speech or problems with balance and coordination.    No other specific complaints in a complete review of systems (except as listed in  HPI above).  Objective:   Physical Exam   BP 126/88 mmHg  Pulse 80  Temp(Src) 98.9 F (37.2 C) (Oral)  Wt 180 lb (81.647 kg)  SpO2 97% Wt Readings from Last 3 Encounters:  12/22/15 180 lb (81.647 kg)  06/23/15 180 lb (81.647 kg)  05/20/15 176 lb (79.833 kg)    General: Appears his stated age, well developed, well nourished in NAD. Cardiovascular: Normal rate and rhythm. S1,S2 noted.  No murmur, rubs or gallops noted.  Pulmonary/Chest: Normal effort and positive vesicular breath sounds. No respiratory distress. No wheezes, rales or ronchi noted.  Neurological: Alert and oriented.   BMET    Component Value Date/Time   NA 139 06/23/2015 1546   NA 139 06/22/2012 1524   K 4.5 06/23/2015 1546   K 3.9 06/22/2012 1524   CL 103 06/23/2015 1546   CL 105 06/22/2012 1524   CO2 31 06/23/2015 1546   CO2 28 06/22/2012 1524   GLUCOSE 86 06/23/2015 1546   GLUCOSE 99 06/22/2012 1524   BUN 14 06/23/2015 1546   BUN 12 06/22/2012 1524   CREATININE 1.15 06/23/2015 1546   CREATININE 1.13 06/22/2012 1524   CALCIUM 9.6 06/23/2015 1546   CALCIUM 8.6 06/22/2012 1524   GFRNONAA >60 06/22/2012 1524   GFRNONAA >90 12/06/2011 0221   GFRAA >60 06/22/2012 1524   GFRAA >90 12/06/2011 0221  Lipid Panel     Component Value Date/Time   CHOL 187 06/23/2015 1546   TRIG 65.0 06/23/2015 1546   HDL 72.50 06/23/2015 1546   CHOLHDL 3 06/23/2015 1546   VLDL 13.0 06/23/2015 1546   LDLCALC 101* 06/23/2015 1546    CBC    Component Value Date/Time   WBC 6.9 06/23/2015 1546   WBC 4.4 06/22/2012 1524   RBC 4.72 06/23/2015 1546   RBC 4.99 06/22/2012 1524   HGB 13.8 06/23/2015 1546   HGB 14.7 06/22/2012 1524   HCT 42.0 06/23/2015 1546   HCT 44.2 06/22/2012 1524   PLT 280.0 06/23/2015 1546   PLT 261 06/22/2012 1524   MCV 89.0 06/23/2015 1546   MCV 89 06/22/2012 1524   MCH 29.4 06/22/2012 1524   MCH 29.9 12/06/2011 0221   MCHC 32.9 06/23/2015 1546   MCHC 33.3 06/22/2012 1524   RDW 13.8  06/23/2015 1546   RDW 13.3 06/22/2012 1524   LYMPHSABS 1.2 06/22/2012 1524   MONOABS 0.5 06/22/2012 1524   EOSABS 0.1 06/22/2012 1524   BASOSABS 0.0 06/22/2012 1524    Hgb A1C No results found for: HGBA1C      Assessment & Plan:   Smoker, motivated to quit:  Handout given on smoking cessation tips for success Advised him to try Nicotene patches or gum before he starts Chantix Consider psycotherapy given history of addiction  RTC as needed Nicki Reaper, NP

## 2015-12-22 NOTE — Progress Notes (Signed)
Pre visit review using our clinic review tool, if applicable. No additional management support is needed unless otherwise documented below in the visit note. 

## 2016-06-16 ENCOUNTER — Ambulatory Visit (INDEPENDENT_AMBULATORY_CARE_PROVIDER_SITE_OTHER): Payer: Self-pay | Admitting: Family Medicine

## 2016-06-16 ENCOUNTER — Telehealth: Payer: Self-pay | Admitting: Internal Medicine

## 2016-06-16 ENCOUNTER — Ambulatory Visit: Payer: Self-pay | Admitting: Family Medicine

## 2016-06-16 DIAGNOSIS — Z0289 Encounter for other administrative examinations: Secondary | ICD-10-CM

## 2016-06-16 NOTE — Telephone Encounter (Signed)
F/u prn

## 2016-06-16 NOTE — Telephone Encounter (Signed)
Patient did not come in for their appointment today for pain in private area. Please let me know if patient needs to be contacted immediately for follow up or no follow up needed.

## 2016-07-06 ENCOUNTER — Ambulatory Visit: Payer: Self-pay | Admitting: Family Medicine

## 2016-09-13 DIAGNOSIS — H52223 Regular astigmatism, bilateral: Secondary | ICD-10-CM | POA: Diagnosis not present

## 2017-09-28 DIAGNOSIS — H52223 Regular astigmatism, bilateral: Secondary | ICD-10-CM | POA: Diagnosis not present

## 2017-12-26 ENCOUNTER — Ambulatory Visit (HOSPITAL_COMMUNITY)
Admission: EM | Admit: 2017-12-26 | Discharge: 2017-12-26 | Disposition: A | Discharge status: Home / Self Care | Payer: Medicaid Other | Attending: Family Medicine | Admitting: Family Medicine

## 2017-12-26 ENCOUNTER — Other Ambulatory Visit: Payer: Self-pay

## 2017-12-26 ENCOUNTER — Encounter (HOSPITAL_COMMUNITY): Payer: Self-pay | Admitting: Emergency Medicine

## 2017-12-26 DIAGNOSIS — R21 Rash and other nonspecific skin eruption: Secondary | ICD-10-CM

## 2017-12-26 MED ORDER — PERMETHRIN 5 % EX CREA: TOPICAL_CREAM | CUTANEOUS | 0 refills | Status: DC

## 2017-12-26 MED ORDER — PREDNISONE 10 MG (21) PO TBPK: ORAL_TABLET | Freq: Every day | ORAL | 0 refills | Status: DC

## 2017-12-26 MED ORDER — CETIRIZINE HCL 10 MG PO TABS: 10.0000 mg | ORAL_TABLET | Freq: Every day | ORAL | 0 refills | Status: DC

## 2017-12-26 NOTE — ED Triage Notes (Signed)
Pt has several bites on his ankles, wrists, calves, and forearms that have been spreading over the last few weeks.  Pt has two pictures of the insect that he states has come out of two different bites.

## 2017-12-26 NOTE — ED Provider Notes (Signed)
[facility]    CSN: [id number] Arrival date & time: 12/26/17  1324     History   Chief Complaint Chief Complaint  Patient presents with  . Insect Bite    HPI Kenneth [name] is a 42 y.o. male.   42 year old male comes in for 1 week history of rash/insect bites to the body.  States first started on the ankles, and now spreading to lower extremities forearm, wrist.  Denies erythema, increased warmth, fever.  Denies pain.  States rash is itching in nature, worse at night.  States work does expose him to a lot of insects as well.  No obvious body hygiene changes.  States was in jail  last month, and currently has ankle brace on, and is where rash first started.  Denies family members itching as well.  Has not taken anything for the symptoms.     Past Medical History:  Diagnosis Date  . Frequent headaches   . Narcotic abuse (HCC)   . PTSD (post-traumatic stress disorder)     Patient Active Problem List   Diagnosis Date Noted  . PTSD (post-traumatic stress disorder) 05/20/2015  . 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 Frequent headaches 05/20/2015    Past Surgical History:  Procedure Laterality Date  . TONSILLECTOMY         Home Medications    Prior to Admission medications   Medication Sig Start Date End Date Taking? Authorizing Provider  cetirizine (ZYRTEC) 10 MG tablet Take 1 tablet (10 mg total) by mouth daily. 12/26/17   Cathie Hoops, Amy V, PA-C  methadone (DOLOPHINE) 1 mg/ml oral solution Take 50 mg/kg by mouth daily.     [provider]  permethrin (ELIMITE) 5 % cream Thoroughly massage cream (30 g for average adult) from head to soles of feet; leave on for 8 to 14 hours before removing (shower or bath) 12/26/17   Cathie Hoops, Amy V, PA-C  predniSONE (STERAPRED UNI-PAK 21 TAB) 10 MG (21) TBPK tablet Take by mouth daily. Take 6 tabs by mouth day 1, then 5 tabs, then 4 tabs, then 3 tabs, 2 tabs, then 1 tab for the last day 12/26/17   Belinda Fisher, PA-C     Family History Family History  Problem Relation Age of Onset  . Miscarriages / [place] Brother   . Schizophrenia Brother     Social History Social History   Tobacco Use  . Smoking status: Current Some Day Smoker    Packs/day: 1.00    Years: 0.50    Pack years: 0.50    Types: Cigarettes  . Smokeless tobacco: Current User    Types: Snuff  Substance Use Topics  . Alcohol use: No    Alcohol/week: 0.0 oz  . Drug use: No    Types: Cocaine, Marijuana     Allergies   Patient has no known allergies.   Review of Systems Review of Systems  Reason unable to perform ROS: See HPI as above.     Physical Exam Triage Vital Signs ED Triage Vitals  Enc Vitals Group     BP 12/26/17 0859 114/70     Pulse Rate 12/26/17 0859 85     Resp 12/26/17 0859 16     Temp 12/26/17 0859 98 F (36.7 C)     Temp Source 12/26/17 0859 Oral     SpO2 12/26/17 0859 98 %     Weight --      Height --  Head Circumference --      Peak Flow --      Pain Score 12/26/17 0913 0     Pain Loc --      Pain Edu? --      Excl. in GC? --    No data found.  Updated Vital Signs BP 114/70 (BP Location: Left Arm)   Pulse 85   Temp 98 F (36.7 C) (Oral)   Resp 16   SpO2 98%   Physical Exam  Constitutional: He is oriented to person, place, and time. He appears well-developed and well-nourished. No distress.  HENT:  Head: Normocephalic and atraumatic.  Eyes: Pupils are equal, round, and reactive to light. Conjunctivae are normal.  Neurological: He is alert and oriented to person, place, and time.  Skin: Skin is warm and dry. He is not diaphoretic.  See picture below. Similar rashes can be seen of bilateral wrist, bilateral upper thigh/groin area. No tenderness to palpation. No increased warmth.         UC Treatments / Results  Labs (all labs ordered are listed, but only abnormal results are displayed) Labs Reviewed - No data to display  EKG None  Radiology No results  found.  Procedures Procedures (including critical care time)  Medications Ordered in UC Medications - No data to display  Initial Impression / Assessment and Plan / UC Course  I have reviewed the triage vital signs and the nursing notes.  Pertinent labs & imaging results that were available during my care of the patient were reviewed by me and considered in my medical decision making (see chart for details).    Will treat empirically for scabies given rashes seen to the webspace. Permethrin as directed. Prednisone as directed. Patient to take antihistamine for itching. Return precautions given.  Final Clinical Impressions(s) / UC Diagnoses   Final diagnoses:  Rash    ED Prescriptions    Medication Sig Dispense Auth. Provider   permethrin (ELIMITE) 5 % cream Thoroughly massage cream (30 g for average adult) from head to soles of feet; leave on for 8 to 14 hours before removing (shower or bath) 60 g Yu, Amy V, PA-C   predniSONE (STERAPRED UNI-PAK 21 TAB) 10 MG (21) TBPK tablet Take by mouth daily. Take 6 tabs by mouth day 1, then 5 tabs, then 4 tabs, then 3 tabs, 2 tabs, then 1 tab for the last day 21 tablet Yu, Amy V, PA-C   cetirizine (ZYRTEC) 10 MG tablet Take 1 tablet (10 mg total) by mouth daily. 15 tablet Threasa Alpha, [place] 12/26/17 [phone]

## 2017-12-26 NOTE — Discharge Instructions (Signed)
As discussed, will cover empirically for scabies.  Start permethrin as directed.  Prednisone as directed.  You can take Zyrtec to help with itching.  Please refrain from itching, you can try ice compress for itching as well.  Monitor for spreading redness, increased warmth, fever, follow-up for reevaluation.

## 2018-01-12 ENCOUNTER — Encounter (HOSPITAL_COMMUNITY): Payer: Self-pay | Admitting: *Deleted

## 2018-01-12 ENCOUNTER — Ambulatory Visit (HOSPITAL_COMMUNITY)
Admission: EM | Admit: 2018-01-12 | Discharge: 2018-01-12 | Disposition: A | Discharge status: Home / Self Care | Payer: Medicaid Other | Attending: Family Medicine | Admitting: Family Medicine

## 2018-01-12 ENCOUNTER — Other Ambulatory Visit: Payer: Self-pay

## 2018-01-12 DIAGNOSIS — Z23 Encounter for immunization: Secondary | ICD-10-CM

## 2018-01-12 DIAGNOSIS — S4992XA Unspecified injury of left shoulder and upper arm, initial encounter: Secondary | ICD-10-CM | POA: Diagnosis not present

## 2018-01-12 DIAGNOSIS — S4990XA Unspecified injury of shoulder and upper arm, unspecified arm, initial encounter: Secondary | ICD-10-CM

## 2018-01-12 DIAGNOSIS — S50852A Superficial foreign body of left forearm, initial encounter: Secondary | ICD-10-CM

## 2018-01-12 MED ORDER — TETANUS-DIPHTH-ACELL PERTUSSIS 5-2.5-18.5 LF-MCG/0.5 IM SUSP
INTRAMUSCULAR | Status: AC
  Filled 2018-01-12: qty 0.5

## 2018-01-12 MED ORDER — TETANUS-DIPHTH-ACELL PERTUSSIS 5-2.5-18.5 LF-MCG/0.5 IM SUSP
0.5000 mL | Freq: Once | INTRAMUSCULAR | Status: AC
  Administered 2018-01-12: 0.5 mL via INTRAMUSCULAR

## 2018-01-12 MED ORDER — LIDOCAINE-EPINEPHRINE (PF) 2 %-1:200000 IJ SOLN
INTRAMUSCULAR | Status: AC
  Filled 2018-01-12: qty 20

## 2018-01-12 NOTE — ED Provider Notes (Signed)
[facility]    CSN: [id number] Arrival date & time: 01/12/18  1640     History   Chief Complaint Chief Complaint  Patient presents with  . Foreign Body in Skin    HPI Kenneth [name] is a 42 y.o. male history of PTSD presenting today for evaluation of fishhook in his left bicep.  Patient states he was walking past his truck where his fishing poles were and the hook caught his upper arm.  This happened today.  States his tetanus was last updated around 10 years ago.  Has had pain with flexion of arm/movements requiring bicep.  HPI  Past Medical History:  Diagnosis Date  . Frequent headaches   . Narcotic abuse (HCC)   . PTSD (post-traumatic stress disorder)     Patient Active Problem List   Diagnosis Date Noted  . PTSD (post-traumatic stress disorder) 05/20/2015  . 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 Frequent headaches 05/20/2015    Past Surgical History:  Procedure Laterality Date  . TONSILLECTOMY         Home Medications    Prior to Admission medications   Medication Sig Start Date End Date Taking? Authorizing Provider  cetirizine (ZYRTEC) 10 MG tablet Take 1 tablet (10 mg total) by mouth daily. 12/26/17   Cathie Hoops, Amy V, PA-C  methadone (DOLOPHINE) 1 mg/ml oral solution Take 50 mg/kg by mouth daily.     [provider]  permethrin (ELIMITE) 5 % cream Thoroughly massage cream (30 g for average adult) from head to soles of feet; leave on for 8 to 14 hours before removing (shower or bath) 12/26/17   Cathie Hoops, Amy V, PA-C  predniSONE (STERAPRED UNI-PAK 21 TAB) 10 MG (21) TBPK tablet Take by mouth daily. Take 6 tabs by mouth day 1, then 5 tabs, then 4 tabs, then 3 tabs, 2 tabs, then 1 tab for the last day 12/26/17   Belinda Fisher, PA-C    Family History Family History  Problem Relation Age of Onset  . Miscarriages / [place] Brother   . Schizophrenia Brother     Social History Social History   Tobacco Use  . Smoking status: Current Some Day  Smoker    Packs/day: 1.00    Years: 0.50    Pack years: 0.50    Types: Cigarettes  . Smokeless tobacco: Current User    Types: Snuff  Substance Use Topics  . Alcohol use: No    Alcohol/week: 0.0 oz  . Drug use: No    Types: Cocaine, Marijuana     Allergies   Patient has no known allergies.   Review of Systems Review of Systems  Constitutional: Negative for fatigue and fever.  Eyes: Negative for redness, itching and visual disturbance.  Respiratory: Negative for shortness of breath.   Cardiovascular: Negative for chest pain and leg swelling.  Gastrointestinal: Negative for nausea and vomiting.  Musculoskeletal: Positive for myalgias. Negative for arthralgias.  Skin: Positive for color change and wound. Negative for rash.  Neurological: Negative for dizziness, syncope, weakness, light-headedness and headaches.     Physical Exam Triage Vital Signs ED Triage Vitals [01/12/18 1659]  Enc Vitals Group     BP (!) 161/106     Pulse Rate 83     Resp 18     Temp 98.7 F (37.1 C)     Temp Source Oral     SpO2 98 %     Weight      Height  Head Circumference      Peak Flow      Pain Score 5     Pain Loc      Pain Edu?      Excl. in GC?    No data found.  Updated Vital Signs BP (!) 161/106 (BP Location: Right Arm)   Pulse 83   Temp 98.7 F (37.1 C) (Oral)   Resp 18   SpO2 98%   Visual Acuity Right Eye Distance:   Left Eye Distance:   Bilateral Distance:    Right Eye Near:   Left Eye Near:    Bilateral Near:     Physical Exam  Constitutional: He is oriented to person, place, and time. He appears well-developed and well-nourished.  No acute distress  HENT:  Head: Normocephalic and atraumatic.  Nose: Nose normal.  Eyes: Conjunctivae are normal.  Neck: Neck supple.  Cardiovascular: Normal rate.  Pulmonary/Chest: Effort normal. No respiratory distress.  Abdominal: He exhibits no distension.  Musculoskeletal: Normal range of motion.  Full active range  of motion at left elbow and shoulder.  Neurological: He is alert and oriented to person, place, and time.  Skin: Skin is warm and dry.  Large fishhook in left upper bicep  Psychiatric: He has a normal mood and affect.  Nursing note and vitals reviewed.    UC Treatments / Results  Labs (all labs ordered are listed, but only abnormal results are displayed) Labs Reviewed - No data to display  EKG None  Radiology No results found.  Procedures Procedures (including critical care time)  Medications Ordered in UC Medications  Tdap (BOOSTRIX) injection 0.5 mL (0.5 mLs Intramuscular Given 01/12/18 1759)    Initial Impression / Assessment and Plan / UC Course  I have reviewed the triage vital signs and the nursing notes.  Pertinent labs & imaging results that were available during my care of the patient were reviewed by me and considered in my medical decision making (see chart for details).     Fishhook removed, tetanus updated.  Likely going to have soreness and weakness in his bicep given location.  Not concerning for underlying fracture given hook was superficial.Discussed strict return precautions. Patient verbalized understanding and is agreeable with plan.  Final Clinical Impressions(s) / UC Diagnoses   Final diagnoses:  Fish hook injury of upper arm     Discharge Instructions     Use anti-inflammatories for pain/swelling. You may take up to 800 mg Ibuprofen every 8 hours with food. You may supplement Ibuprofen with Tylenol [phone] mg every 8 hours.   Updates tetanus  Follow up if developing redness, swelling, drainage from this area    ED Prescriptions    None     Controlled Substance Prescriptions [place] Controlled Substance Registry consulted? Not Applicable   Lew Dawes, [place] 01/12/18 1805

## 2018-01-12 NOTE — ED Triage Notes (Signed)
Fish hook in left upper arm

## 2018-01-12 NOTE — Discharge Instructions (Signed)
Use anti-inflammatories for pain/swelling. You may take up to 800 mg Ibuprofen every 8 hours with food. You may supplement Ibuprofen with Tylenol [phone] mg every 8 hours.   Updates tetanus  Follow up if developing redness, swelling, drainage from this area

## 2018-02-16 ENCOUNTER — Encounter (HOSPITAL_COMMUNITY): Payer: Self-pay | Admitting: *Deleted

## 2018-02-16 ENCOUNTER — Ambulatory Visit (HOSPITAL_COMMUNITY)
Admission: EM | Admit: 2018-02-16 | Discharge: 2018-02-16 | Disposition: A | Discharge status: Home / Self Care | Payer: Medicaid Other | Attending: Family Medicine | Admitting: Family Medicine

## 2018-02-16 DIAGNOSIS — W57XXXA Bitten or stung by nonvenomous insect and other nonvenomous arthropods, initial encounter: Secondary | ICD-10-CM | POA: Diagnosis not present

## 2018-02-16 DIAGNOSIS — S50861A Insect bite (nonvenomous) of right forearm, initial encounter: Secondary | ICD-10-CM

## 2018-02-16 MED ORDER — DOXYCYCLINE HYCLATE 100 MG PO CAPS: 100.0000 mg | ORAL_CAPSULE | Freq: Two times a day (BID) | ORAL | 0 refills | Status: DC

## 2018-02-16 NOTE — ED Triage Notes (Addendum)
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 Pt now has swollen area with white head over area. Pt has seen brown recluse spiders. Pt has been taking leftover amoxicillin

## 2018-02-16 NOTE — Discharge Instructions (Addendum)
We will give you some antibiotics to cover for infection. Please monitor the area for worsening redness, pain, swelling. Warm compresses 3-4 times a day will help promote drainage.  You can also do a hot shower. Ibuprofen for pain and inflammation Follow up as needed for continued or worsening symptoms

## 2018-02-16 NOTE — ED Provider Notes (Signed)
[facility]    CSN: [id number] Arrival date & time: 02/16/18  0935     History   Chief Complaint Chief Complaint  Patient presents with  . Insect Bite    HPI Kenneth [name] is a 42 y.o. male.   Is a 42 year old male that presents with insect bite to right forearm.  He noticed this about 3 days ago.  Since the area has become more red, swollen, tender white center.  He denies any drainage from the area.  He reports that he was recently working in his shop cleaning up and he did see a brown recluse.  He reports that the spider was not on him and he did not see it bite him.  The bite is localized.  There is no streaking.  He has not experienced any fever, joint aches, headaches, nausea, fatigue.   Past medical history of frequent headaches, previousnarcotic abuse, PTSD Current everyday smoker Family history of schizophrenia   ROS per HPI      Past Medical History:  Diagnosis Date  . Frequent headaches   . Narcotic abuse (HCC)   . PTSD (post-traumatic stress disorder)     Patient Active Problem List   Diagnosis Date Noted  . PTSD (post-traumatic stress disorder) 05/20/2015  . 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 Frequent headaches 05/20/2015    Past Surgical History:  Procedure Laterality Date  . TONSILLECTOMY         Home Medications    Prior to Admission medications   Medication Sig Start Date End Date Taking? Authorizing Provider  cetirizine (ZYRTEC) 10 MG tablet Take 1 tablet (10 mg total) by mouth daily. 12/26/17   Cathie Hoops, Amy V, PA-C  doxycycline (VIBRAMYCIN) 100 MG capsule Take 1 capsule (100 mg total) by mouth 2 (two) times daily. 02/16/18   Dahlia Byes A, NP  methadone (DOLOPHINE) 1 mg/ml oral solution Take 50 mg/kg by mouth daily.     [provider]  permethrin (ELIMITE) 5 % cream Thoroughly massage cream (30 g for average adult) from head to soles of feet; leave on for 8 to 14 hours before removing (shower or bath) 12/26/17    Cathie Hoops, Amy V, PA-C  predniSONE (STERAPRED UNI-PAK 21 TAB) 10 MG (21) TBPK tablet Take by mouth daily. Take 6 tabs by mouth day 1, then 5 tabs, then 4 tabs, then 3 tabs, 2 tabs, then 1 tab for the last day 12/26/17   Belinda Fisher, PA-C    Family History Family History  Problem Relation Age of Onset  . Miscarriages / [place] Brother   . Schizophrenia Brother     Social History Social History   Tobacco Use  . Smoking status: Current Some Day Smoker    Packs/day: 1.00    Years: 0.50    Pack years: 0.50    Types: Cigarettes  . Smokeless tobacco: Current User    Types: Snuff  Substance Use Topics  . Alcohol use: No    Alcohol/week: 0.0 standard drinks  . Drug use: No    Types: Cocaine, Marijuana     Allergies   Patient has no known allergies.   Review of Systems Review of Systems   Physical Exam Triage Vital Signs ED Triage Vitals  Enc Vitals Group     BP 02/16/18 0949 124/87     Pulse Rate 02/16/18 0949 97     Resp 02/16/18 0949 18     Temp 02/16/18 0949 97.9 F (36.6 C)  Temp Source 02/16/18 0949 Oral     SpO2 02/16/18 0949 100 %     Weight 02/16/18 0952 180 lb (81.6 kg)     Height --      Head Circumference --      Peak Flow --      Pain Score 02/16/18 0952 3     Pain Loc --      Pain Edu? --      Excl. in GC? --    No data found.  Updated Vital Signs BP 124/87 (BP Location: Right Arm)   Pulse 97   Temp 97.9 F (36.6 C) (Oral)   Resp 18   Wt 180 lb (81.6 kg)   SpO2 100%   BMI 24.08 kg/m   Visual Acuity Right Eye Distance:   Left Eye Distance:   Bilateral Distance:    Right Eye Near:   Left Eye Near:    Bilateral Near:     Physical Exam  Constitutional: He is oriented to person, place, and time. He appears well-developed and well-nourished.  Very pleasant. Non toxic or ill appearing.     HENT:  Head: Normocephalic and atraumatic.  Eyes: Conjunctivae are normal.  Neck: Normal range of motion.  Pulmonary/Chest: Effort normal.    Musculoskeletal: Normal range of motion.  Neurological: He is alert and oriented to person, place, and time.  Skin: Skin is warm and dry.  3 cm by 3 cm abscess to the right posterior forearm. Pustule in center not draining. No fluctuance. Erythematous and indurated. No streaking. Warm to touch.   Psychiatric: He has a normal mood and affect.  Nursing note and vitals reviewed.    UC Treatments / Results  Labs (all labs ordered are listed, but only abnormal results are displayed) Labs Reviewed - No data to display  EKG None  Radiology No results found.  Procedures Procedures (including critical care time)  Medications Ordered in UC Medications - No data to display  Initial Impression / Assessment and Plan / UC Course  I have reviewed the triage vital signs and the nursing notes.  Pertinent labs & imaging results that were available during my care of the patient were reviewed by me and considered in my medical decision making (see chart for details).     Possible infected insect bite Will go ahead and treat with antibiotics Warm compresses to the area to promote drainage.  Ibuprofen for pain instructed to return if the infection worsens.  Pt agreed.  Final Clinical Impressions(s) / UC Diagnoses   Final diagnoses:  Insect bite of right forearm, initial encounter     Discharge Instructions     We will give you some antibiotics to cover for infection. Please monitor the area for worsening redness, pain, swelling. Warm compresses 3-4 times a day will help promote drainage.  You can also do a hot shower. Ibuprofen for pain and inflammation Follow up as needed for continued or worsening symptoms     ED Prescriptions    Medication Sig Dispense Auth. Provider   doxycycline (VIBRAMYCIN) 100 MG capsule Take 1 capsule (100 mg total) by mouth 2 (two) times daily. 20 capsule Dahlia Byes A, NP     Controlled Substance Prescriptions [place] Controlled Substance Registry  consulted? Not Applicable   Janace Aris, NP 02/16/18 1101

## 2018-04-05 DIAGNOSIS — H16002 Unspecified corneal ulcer, left eye: Secondary | ICD-10-CM | POA: Diagnosis not present

## 2018-04-15 ENCOUNTER — Ambulatory Visit (HOSPITAL_COMMUNITY)
Admission: EM | Admit: 2018-04-15 | Discharge: 2018-04-15 | Disposition: A | Discharge status: Home / Self Care | Payer: BLUE CROSS/BLUE SHIELD | Attending: Emergency Medicine | Admitting: Emergency Medicine

## 2018-04-15 ENCOUNTER — Encounter (HOSPITAL_COMMUNITY): Payer: Self-pay

## 2018-04-15 DIAGNOSIS — L03116 Cellulitis of left lower limb: Secondary | ICD-10-CM | POA: Diagnosis not present

## 2018-04-15 DIAGNOSIS — L02416 Cutaneous abscess of left lower limb: Secondary | ICD-10-CM

## 2018-04-15 MED ORDER — CEFTRIAXONE SODIUM 1 G IJ SOLR
1.0000 g | Freq: Once | INTRAMUSCULAR | Status: AC
Start: 2018-04-15 — End: 2018-04-15
  Administered 2018-04-15: 1 g via INTRAMUSCULAR

## 2018-04-15 MED ORDER — LIDOCAINE HCL (PF) 1 % IJ SOLN
INTRAMUSCULAR | Status: AC
  Filled 2018-04-15: qty 2

## 2018-04-15 MED ORDER — CEFTRIAXONE SODIUM 1 G IJ SOLR
INTRAMUSCULAR | Status: AC
  Filled 2018-04-15: qty 10

## 2018-04-15 MED ORDER — SULFAMETHOXAZOLE-TRIMETHOPRIM 800-160 MG PO TABS: 1.0000 | ORAL_TABLET | Freq: Two times a day (BID) | ORAL | 0 refills | Status: AC

## 2018-04-15 NOTE — ED Provider Notes (Signed)
[facility]    CSN: [id number] Arrival date & time: 04/15/18  1353     History   Chief Complaint Chief Complaint  Patient presents with  . Appointment    [address]    HPI Kenneth [name] is a 42 y.o. male.   Kenneth [name] presents with complaints of swelling, pain and drainage to abscess to left knee. Developed approximately 4 days ago. Started out as a pimple appearing lesion, he did squeeze it with white discharge. Redness has been increasing. Noted white/yellow pus today. No fevers. Has taken ibuprofen for pain, no other medications. Has had similar in the past, last was 02/16/2018. No known bite/injury/trauma. He does endorse smoking meth daily. Discussed this at length with patient and increased risk for skin lesions and infections.    ROS per HPI.      Past Medical History:  Diagnosis Date  . Frequent headaches   . Narcotic abuse (HCC)   . PTSD (post-traumatic stress disorder)     Patient Active Problem List   Diagnosis Date Noted  . PTSD (post-traumatic stress disorder) 05/20/2015  . 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 Frequent headaches 05/20/2015    Past Surgical History:  Procedure Laterality Date  . TONSILLECTOMY         Home Medications    Prior to Admission medications   Medication Sig Start Date End Date Taking? Authorizing Provider  cetirizine (ZYRTEC) 10 MG tablet Take 1 tablet (10 mg total) by mouth daily. 12/26/17   Cathie Hoops, Amy V, PA-C  doxycycline (VIBRAMYCIN) 100 MG capsule Take 1 capsule (100 mg total) by mouth 2 (two) times daily. 02/16/18   Dahlia Byes A, NP  methadone (DOLOPHINE) 1 mg/ml oral solution Take 50 mg/kg by mouth daily.     [provider]  permethrin (ELIMITE) 5 % cream Thoroughly massage cream (30 g for average adult) from head to soles of feet; leave on for 8 to 14 hours before removing (shower or bath) 12/26/17   Cathie Hoops, Amy V, PA-C  predniSONE (STERAPRED UNI-PAK 21 TAB) 10 MG (21) TBPK tablet Take by  mouth daily. Take 6 tabs by mouth day 1, then 5 tabs, then 4 tabs, then 3 tabs, 2 tabs, then 1 tab for the last day 12/26/17   Cathie Hoops, Amy V, PA-C  sulfamethoxazole-trimethoprim (BACTRIM DS) 800-160 MG tablet Take 1 tablet by mouth 2 (two) times daily for 10 days. 04/15/18 04/25/18  Georgetta Haber, NP    Family History Family History  Problem Relation Age of Onset  . Miscarriages / [place] Brother   . Schizophrenia Brother     Social History Social History   Tobacco Use  . Smoking status: Current Some Day Smoker    Packs/day: 1.00    Years: 0.50    Pack years: 0.50    Types: Cigarettes  . Smokeless tobacco: Current User    Types: Snuff  Substance Use Topics  . Alcohol use: No    Alcohol/week: 0.0 standard drinks  . Drug use: No    Types: Cocaine, Marijuana     Allergies   Patient has no known allergies.   Review of Systems Review of Systems   Physical Exam Triage Vital Signs ED Triage Vitals  Enc Vitals Group     BP 04/15/18 1413 104/70     Pulse Rate 04/15/18 1413 (!) 105     Resp 04/15/18 1413 18     Temp 04/15/18 1413 98.2 F (36.8 C)  Temp src --      SpO2 04/15/18 1413 100 %     Weight --      Height --      Head Circumference --      Peak Flow --      Pain Score 04/15/18 1412 9     Pain Loc --      Pain Edu? --      Excl. in GC? --    No data found.  Updated Vital Signs BP 104/70   Pulse (!) 105   Temp 98.2 F (36.8 C)   Resp 18   SpO2 100%   Visual Acuity Right Eye Distance:   Left Eye Distance:   Bilateral Distance:    Right Eye Near:   Left Eye Near:    Bilateral Near:     Physical Exam  Constitutional: He is oriented to person, place, and time. He appears well-developed and well-nourished.  Cardiovascular: Normal rate and regular rhythm.  Pulmonary/Chest: Effort normal and breath sounds normal.  Musculoskeletal:       Legs: Approximately 7 cm in total diameter of redness with partially scabbed central lesion; no active  drainage; no fluctuance or induration; no streaking; no bony tenderness to knee, full ROM but does experience pain to affected area with knee flexion   Neurological: He is alert and oriented to person, place, and time.  Skin: Skin is warm and dry.     UC Treatments / Results  Labs (all labs ordered are listed, but only abnormal results are displayed) Labs Reviewed - No data to display  EKG None  Radiology No results found.  Procedures Procedures (including critical care time)  Medications Ordered in UC Medications  cefTRIAXone (ROCEPHIN) injection 1 g (1 g Intramuscular Given 04/15/18 1444)    Initial Impression / Assessment and Plan / UC Course  I have reviewed the triage vital signs and the nursing notes.  Pertinent labs & imaging results that were available during my care of the patient were reviewed by me and considered in my medical decision making (see chart for details).     Infection near knee joint, rocephin provided here today with course of bactrim. Discussed drug use and treatment options at length with patient. Return precautions provided. If symptoms worsen or do not improve in the next week to return to be seen or to follow up with PCP.  Patient verbalized understanding and agreeable to plan.   Final Clinical Impressions(s) / UC Diagnoses   Final diagnoses:  Cellulitis of left lower extremity     Discharge Instructions     Warm compresses 3-4 times a day to promote drainage as able.  Ibuprofen as needed for pain.  Complete course of antibiotics.  Avoid touching or squeezing of it to prevent trauma or further infection.  I would recommend treatment programs for your addiction.    ED Prescriptions    Medication Sig Dispense Auth. Provider   sulfamethoxazole-trimethoprim (BACTRIM DS) 800-160 MG tablet Take 1 tablet by mouth 2 (two) times daily for 10 days. 20 tablet Georgetta Haber, NP     Controlled Substance Prescriptions  Controlled Substance  Registry consulted? Not Applicable   Georgetta Haber, NP 04/15/18 1515

## 2018-04-15 NOTE — ED Notes (Signed)
Bed: UC01 Expected date:  Expected time:  Means of arrival:  Comments: held

## 2018-04-15 NOTE — ED Triage Notes (Signed)
Pt presents with complaints of bump to his left knee. States it could be a spider bite. Reports history of same.

## 2018-04-15 NOTE — Discharge Instructions (Signed)
Warm compresses 3-4 times a day to promote drainage as able.  Ibuprofen as needed for pain.  Complete course of antibiotics.  Avoid touching or squeezing of it to prevent trauma or further infection.  I would recommend treatment programs for your addiction.

## 2018-05-28 ENCOUNTER — Inpatient Hospital Stay (HOSPITAL_COMMUNITY)
Admission: EM | Admit: 2018-05-28 | Discharge: 2018-06-04 | DRG: 917 | Disposition: A | Discharge status: Home / Self Care | Payer: BLUE CROSS/BLUE SHIELD | Attending: Internal Medicine | Admitting: Internal Medicine

## 2018-05-28 ENCOUNTER — Other Ambulatory Visit: Payer: Self-pay

## 2018-05-28 ENCOUNTER — Encounter (HOSPITAL_COMMUNITY): Payer: Self-pay | Admitting: *Deleted

## 2018-05-28 ENCOUNTER — Encounter (HOSPITAL_COMMUNITY): Payer: Self-pay

## 2018-05-28 ENCOUNTER — Ambulatory Visit (HOSPITAL_COMMUNITY)
Admission: EM | Admit: 2018-05-28 | Discharge: 2018-05-28 | Disposition: A | Discharge status: Nursing Home (Includes ICF) | Payer: BLUE CROSS/BLUE SHIELD | Attending: Internal Medicine | Admitting: Internal Medicine

## 2018-05-28 ENCOUNTER — Emergency Department (HOSPITAL_COMMUNITY): Payer: BLUE CROSS/BLUE SHIELD

## 2018-05-28 DIAGNOSIS — F4310 Post-traumatic stress disorder, unspecified: Secondary | ICD-10-CM | POA: Diagnosis not present

## 2018-05-28 DIAGNOSIS — J9601 Acute respiratory failure with hypoxia: Secondary | ICD-10-CM | POA: Diagnosis not present

## 2018-05-28 DIAGNOSIS — I509 Heart failure, unspecified: Secondary | ICD-10-CM | POA: Diagnosis not present

## 2018-05-28 DIAGNOSIS — Z818 Family history of other mental and behavioral disorders: Secondary | ICD-10-CM

## 2018-05-28 DIAGNOSIS — F17210 Nicotine dependence, cigarettes, uncomplicated: Secondary | ICD-10-CM | POA: Diagnosis not present

## 2018-05-28 DIAGNOSIS — I5082 Biventricular heart failure: Secondary | ICD-10-CM | POA: Diagnosis not present

## 2018-05-28 DIAGNOSIS — I370 Nonrheumatic pulmonary valve stenosis: Secondary | ICD-10-CM | POA: Diagnosis not present

## 2018-05-28 DIAGNOSIS — R1013 Epigastric pain: Secondary | ICD-10-CM | POA: Diagnosis not present

## 2018-05-28 DIAGNOSIS — F1510 Other stimulant abuse, uncomplicated: Secondary | ICD-10-CM | POA: Diagnosis not present

## 2018-05-28 DIAGNOSIS — R570 Cardiogenic shock: Secondary | ICD-10-CM | POA: Diagnosis not present

## 2018-05-28 DIAGNOSIS — N179 Acute kidney failure, unspecified: Secondary | ICD-10-CM | POA: Diagnosis not present

## 2018-05-28 DIAGNOSIS — Z79899 Other long term (current) drug therapy: Secondary | ICD-10-CM | POA: Diagnosis not present

## 2018-05-28 DIAGNOSIS — R0602 Shortness of breath: Secondary | ICD-10-CM

## 2018-05-28 DIAGNOSIS — Z23 Encounter for immunization: Secondary | ICD-10-CM

## 2018-05-28 DIAGNOSIS — Z7151 Drug abuse counseling and surveillance of drug abuser: Secondary | ICD-10-CM | POA: Diagnosis not present

## 2018-05-28 DIAGNOSIS — J811 Chronic pulmonary edema: Secondary | ICD-10-CM | POA: Diagnosis not present

## 2018-05-28 DIAGNOSIS — I4891 Unspecified atrial fibrillation: Secondary | ICD-10-CM

## 2018-05-28 DIAGNOSIS — F1010 Alcohol abuse, uncomplicated: Secondary | ICD-10-CM | POA: Diagnosis present

## 2018-05-28 DIAGNOSIS — Z22322 Carrier or suspected carrier of Methicillin resistant Staphylococcus aureus: Secondary | ICD-10-CM

## 2018-05-28 DIAGNOSIS — R590 Localized enlarged lymph nodes: Secondary | ICD-10-CM | POA: Diagnosis not present

## 2018-05-28 DIAGNOSIS — I361 Nonrheumatic tricuspid (valve) insufficiency: Secondary | ICD-10-CM | POA: Diagnosis not present

## 2018-05-28 DIAGNOSIS — F1123 Opioid dependence with withdrawal: Secondary | ICD-10-CM | POA: Diagnosis not present

## 2018-05-28 DIAGNOSIS — I110 Hypertensive heart disease with heart failure: Secondary | ICD-10-CM | POA: Diagnosis present

## 2018-05-28 DIAGNOSIS — J90 Pleural effusion, not elsewhere classified: Secondary | ICD-10-CM | POA: Diagnosis not present

## 2018-05-28 DIAGNOSIS — F1210 Cannabis abuse, uncomplicated: Secondary | ICD-10-CM | POA: Diagnosis present

## 2018-05-28 DIAGNOSIS — R109 Unspecified abdominal pain: Secondary | ICD-10-CM

## 2018-05-28 DIAGNOSIS — T6591XA Toxic effect of unspecified substance, accidental (unintentional), initial encounter: Secondary | ICD-10-CM | POA: Diagnosis not present

## 2018-05-28 DIAGNOSIS — Z452 Encounter for adjustment and management of vascular access device: Secondary | ICD-10-CM

## 2018-05-28 DIAGNOSIS — Z716 Tobacco abuse counseling: Secondary | ICD-10-CM | POA: Diagnosis not present

## 2018-05-28 DIAGNOSIS — I5021 Acute systolic (congestive) heart failure: Secondary | ICD-10-CM | POA: Diagnosis not present

## 2018-05-28 DIAGNOSIS — I5043 Acute on chronic combined systolic (congestive) and diastolic (congestive) heart failure: Secondary | ICD-10-CM | POA: Diagnosis not present

## 2018-05-28 DIAGNOSIS — F1310 Sedative, hypnotic or anxiolytic abuse, uncomplicated: Secondary | ICD-10-CM | POA: Diagnosis not present

## 2018-05-28 DIAGNOSIS — I428 Other cardiomyopathies: Secondary | ICD-10-CM | POA: Diagnosis present

## 2018-05-28 DIAGNOSIS — I340 Nonrheumatic mitral (valve) insufficiency: Secondary | ICD-10-CM | POA: Diagnosis not present

## 2018-05-28 LAB — CBC WITH DIFFERENTIAL/PLATELET
Abs Immature Granulocytes: 0.02 10*3/uL (ref 0.00–0.07)
Basophils Absolute: 0.1 10*3/uL (ref 0.0–0.1)
Basophils Relative: 1 %
Eosinophils Absolute: 0.2 10*3/uL (ref 0.0–0.5)
Eosinophils Relative: 3 %
HCT: 46.7 % (ref 39.0–52.0)
Hemoglobin: 14 g/dL (ref 13.0–17.0)
Immature Granulocytes: 0 %
LYMPHS ABS: 3.3 10*3/uL (ref 0.7–4.0)
Lymphocytes Relative: 40 %
MCH: 28.6 pg (ref 26.0–34.0)
MCHC: 30 g/dL (ref 30.0–36.0)
MCV: 95.5 fL (ref 80.0–100.0)
Monocytes Absolute: 0.6 10*3/uL (ref 0.1–1.0)
Monocytes Relative: 7 %
NRBC: 0 % (ref 0.0–0.2)
Neutro Abs: 4.1 10*3/uL (ref 1.7–7.7)
Neutrophils Relative %: 49 %
Platelets: 344 10*3/uL (ref 150–400)
RBC: 4.89 MIL/uL (ref 4.22–5.81)
RDW: 14.3 % (ref 11.5–15.5)
WBC: 8.3 10*3/uL (ref 4.0–10.5)

## 2018-05-28 LAB — BASIC METABOLIC PANEL
Anion gap: 7 (ref 5–15)
BUN: 13 mg/dL (ref 6–20)
CALCIUM: 8.6 mg/dL — AB (ref 8.9–10.3)
CO2: 27 mmol/L (ref 22–32)
CREATININE: 1.09 mg/dL (ref 0.61–1.24)
Chloride: 102 mmol/L (ref 98–111)
GFR calc non Af Amer: >60 mL/min (ref >60)
Glucose, Bld: 119 mg/dL — ABNORMAL HIGH (ref 70–99)
Potassium: 4.4 mmol/L (ref 3.5–5.1)
Sodium: 136 mmol/L (ref 135–145)

## 2018-05-28 LAB — I-STAT TROPONIN, ED: Troponin i, poc: 0.04 ng/mL (ref 0.00–0.08)

## 2018-05-28 LAB — RAPID URINE DRUG SCREEN, HOSP PERFORMED
Amphetamines: POSITIVE — AB
BENZODIAZEPINES: POSITIVE — AB
Barbiturates: NOT DETECTED
Cocaine: NOT DETECTED
Opiates: POSITIVE — AB
Tetrahydrocannabinol: POSITIVE — AB

## 2018-05-28 LAB — BRAIN NATRIURETIC PEPTIDE: B Natriuretic Peptide: 931.2 pg/mL — ABNORMAL HIGH (ref 0.0–100.0)

## 2018-05-28 LAB — TSH: TSH: 3.162 u[IU]/mL (ref 0.350–4.500)

## 2018-05-28 LAB — MAGNESIUM: Magnesium: 1.9 mg/dL (ref 1.7–2.4)

## 2018-05-28 MED ORDER — SODIUM CHLORIDE 0.9 % IV SOLN
INTRAVENOUS | Status: DC
  Administered 2018-06-01: 19:00:00 via INTRAVENOUS

## 2018-05-28 MED ORDER — FUROSEMIDE 10 MG/ML IJ SOLN
20.0000 mg | Freq: Once | INTRAMUSCULAR | Status: AC
  Administered 2018-05-28: 20 mg via INTRAVENOUS
  Filled 2018-05-28: qty 2

## 2018-05-28 MED ORDER — HEPARIN BOLUS VIA INFUSION
4000.0000 [IU] | Freq: Once | INTRAVENOUS | Status: AC
  Administered 2018-05-28: 4000 [IU] via INTRAVENOUS
  Filled 2018-05-28: qty 4000

## 2018-05-28 MED ORDER — DILTIAZEM LOAD VIA INFUSION
20.0000 mg | Freq: Once | INTRAVENOUS | Status: AC
  Administered 2018-05-28: 20 mg via INTRAVENOUS
  Filled 2018-05-28: qty 20

## 2018-05-28 MED ORDER — NITROGLYCERIN 0.4 MG SL SUBL
SUBLINGUAL_TABLET | SUBLINGUAL | Status: AC
  Filled 2018-05-28: qty 1

## 2018-05-28 MED ORDER — NITROGLYCERIN 0.4 MG SL SUBL: 0.4000 mg | SUBLINGUAL_TABLET | SUBLINGUAL | Status: DC | PRN

## 2018-05-28 MED ORDER — DILTIAZEM HCL-DEXTROSE 100-5 MG/100ML-% IV SOLN (PREMIX)
5.0000 mg/h | INTRAVENOUS | Status: DC
  Administered 2018-05-28: 5 mg/h via INTRAVENOUS
  Filled 2018-05-28 (×3): qty 100

## 2018-05-28 MED ORDER — ONDANSETRON HCL 4 MG/2ML IJ SOLN: 4.0000 mg | Freq: Four times a day (QID) | INTRAMUSCULAR | Status: DC | PRN

## 2018-05-28 MED ORDER — HEPARIN (PORCINE) 25000 UT/250ML-% IV SOLN
1500.0000 [IU]/h | INTRAVENOUS | Status: DC
  Administered 2018-05-28: 1200 [IU]/h via INTRAVENOUS
  Filled 2018-05-28: qty 250

## 2018-05-28 MED ORDER — ACETAMINOPHEN 325 MG PO TABS: 650.0000 mg | ORAL_TABLET | ORAL | Status: DC | PRN

## 2018-05-28 MED ORDER — IPRATROPIUM-ALBUTEROL 0.5-2.5 (3) MG/3ML IN SOLN
3.0000 mL | RESPIRATORY_TRACT | Status: DC | PRN
  Administered 2018-05-28: 3 mL via RESPIRATORY_TRACT
  Filled 2018-05-28: qty 3

## 2018-05-28 NOTE — ED Notes (Signed)
X-ray at bedside

## 2018-05-28 NOTE — ED Provider Notes (Signed)
Patient was triaged, EKG revealing A. fib with RVR, patient was quickly evaluated states that he had had shortness of breath and discomfort in his lower sternal area over the past week.  Worsens with exertion.  Denies chest pain.  Denies history of A. fib.  New onset A. fib, recommended further work-up and treatment and emergency room.  Patient stable upon discharge.  Patient transported to ED with nursing staff via wheelchair.   Lew Dawes, [place] 05/28/18 1757

## 2018-05-28 NOTE — ED Provider Notes (Signed)
[facility] EMERGENCY DEPARTMENT Provider Note   CSN: [id number] Arrival date & time: 05/28/18  1714     History   Chief Complaint Chief Complaint  Patient presents with  . Shortness of Breath    HPI Kenneth [name] is a 42 y.o. male.  42 yo M with a chief complaint shortness of breath.  Going on for the past week.  Worse on exertion improves with rest.  He was also having some substernal discomfort on extreme exertion.  He thought maybe that he had pulled a muscle.  Has had to sleep sitting up for the past 3 to 4 days.  Feels that his legs have been a little bit swollen.  He has had a couple episodes where he felt like he may pass out where he thought he saw stars which resolved spontaneously.  Denies current chest pain.  No shortness of breath while sitting on the bed.  He went to urgent care just prior to here and was noted to be in new A. fib with RVR.  He denies history of this in the past.  He does admit to using methamphetamines last use was 2 days ago.  Denies IV drugs.  The history is provided by the patient.  Shortness of Breath  This is a new problem. The average episode lasts 1 week. The problem occurs continuously.The current episode started more than 1 week ago. The problem has not changed since onset.Associated symptoms include chest pain and leg swelling. Pertinent negatives include no fever, no headaches, no vomiting, no abdominal pain and no rash. He has tried nothing for the symptoms. The treatment provided no relief. He has had no prior hospitalizations. He has had no prior ED visits. He has had no prior ICU admissions. Associated medical issues do not include asthma, COPD, pneumonia, PE, heart failure or past MI.    Past Medical History:  Diagnosis Date  . Frequent headaches   . Narcotic abuse (HCC)   . PTSD (post-traumatic stress disorder)     Patient Active Problem List   Diagnosis Date Noted  . New onset a-fib (HCC) 05/28/2018  . Acute  CHF (congestive heart failure) (HCC) 05/28/2018  . Methamphetamine abuse (HCC) 05/28/2018  . PTSD (post-traumatic stress disorder) 05/20/2015  . 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 Frequent headaches 05/20/2015    Past Surgical History:  Procedure Laterality Date  . TONSILLECTOMY          Home Medications    Prior to Admission medications   Medication Sig Start Date End Date Taking? Authorizing Provider  ALPRAZolam Prudy Feeler) 1 MG tablet Take 1 mg by mouth daily as needed for anxiety.   Yes [provider]    Family History Family History  Problem Relation Age of Onset  . Miscarriages / [place] Brother   . Schizophrenia Brother     Social History Social History   Tobacco Use  . Smoking status: Current Some Day Smoker    Packs/day: 1.00    Years: 0.50    Pack years: 0.50    Types: Cigarettes  . Smokeless tobacco: Current User    Types: Snuff  Substance Use Topics  . Alcohol use: No    Alcohol/week: 0.0 standard drinks  . Drug use: No    Types: Cocaine, Marijuana     Allergies   Patient has no known allergies.   Review of Systems Review of Systems  Constitutional: Negative for chills and fever.  HENT: Negative for  congestion and facial swelling.   Eyes: Negative for discharge and visual disturbance.  Respiratory: Positive for shortness of breath.   Cardiovascular: Positive for chest pain and leg swelling. Negative for palpitations.  Gastrointestinal: Negative for abdominal pain, diarrhea and vomiting.  Musculoskeletal: Negative for arthralgias and myalgias.  Skin: Negative for color change and rash.  Neurological: Negative for tremors, syncope and headaches.  Psychiatric/Behavioral: Negative for confusion and dysphoric mood.     Physical Exam Updated Vital Signs BP 108/83   Pulse 73   Temp (!) 97.4 F (36.3 C) (Oral)   Resp 20   Ht 6\' 2"  (1.88 m)   Wt 86.2 kg   SpO2 95%   BMI 24.39 kg/m   Physical Exam Vitals signs  and nursing note reviewed.  Constitutional:      Appearance: He is well-developed.  HENT:     Head: Normocephalic and atraumatic.  Eyes:     Pupils: Pupils are equal, round, and reactive to light.  Neck:     Musculoskeletal: Normal range of motion and neck supple.     Vascular: JVD ( Up to the angle of the jaw) present.  Cardiovascular:     Rate and Rhythm: Normal rate and regular rhythm.     Heart sounds: No murmur. No friction rub. No gallop.   Pulmonary:     Effort: No respiratory distress.     Breath sounds: Examination of the right-lower field reveals rales. Examination of the left-lower field reveals rales. Rales present. No wheezing.  Abdominal:     General: There is no distension.     Tenderness: There is no guarding or rebound.  Musculoskeletal: Normal range of motion.     Right lower leg: Edema present.     Left lower leg: Edema present.     Comments: 1+ up to the knees bilaterally  Skin:    Coloration: Skin is not pale.     Findings: No rash.  Neurological:     Mental Status: He is alert and oriented to person, place, and time.  Psychiatric:        Behavior: Behavior normal.      ED Treatments / Results  Labs (all labs ordered are listed, but only abnormal results are displayed) Labs Reviewed  BASIC METABOLIC PANEL - Abnormal; Notable for the following components:      Result Value   Glucose, Bld 119 (*)    Calcium 8.6 (*)    All other components within normal limits  BRAIN NATRIURETIC PEPTIDE - Abnormal; Notable for the following components:   B Natriuretic Peptide 931.2 (*)    All other components within normal limits  CBC WITH DIFFERENTIAL/PLATELET  TSH  MAGNESIUM  HEPARIN LEVEL (UNFRACTIONATED)  CBC  HIV ANTIBODY (ROUTINE TESTING W REFLEX)  RAPID URINE DRUG SCREEN, HOSP PERFORMED  I-STAT TROPONIN, ED    EKG EKG Interpretation  Date/Time:  Sunday May 28 2018 17:25:38 EST Ventricular Rate:  152 PR Interval:    QRS Duration: 89 QT  Interval:  303 QTC Calculation: 482 R Axis:   109 Text Interpretation:  Atrial fibrillation Probable lateral infarct, age indeterminate Anterior infarct, old Baseline wander in lead(s) V5 No significant change since last tracing Confirmed by Melene Plan [phone]) on 05/28/2018 6:04:40 PM   Radiology Dg Chest Portable 1 View  Result Date: 05/28/2018 CLINICAL DATA:  Shortness of breath EXAM: PORTABLE CHEST 1 VIEW COMPARISON:  04/08/2009 FINDINGS: Cardiomegaly with vascular congestion and interstitial prominence, likely interstitial edema. No effusions or acute  bony abnormality. IMPRESSION: Mild-to-moderate interstitial edema/CHF. Electronically Signed   By: Charlett Nose M.D.   On: 05/28/2018 18:31    Procedures Procedures (including critical care time)  Medications Ordered in ED Medications  diltiazem (CARDIZEM) 1 mg/mL load via infusion 20 mg (20 mg Intravenous Bolus from Bag 05/28/18 1807)    And  diltiazem (CARDIZEM) 100 mg in dextrose 5% (1 mg/mL) infusion (5 mg/hr Intravenous Transfusing/Transfer 05/28/18 2021)  heparin ADULT infusion 100 units/mL (25000 units/274mL sodium chloride 0.45%) (1,200 Units/hr Intravenous Transfusing/Transfer 05/28/18 2021)  acetaminophen (TYLENOL) tablet 650 mg (has no administration in time range)  ondansetron (ZOFRAN) injection 4 mg (has no administration in time range)  furosemide (LASIX) injection 20 mg (20 mg Intravenous Given 05/28/18 1905)  heparin bolus via infusion 4,000 Units (4,000 Units Intravenous Bolus from Bag 05/28/18 1926)     Initial Impression / Assessment and Plan / ED Course  I have reviewed the triage vital signs and the nursing notes.  Pertinent labs & imaging results that were available during my care of the patient were reviewed by me and considered in my medical decision making (see chart for details).     42 yo M with a chief complaint of shortness of breath.  Patient found to be new onset A. fib with RVR.  Symptoms  going on for the last week.  He also shows signs of acute heart failure with new lower extremity edema JVD to the jaw and rales in the lungs.  Chest x-ray viewed by me with increased pulmonary congestion.  We will start the patient on a diltiazem drip he is going into rates as high as 170.  Will give a dose of Lasix.  Rates have improved on a diltiazem drip.  I discussed the case with the cardiology fellow, Dr. Shelby Dubin, she recommended that I have the patient admitted to the medical service, agreed that the patient needed an echo and if the patient had diminished EF then that should be re-contacted to see the patient in consult.  CRITICAL CARE Performed by: Rae Roam   Total critical care time: 35 minutes  Critical care time was exclusive of separately billable procedures and treating other patients.  Critical care was necessary to treat or prevent imminent or life-threatening deterioration.  Critical care was time spent personally by me on the following activities: development of treatment plan with patient and/or surrogate as well as nursing, discussions with consultants, evaluation of patient's response to treatment, examination of patient, obtaining history from patient or surrogate, ordering and performing treatments and interventions, ordering and review of laboratory studies, ordering and review of radiographic studies, pulse oximetry and re-evaluation of patient's condition.  The patients results and plan were reviewed and discussed.   Any x-rays performed were independently reviewed by myself.   Differential diagnosis were considered with the presenting HPI.  Medications  diltiazem (CARDIZEM) 1 mg/mL load via infusion 20 mg (20 mg Intravenous Bolus from Bag 05/28/18 1807)    And  diltiazem (CARDIZEM) 100 mg in dextrose 5% (1 mg/mL) infusion (5 mg/hr Intravenous Transfusing/Transfer 05/28/18 2021)  heparin ADULT infusion 100 units/mL (25000 units/238mL  sodium chloride 0.45%) (1,200 Units/hr Intravenous Transfusing/Transfer 05/28/18 2021)  acetaminophen (TYLENOL) tablet 650 mg (has no administration in time range)  ondansetron (ZOFRAN) injection 4 mg (has no administration in time range)  furosemide (LASIX) injection 20 mg (20 mg Intravenous Given 05/28/18 1905)  heparin bolus via infusion 4,000 Units (4,000 Units Intravenous Bolus from Bag 05/28/18 1926)  Vitals:   05/28/18 1927 05/28/18 1954 05/28/18 2010 05/28/18 2015  BP: 120/86  108/83   Pulse: (!) 49 (!) 55  73  Resp: 16 14  20   Temp:      TempSrc:      SpO2: 97% 95%    Weight:      Height:        Final diagnoses:  Atrial fibrillation with rapid ventricular response (HCC)    Admission/ observation were discussed with the admitting physician, patient and/or family and they are comfortable with the plan.   Final Clinical Impressions(s) / ED Diagnoses   Final diagnoses:  Atrial fibrillation with rapid ventricular response [facility])    ED Discharge Orders    None       Melene Plan, DO 05/28/18 2033

## 2018-05-28 NOTE — H&P (Addendum)
History and Physical    Theoplis Hauger MHW:[id number] DOB: 01-02-1976 DOA: 05/28/2018  PCP: Patient, No Pcp Per  Patient coming from: Home  I have personally briefly reviewed patient's old medical records in [facility]  Chief Complaint: SOB  HPI: Dameir Luchini is a 42 y.o. male with medical history significant of substance abuse including amphetamine.  Patient presents to the ED with c/o SOB.  Ongoing for the past 1 week.  Worse with exertion, better at rest.  Substernal discomfort on extreme exertion.  Has had to sleep sitting up for past 3-4 days.  Some leg swelling.  No current CP.   ED Course: A.Fib RVR with rates as high as 170.  Pulm edema on CXR.  Cardizem gtt started, 20mg  lasix given and hospitalist asked to admit.   Review of Systems: As per HPI otherwise 10 point review of systems negative.   Past Medical History:  Diagnosis Date  . Frequent headaches   . Narcotic abuse (HCC)   . PTSD (post-traumatic stress disorder)     Past Surgical History:  Procedure Laterality Date  . TONSILLECTOMY       reports that he has been smoking cigarettes. He has a 0.50 pack-year smoking history. His smokeless tobacco use includes snuff. He reports that he does not drink alcohol or use drugs.  No Known Allergies  Family History  Problem Relation Age of Onset  . Miscarriages / [place] Brother   . Schizophrenia Brother      Prior to Admission medications   Medication Sig Start Date End Date Taking? Authorizing Provider  ALPRAZolam Prudy Feeler) 1 MG tablet Take 1 mg by mouth daily as needed for anxiety.   Yes [provider]    Physical Exam: Vitals:   05/28/18 1915 05/28/18 1924 05/28/18 1927 05/28/18 1954  BP:  97/84 120/86   Pulse: 83 (!) 129 (!) 49 (!) 55  Resp: 16 16 16 14   Temp:      TempSrc:      SpO2: 95% 100% 97% 95%  Weight:      Height:        Constitutional: NAD, calm, comfortable Eyes: PERRL, lids and conjunctivae normal ENMT: Mucous  membranes are moist. Posterior pharynx clear of any exudate or lesions.Normal dentition.  Neck: normal, supple, no masses, no thyromegaly, JVD Respiratory: Rales Cardiovascular: IRR, IRR, tachycardic, 1+ edema BLE Abdomen: no tenderness, no masses palpated. No hepatosplenomegaly. Bowel sounds positive.  Musculoskeletal: no clubbing / cyanosis. No joint deformity upper and lower extremities. Good ROM, no contractures. Normal muscle tone.  Skin: no rashes, lesions, ulcers. No induration Neurologic: CN 2-12 grossly intact. Sensation intact, DTR normal. Strength 5/5 in all 4.  Psychiatric: Normal judgment and insight. Alert and oriented x 3. Normal mood.    Labs on Admission: I have personally reviewed following labs and imaging studies  CBC: Recent Labs  Lab 05/28/18 1731  WBC 8.3  NEUTROABS 4.1  HGB 14.0  HCT 46.7  MCV 95.5  PLT 344   Basic Metabolic Panel: Recent Labs  Lab 05/28/18 1731  NA 136  K 4.4  CL 102  CO2 27  GLUCOSE 119*  BUN 13  CREATININE 1.09  CALCIUM 8.6*  MG 1.9   GFR: Estimated Creatinine Clearance: 103.7 mL/min (by C-G formula based on SCr of 1.09 mg/dL). Liver Function Tests: No results for input(s): AST, ALT, ALKPHOS, BILITOT, PROT, ALBUMIN in the last 168 hours. No results for input(s): LIPASE, AMYLASE in the last 168 hours.  No results for input(s): AMMONIA in the last 168 hours. Coagulation Profile: No results for input(s): INR, PROTIME in the last 168 hours. Cardiac Enzymes: No results for input(s): CKTOTAL, CKMB, CKMBINDEX, TROPONINI in the last 168 hours. BNP (last 3 results) No results for input(s): PROBNP in the last 8760 hours. HbA1C: No results for input(s): HGBA1C in the last 72 hours. CBG: No results for input(s): GLUCAP in the last 168 hours. Lipid Profile: No results for input(s): CHOL, HDL, LDLCALC, TRIG, CHOLHDL, LDLDIRECT in the last 72 hours. Thyroid Function Tests: Recent Labs    05/28/18 1755  TSH 3.162   Anemia  Panel: No results for input(s): VITAMINB12, FOLATE, FERRITIN, TIBC, IRON, RETICCTPCT in the last 72 hours. Urine analysis: No results found for: COLORURINE, APPEARANCEUR, LABSPEC, PHURINE, GLUCOSEU, HGBUR, BILIRUBINUR, KETONESUR, PROTEINUR, UROBILINOGEN, NITRITE, LEUKOCYTESUR  Radiological Exams on Admission: Dg Chest Portable 1 View  Result Date: 05/28/2018 CLINICAL DATA:  Shortness of breath EXAM: PORTABLE CHEST 1 VIEW COMPARISON:  04/08/2009 FINDINGS: Cardiomegaly with vascular congestion and interstitial prominence, likely interstitial edema. No effusions or acute bony abnormality. IMPRESSION: Mild-to-moderate interstitial edema/CHF. Electronically Signed   By: Charlett Nose M.D.   On: 05/28/2018 18:31    EKG: Independently reviewed.  Assessment/Plan Principal Problem:   New onset a-fib (HCC) Active Problems:   Acute CHF (congestive heart failure) (HCC)   Methamphetamine abuse (HCC)    1. New onset A.Fib - 1. A.Fib pathway 2. cardizem gtt 3. Heparin gtt for now 4. Though CHADS-VASc is only 0 or 1 depending on EF 5. EDP spoke with cards: 1. Get 2d echo 2. Call them back for formal consult if pt has reduced EF 6. BP noted to be running on the soft side 7. If unable to control HR due to hypotension with increasing cardizem gtt, will need to call cards again tonight. 2. New onset Acute CHF - (I do not yet know at this time wether this is systolic or diastolic, echo is needed to determine this). 1. ? Rate induced? 2. 2d echo pending 3. 20mg  lasix in ED 4. Strict intake and output 5. Will hold off on ordering further diuretics for the moment pending seeing how he does overnight with the 20 and rate control of a.fib 3. Methamphetamine abuse - 1. last use was 2 days ago 2. denies IVDU 3. Getting UDS  DVT prophylaxis: Heparin gtt Code Status: Full Family Communication: Family at bedside Disposition Plan: Home after admit Consults called: None Admission status: Place in  obs    , Heywood Iles. DO [facility] Pager [phone] Only works nights!  If 7AM-7PM, please contact the primary day team physician taking care of patient  [email or web address] Password [email or web address]  05/28/2018, 8:10 PM

## 2018-05-28 NOTE — ED Triage Notes (Addendum)
Pt sent to UC, pt endorses SOB x1 week. Pt reports it is hard to lay flat and hard to walk around. Pt is normally very active so this is abnormal. Pt reports he was told at [facility] he had irretgular HR, no hx of to his knowledge.Pt denies pain, but reports some chest tightness saying its hard to take a deep breath. Pt reports some wheezing, denies hx of asthma or COPD. Pt denies cough. Pt reports 10 pound weight gain in the past week.

## 2018-05-28 NOTE — ED Triage Notes (Signed)
C/O feeling very winded, fatigued, SOB and "full sensation" in epigastrum x 1 wk.  Also c/o intermittent nausea, weight gain of 10# over past week, inability to lay flat at night. Denies any chest pain or nausea at this time.  HR irregular to palpation.

## 2018-05-28 NOTE — ED Notes (Signed)
Dilt paused due to BP.

## 2018-05-28 NOTE — ED Notes (Signed)
Patient deferred to [facility] ED due to new on set of Afib per H. Weiters PAC. Patient escorted to [facility] ED via wheel chair by C. Soto-RN.

## 2018-05-28 NOTE — Progress Notes (Signed)
ANTICOAGULATION CONSULT NOTE - Initial Consult  Pharmacy Consult for Heparin Indication: atrial fibrillation  No Known Allergies  Patient Measurements: Height: 6\' 2"  (188 cm) Weight: 190 lb (86.2 kg) IBW/kg (Calculated) : 82.2  Vital Signs: Temp: 97.4 F (36.3 C) (12/15 1725) Temp Source: Oral (12/15 1725) BP: 87/63 (12/15 1858) Pulse Rate: 120 (12/15 1858)  Labs: Recent Labs    05/28/18 1731  HGB 14.0  HCT 46.7  PLT 344  CREATININE 1.09    Estimated Creatinine Clearance: 103.7 mL/min (by C-G formula based on SCr of 1.09 mg/dL).   Medical History: Past Medical History:  Diagnosis Date  . Frequent headaches   . Narcotic abuse (HCC)   . PTSD (post-traumatic stress disorder)    Assessment: 42 year old male admitted with SOB and weight gain.  To begin heparin for Afib.  Goal of Therapy:  Heparin level 0.3-0.7 units/ml Monitor platelets by anticoagulation protocol: Yes   Plan:  Heparin 4000 units iv bolus x 1 Heparin 1200 units / hr Heparin level 6 hours after heparin starts Daily heparin level, CBC  Thank you Okey Regal, PharmD [phone]  05/28/2018,7:05 PM

## 2018-05-28 NOTE — ED Notes (Signed)
Heparin verified with Kayla, RN 

## 2018-05-28 NOTE — ED Notes (Signed)
Dilt resumed

## 2018-05-28 NOTE — ED Notes (Signed)
ED Provider at bedside. 

## 2018-05-29 ENCOUNTER — Observation Stay (HOSPITAL_COMMUNITY): Payer: BLUE CROSS/BLUE SHIELD

## 2018-05-29 ENCOUNTER — Other Ambulatory Visit: Payer: Self-pay

## 2018-05-29 ENCOUNTER — Inpatient Hospital Stay (HOSPITAL_COMMUNITY): Payer: BLUE CROSS/BLUE SHIELD

## 2018-05-29 ENCOUNTER — Inpatient Hospital Stay: Payer: Self-pay

## 2018-05-29 ENCOUNTER — Encounter (HOSPITAL_COMMUNITY): Payer: Self-pay

## 2018-05-29 ENCOUNTER — Observation Stay (HOSPITAL_BASED_OUTPATIENT_CLINIC_OR_DEPARTMENT_OTHER): Payer: BLUE CROSS/BLUE SHIELD

## 2018-05-29 DIAGNOSIS — Z716 Tobacco abuse counseling: Secondary | ICD-10-CM | POA: Diagnosis not present

## 2018-05-29 DIAGNOSIS — I340 Nonrheumatic mitral (valve) insufficiency: Secondary | ICD-10-CM

## 2018-05-29 DIAGNOSIS — I5021 Acute systolic (congestive) heart failure: Secondary | ICD-10-CM | POA: Diagnosis not present

## 2018-05-29 DIAGNOSIS — I5082 Biventricular heart failure: Secondary | ICD-10-CM | POA: Diagnosis present

## 2018-05-29 DIAGNOSIS — F4310 Post-traumatic stress disorder, unspecified: Secondary | ICD-10-CM | POA: Diagnosis present

## 2018-05-29 DIAGNOSIS — I110 Hypertensive heart disease with heart failure: Secondary | ICD-10-CM | POA: Diagnosis present

## 2018-05-29 DIAGNOSIS — J811 Chronic pulmonary edema: Secondary | ICD-10-CM | POA: Diagnosis not present

## 2018-05-29 DIAGNOSIS — I428 Other cardiomyopathies: Secondary | ICD-10-CM | POA: Diagnosis present

## 2018-05-29 DIAGNOSIS — N179 Acute kidney failure, unspecified: Secondary | ICD-10-CM | POA: Diagnosis not present

## 2018-05-29 DIAGNOSIS — I361 Nonrheumatic tricuspid (valve) insufficiency: Secondary | ICD-10-CM | POA: Diagnosis not present

## 2018-05-29 DIAGNOSIS — I4891 Unspecified atrial fibrillation: Secondary | ICD-10-CM | POA: Diagnosis present

## 2018-05-29 DIAGNOSIS — R590 Localized enlarged lymph nodes: Secondary | ICD-10-CM | POA: Diagnosis present

## 2018-05-29 DIAGNOSIS — F1310 Sedative, hypnotic or anxiolytic abuse, uncomplicated: Secondary | ICD-10-CM | POA: Diagnosis present

## 2018-05-29 DIAGNOSIS — F1010 Alcohol abuse, uncomplicated: Secondary | ICD-10-CM | POA: Diagnosis present

## 2018-05-29 DIAGNOSIS — I5043 Acute on chronic combined systolic (congestive) and diastolic (congestive) heart failure: Secondary | ICD-10-CM | POA: Diagnosis present

## 2018-05-29 DIAGNOSIS — Z23 Encounter for immunization: Secondary | ICD-10-CM | POA: Diagnosis not present

## 2018-05-29 DIAGNOSIS — I370 Nonrheumatic pulmonary valve stenosis: Secondary | ICD-10-CM

## 2018-05-29 DIAGNOSIS — Z79899 Other long term (current) drug therapy: Secondary | ICD-10-CM | POA: Diagnosis not present

## 2018-05-29 DIAGNOSIS — F1210 Cannabis abuse, uncomplicated: Secondary | ICD-10-CM | POA: Diagnosis present

## 2018-05-29 DIAGNOSIS — R570 Cardiogenic shock: Secondary | ICD-10-CM

## 2018-05-29 DIAGNOSIS — Z22322 Carrier or suspected carrier of Methicillin resistant Staphylococcus aureus: Secondary | ICD-10-CM | POA: Diagnosis not present

## 2018-05-29 DIAGNOSIS — F1123 Opioid dependence with withdrawal: Secondary | ICD-10-CM | POA: Diagnosis not present

## 2018-05-29 DIAGNOSIS — T6591XA Toxic effect of unspecified substance, accidental (unintentional), initial encounter: Secondary | ICD-10-CM | POA: Diagnosis present

## 2018-05-29 DIAGNOSIS — I509 Heart failure, unspecified: Secondary | ICD-10-CM | POA: Diagnosis not present

## 2018-05-29 DIAGNOSIS — J9601 Acute respiratory failure with hypoxia: Secondary | ICD-10-CM | POA: Diagnosis not present

## 2018-05-29 DIAGNOSIS — J90 Pleural effusion, not elsewhere classified: Secondary | ICD-10-CM | POA: Diagnosis not present

## 2018-05-29 DIAGNOSIS — F1510 Other stimulant abuse, uncomplicated: Secondary | ICD-10-CM | POA: Diagnosis present

## 2018-05-29 DIAGNOSIS — F17210 Nicotine dependence, cigarettes, uncomplicated: Secondary | ICD-10-CM | POA: Diagnosis present

## 2018-05-29 DIAGNOSIS — Z7151 Drug abuse counseling and surveillance of drug abuser: Secondary | ICD-10-CM | POA: Diagnosis not present

## 2018-05-29 DIAGNOSIS — Z818 Family history of other mental and behavioral disorders: Secondary | ICD-10-CM | POA: Diagnosis not present

## 2018-05-29 LAB — ECHOCARDIOGRAM COMPLETE
Height: 74 in
Weight: 3028.8 oz

## 2018-05-29 LAB — CBC
HCT: 40.6 % (ref 39.0–52.0)
Hemoglobin: 13.1 g/dL (ref 13.0–17.0)
MCH: 29.4 pg (ref 26.0–34.0)
MCHC: 32.3 g/dL (ref 30.0–36.0)
MCV: 91 fL (ref 80.0–100.0)
Platelets: 321 10*3/uL (ref 150–400)
RBC: 4.46 MIL/uL (ref 4.22–5.81)
RDW: 14.1 % (ref 11.5–15.5)
WBC: 9.4 10*3/uL (ref 4.0–10.5)
nRBC: 0 % (ref 0.0–0.2)

## 2018-05-29 LAB — HEPARIN LEVEL (UNFRACTIONATED): Heparin Unfractionated: 0.12 IU/mL — ABNORMAL LOW (ref 0.30–0.70)

## 2018-05-29 LAB — MRSA PCR SCREENING: MRSA by PCR: POSITIVE — AB

## 2018-05-29 LAB — BRAIN NATRIURETIC PEPTIDE: B Natriuretic Peptide: 696.2 pg/mL — ABNORMAL HIGH (ref 0.0–100.0)

## 2018-05-29 LAB — HIV ANTIBODY (ROUTINE TESTING W REFLEX): HIV Screen 4th Generation wRfx: NONREACTIVE

## 2018-05-29 MED ORDER — FOLIC ACID 1 MG PO TABS
1.0000 mg | ORAL_TABLET | Freq: Every day | ORAL | Status: DC
  Administered 2018-05-29 – 2018-06-01 (×4): 1 mg via ORAL
  Filled 2018-05-29 (×4): qty 1

## 2018-05-29 MED ORDER — BUPRENORPHINE HCL-NALOXONE HCL 2-0.5 MG SL SUBL
1.0000 | SUBLINGUAL_TABLET | SUBLINGUAL | Status: AC | PRN
  Filled 2018-05-29: qty 1

## 2018-05-29 MED ORDER — PNEUMOCOCCAL VAC POLYVALENT 25 MCG/0.5ML IJ INJ
0.5000 mL | INJECTION | INTRAMUSCULAR | Status: AC
  Administered 2018-05-31: 0.5 mL via INTRAMUSCULAR
  Filled 2018-05-29: qty 0.5

## 2018-05-29 MED ORDER — IOHEXOL 300 MG/ML  SOLN
75.0000 mL | Freq: Once | INTRAMUSCULAR | Status: AC | PRN
  Administered 2018-05-29: 75 mL via INTRAVENOUS

## 2018-05-29 MED ORDER — METOPROLOL TARTRATE 25 MG PO TABS
25.0000 mg | ORAL_TABLET | Freq: Two times a day (BID) | ORAL | Status: DC
  Administered 2018-05-29: 25 mg via ORAL
  Filled 2018-05-29: qty 1

## 2018-05-29 MED ORDER — ASPIRIN 81 MG PO CHEW
81.0000 mg | CHEWABLE_TABLET | Freq: Every day | ORAL | Status: DC
  Administered 2018-05-29 – 2018-06-04 (×7): 81 mg via ORAL
  Filled 2018-05-29 (×7): qty 1

## 2018-05-29 MED ORDER — SODIUM CHLORIDE 0.9% FLUSH
10.0000 mL | Freq: Two times a day (BID) | INTRAVENOUS | Status: DC
  Administered 2018-05-30 (×2): 10 mL
  Administered 2018-05-31: 30 mL
  Administered 2018-05-31: 10 mL

## 2018-05-29 MED ORDER — LORAZEPAM 2 MG/ML IJ SOLN
0.0000 mg | INTRAMUSCULAR | Status: AC
  Administered 2018-05-29 – 2018-05-30 (×4): 2 mg via INTRAVENOUS
  Administered 2018-05-30 (×4): 1 mg via INTRAVENOUS
  Administered 2018-05-31 (×2): 2 mg via INTRAVENOUS
  Filled 2018-05-29 (×10): qty 1

## 2018-05-29 MED ORDER — INFLUENZA VAC SPLIT QUAD 0.5 ML IM SUSY
0.5000 mL | PREFILLED_SYRINGE | INTRAMUSCULAR | Status: AC
  Administered 2018-05-31: 0.5 mL via INTRAMUSCULAR
  Filled 2018-05-29: qty 0.5

## 2018-05-29 MED ORDER — AMIODARONE LOAD VIA INFUSION
150.0000 mg | Freq: Once | INTRAVENOUS | Status: AC
  Administered 2018-05-29: 150 mg via INTRAVENOUS
  Filled 2018-05-29: qty 83.34

## 2018-05-29 MED ORDER — SODIUM CHLORIDE 0.9% FLUSH: 10.0000 mL | INTRAVENOUS | Status: DC | PRN

## 2018-05-29 MED ORDER — THIAMINE HCL 100 MG/ML IJ SOLN
100.0000 mg | Freq: Every day | INTRAMUSCULAR | Status: DC
  Filled 2018-05-29: qty 2

## 2018-05-29 MED ORDER — LORAZEPAM 2 MG/ML IJ SOLN
0.0000 mg | Freq: Two times a day (BID) | INTRAMUSCULAR | Status: AC
  Administered 2018-06-01: 1 mg via INTRAVENOUS
  Filled 2018-05-29: qty 1

## 2018-05-29 MED ORDER — HEPARIN (PORCINE) 25000 UT/250ML-% IV SOLN
1500.0000 [IU]/h | INTRAVENOUS | Status: DC
  Administered 2018-05-29: 1500 [IU]/h via INTRAVENOUS
  Filled 2018-05-29: qty 250

## 2018-05-29 MED ORDER — MORPHINE SULFATE (PF) 2 MG/ML IV SOLN
1.0000 mg | Freq: Once | INTRAVENOUS | Status: AC
  Administered 2018-05-29: 1 mg via INTRAVENOUS
  Filled 2018-05-29: qty 1

## 2018-05-29 MED ORDER — LORAZEPAM 1 MG PO TABS
1.0000 mg | ORAL_TABLET | Freq: Four times a day (QID) | ORAL | Status: AC | PRN
  Administered 2018-05-31 – 2018-06-01 (×2): 1 mg via ORAL
  Filled 2018-05-29 (×2): qty 1

## 2018-05-29 MED ORDER — PERFLUTREN LIPID MICROSPHERE
1.0000 mL | INTRAVENOUS | Status: AC | PRN
  Administered 2018-05-29: 3 mL via INTRAVENOUS
  Filled 2018-05-29: qty 10

## 2018-05-29 MED ORDER — BUPRENORPHINE HCL-NALOXONE HCL 8-2 MG SL SUBL
1.0000 | SUBLINGUAL_TABLET | Freq: Two times a day (BID) | SUBLINGUAL | Status: DC
  Administered 2018-05-30 – 2018-06-04 (×11): 1 via SUBLINGUAL
  Filled 2018-05-29 (×11): qty 1

## 2018-05-29 MED ORDER — AMIODARONE HCL IN DEXTROSE 360-4.14 MG/200ML-% IV SOLN
60.0000 mg/h | INTRAVENOUS | Status: AC
  Administered 2018-05-29: 60 mg/h via INTRAVENOUS
  Filled 2018-05-29 (×2): qty 200

## 2018-05-29 MED ORDER — HEPARIN SODIUM (PORCINE) 5000 UNIT/ML IJ SOLN: 5000.0000 [IU] | Freq: Three times a day (TID) | INTRAMUSCULAR | Status: DC

## 2018-05-29 MED ORDER — ADULT MULTIVITAMIN W/MINERALS CH
1.0000 | ORAL_TABLET | Freq: Every day | ORAL | Status: DC
  Administered 2018-05-29 – 2018-06-01 (×4): 1 via ORAL
  Filled 2018-05-29 (×4): qty 1

## 2018-05-29 MED ORDER — CHLORDIAZEPOXIDE HCL 5 MG PO CAPS
5.0000 mg | ORAL_CAPSULE | Freq: Three times a day (TID) | ORAL | Status: DC
  Administered 2018-05-29 – 2018-06-01 (×10): 5 mg via ORAL
  Filled 2018-05-29 (×10): qty 1

## 2018-05-29 MED ORDER — FUROSEMIDE 10 MG/ML IJ SOLN
80.0000 mg | Freq: Two times a day (BID) | INTRAMUSCULAR | Status: DC
  Administered 2018-05-29: 80 mg via INTRAVENOUS
  Filled 2018-05-29: qty 8

## 2018-05-29 MED ORDER — CHLORHEXIDINE GLUCONATE CLOTH 2 % EX PADS
6.0000 | MEDICATED_PAD | Freq: Every day | CUTANEOUS | Status: DC
  Administered 2018-05-30 – 2018-06-02 (×4): 6 via TOPICAL

## 2018-05-29 MED ORDER — AMIODARONE HCL IN DEXTROSE 360-4.14 MG/200ML-% IV SOLN
30.0000 mg/h | INTRAVENOUS | Status: DC
  Administered 2018-05-29: 59.94 mg/h via INTRAVENOUS
  Administered 2018-05-30 – 2018-06-04 (×11): 30 mg/h via INTRAVENOUS
  Filled 2018-05-29 (×11): qty 200

## 2018-05-29 MED ORDER — HEPARIN BOLUS VIA INFUSION
4000.0000 [IU] | Freq: Once | INTRAVENOUS | Status: AC
  Administered 2018-05-29: 4000 [IU] via INTRAVENOUS
  Filled 2018-05-29: qty 4000

## 2018-05-29 MED ORDER — IOHEXOL 300 MG/ML  SOLN
100.0000 mL | Freq: Once | INTRAMUSCULAR | Status: AC | PRN
  Administered 2018-05-29: 100 mL via INTRAVENOUS

## 2018-05-29 MED ORDER — FENTANYL CITRATE (PF) 100 MCG/2ML IJ SOLN
INTRAMUSCULAR | Status: AC
  Filled 2018-05-29: qty 2

## 2018-05-29 MED ORDER — DIGOXIN 125 MCG PO TABS: 0.1250 mg | ORAL_TABLET | Freq: Every day | ORAL | Status: DC

## 2018-05-29 MED ORDER — MAGNESIUM SULFATE IN D5W 1-5 GM/100ML-% IV SOLN
1.0000 g | Freq: Once | INTRAVENOUS | Status: AC
  Administered 2018-05-29: 1 g via INTRAVENOUS
  Filled 2018-05-29: qty 100

## 2018-05-29 MED ORDER — MIDAZOLAM HCL 2 MG/2ML IJ SOLN
INTRAMUSCULAR | Status: AC
  Filled 2018-05-29: qty 2

## 2018-05-29 MED ORDER — DIGOXIN 0.25 MG/ML IJ SOLN
0.2500 mg | Freq: Four times a day (QID) | INTRAMUSCULAR | Status: DC
  Administered 2018-05-29: 0.25 mg via INTRAVENOUS
  Filled 2018-05-29: qty 2

## 2018-05-29 MED ORDER — CHLORDIAZEPOXIDE HCL 5 MG PO CAPS
20.0000 mg | ORAL_CAPSULE | Freq: Three times a day (TID) | ORAL | Status: DC
  Administered 2018-05-29: 20 mg via ORAL
  Filled 2018-05-29: qty 4

## 2018-05-29 MED ORDER — HEPARIN BOLUS VIA INFUSION
3000.0000 [IU] | Freq: Once | INTRAVENOUS | Status: AC
  Administered 2018-05-29: 3000 [IU] via INTRAVENOUS
  Filled 2018-05-29: qty 3000

## 2018-05-29 MED ORDER — VITAMIN B-1 100 MG PO TABS
100.0000 mg | ORAL_TABLET | Freq: Every day | ORAL | Status: DC
  Administered 2018-05-29 – 2018-06-01 (×4): 100 mg via ORAL
  Filled 2018-05-29 (×4): qty 1

## 2018-05-29 MED ORDER — LORAZEPAM 2 MG/ML IJ SOLN
1.0000 mg | Freq: Four times a day (QID) | INTRAMUSCULAR | Status: AC | PRN
  Administered 2018-05-29 – 2018-05-31 (×2): 1 mg via INTRAVENOUS
  Filled 2018-05-29 (×2): qty 1

## 2018-05-29 NOTE — Progress Notes (Signed)
Pt c/o of intractable abdominal pain radiating up neck bilaterally. Unable to sit still. Pt admits to snorting heroin, last use prior to hospital admission 12/15. Notification sent to Dr. Thedore Mins. Emelda Brothers RN

## 2018-05-29 NOTE — Progress Notes (Signed)
Notified Dr. Thedore Mins of increasing sob, CT chest added. Notified of pt's ears blue and knees mottled. Dr. Thedore Mins will see pt on arrival to floor from CT. Cont to monitor. Emelda Brothers RN

## 2018-05-29 NOTE — Plan of Care (Deleted)
  Problem: General Medical Path Diagnosis Goal: Knowledge of Plan of Care will increase 05/29/2018 0115 by Vanetta Shawl, RN Outcome: Progressing 05/29/2018 0113 by Vanetta Shawl, RN Outcome: Progressing   Problem: Acute Pain Goal: Knowledge of Plan of Care will increase 05/29/2018 0115 by Vanetta Shawl, RN Outcome: Progressing 05/29/2018 0113 by Vanetta Shawl, RN Outcome: Progressing   Problem: Cardiovascular: Goal: Knowledge of Plan of Care will increase 05/29/2018 0115 by Vanetta Shawl, RN Outcome: Progressing 05/29/2018 0113 by Vanetta Shawl, RN Outcome: Progressing

## 2018-05-29 NOTE — Progress Notes (Signed)
ANTICOAGULATION CONSULT NOTE - Initial Pharmacy Consult for Heparin Indication: atrial fibrillation, new onset  No Known Allergies  Patient Measurements: Height: 6\' 2"  (188 cm) Weight: 189 lb 4.8 oz (85.9 kg) IBW/kg (Calculated) : 82.2  Heparin dosing weight = actual weight 85.9 kg  Vital Signs: Temp: 97.5 F (36.4 C) (12/16 1216) Temp Source: Oral (12/16 1005) BP: 95/53 (12/16 1416) Pulse Rate: 72 (12/16 1416)  Labs: Recent Labs    05/28/18 1731 05/29/18 0159  HGB 14.0 13.1  HCT 46.7 40.6  PLT 344 321  HEPARINUNFRC  --  0.12*  CREATININE 1.09  --     Estimated Creatinine Clearance: 103.7 mL/min (by C-G formula based on SCr of 1.09 mg/dL).   Medical History: Past Medical History:  Diagnosis Date  . Frequent headaches   . Narcotic abuse (HCC)   . PTSD (post-traumatic stress disorder)    Assessment: 42 year old male admitted on 05/28/18 with SOB and weight gain.  Found to have new onset Afib and was started on IV heparin on 05/28/18.  Heparin infusion was discontinued at 10:52 AM today per Dr. Thedore Mins. However, pharmacy now consulted to restart IV heparin for Afib in this patient with EF of 20% per ECHO 05/29/18.  This AM the heparin level was 0.12, subtherapeutic on heparin 1200 units/hr.  No bleeding noted.  At that time, the heparin rate was increased to 1500 units/hr.  We did not get a heparin level on the new rate before heparin infusion was discontinued by MD.  I will restart heparin at the previous rate 1500 units/hr and give 4000 units initial bolus to start anticoagulation therapy.    Goal of Therapy:  Heparin level 0.3-0.7 units/ml Monitor platelets by anticoagulation protocol: Yes   Plan:  Give Heparin 4000 units iv bolus x 1 Start  IV Heparin 1500 units / hr Heparin level 6 hours after heparin starts Daily heparin level, CBC  Thank you Noah Delaine, RPh Clinical Pharmacist Please check AMION for all [facility] phone numbers After 10:00 PM, call  Main Pharmacy [phone] 05/29/2018,2:41 PM

## 2018-05-29 NOTE — Progress Notes (Signed)
   05/29/18 1600  Clinical Encounter Type  Visited With Patient and family together  Visit Type Follow-up;Psychological support;Spiritual support;Social support  Referral From Physician  Spiritual Encounters  Spiritual Needs Emotional  Stress Factors  Patient Stress Factors Major life changes;Health changes;Loss of control;Exhausted  Family Stress Factors Major life changes;Loss of control   Returned to room and met w/ pt and his wife.  Pt "doesn't want to die yet."  He was still in some denial per some of his convo w/ his wife that he thought this (heart problem/episode) was caused by moving wood.  Wife was kind and direct and repeated that the "dr said it was b/c of the drugs."  Pt said he was going to stop and I asked him what would be different this time and if he was aware what had triggered him to return to drugs in the past ("stress").  Wife said pastor or someone else from their church would visit tonight or tomorrow.  Let her know that chaplains are available 24/7 for support, including to talk to her.  They have 4 dogs and a cat in addition to their 2 offspring.  Have been together 23 years, married 20 years.  Difficult to fully assess spiritual needs at this point.  However, connecting w/ drug cessation resources and figuring out how to better deal with stress are two clear needs.  Mayra Reel Chaplain resident, [place]

## 2018-05-29 NOTE — Progress Notes (Addendum)
PROGRESS NOTE                                                                                                                                                                                                             Patient Demographics:    Burdell Peed, is a 42 y.o. male, DOB - Feb 03, 1976, ZOX:[id number]  Admit date - 05/28/2018   Admitting Physician Hillary Bow, DO  Outpatient Primary MD for the patient is Patient, No Pcp Per  LOS - 0  Chief Complaint  Patient presents with  . Shortness of Breath       Brief Narrative  Gerold Sar is a 42 y.o. male with medical history significant of substance abuse including amphetamine.  Patient presents to the ED with c/o SOB.  Ongoing for the past 1 week.  Worse with exertion, better at rest.  Substernal discomfort on extreme exertion.  Has had to sleep sitting up for past 3-4 days.  Some leg swelling.  No current CP, in the ER he was found to have newly diagnosed A. fib and he was in RVR, chest x-ray consistent with pulmonary edema.   Subjective:    Willem Klingensmith today has, No headache, No chest pain, No abdominal pain - No Nausea, No new weakness tingling or numbness, No Cough - SOB.    Assessment  & Plan :     1.  New onset A. fib RVR causing acute on chronic diastolic CHF.  This likely is methamphetamine use related along with possible early alcohol withdrawal.  His [place] vas 2 score is 0-1.  His blood pressure is borderline hence we will stop his Cardizem drip and place him on amiodarone drip instead, 2 doses of IV digoxin and low-dose oral beta-blocker as tolerated by blood pressure.  Goal will be to keep his heart rate around 100.  TSH is stable echo is pending.  Counseled to quit all recreational drug use and alcohol use.  Will place him on aspirin.  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  2.  Acute on chronic diastolic CHF.  Echo pending.  Likely due to RVR, received 40 of IV Lasix with much improvement.  3.  Alcohol abuse, amphetamine abuse, marijuana abuse, benzodiazepine abuse, narcotic abuse, smoking.  Counseled to quit all.  In mild withdrawal.  Placed on Librium and CIWA protocol.  Extensively counseled to quit everything.   Addendum.  Patient's echocardiogram is back his EF is 20%.  1.  He now has acute on chronic systolic CHF EF 20% with wall motion abnormality on echocardiogram.  This could be ischemic, rate related cardiomyopathy or drug use/alcohol related cardiomyopathy.  He is also staying quite hypotensive, continue amiodarone and 2 doses of dig which were already given for rate control, since he is hypotensive we will discontinue beta-blocker, will place a triple-lumen PICC line catheter and consult CHF team.  He may require dobutamine.  His rate is in better control.  2.  Sudden onset of abdominal pain and some shortness of breath.  Shortness of breath likely due to CHF decompensation, abdominal pain could be from narcotic withdrawal he now says he uses heroin as well.  Will check CT angiogram abdomen pelvis and chest for ongoing sudden onset abdominal pain, shortness of breath and hypotension to rule out a dissection or bleed.  Clinically chances are low.     Family Communication  : Wife bedside  Code Status : Full  Disposition Plan  : Telemetry  Consults  : None  Procedures  :   Echoardiogram pending  DVT Prophylaxis  : Heparin    Lab Results  Component Value Date   PLT 321 05/29/2018    Diet :  Diet Order            Diet Heart Room service appropriate? Yes; Fluid consistency: Thin  Diet effective now               Inpatient Medications Scheduled Meds: . amiodarone  150 mg Intravenous Once  . chlordiazePOXIDE  20 mg Oral TID  . digoxin  0.25 mg Intravenous Q6H  . folic acid  1 mg Oral Daily  . [START ON 05/30/2018]  Influenza vac split quadrivalent PF  0.5 mL Intramuscular Tomorrow-1000  . LORazepam  0-4 mg Intravenous Q4H   Followed by  . [START ON 05/31/2018] LORazepam  0-4 mg Intravenous Q12H  . metoprolol tartrate  25 mg Oral BID  . multivitamin with minerals  1 tablet Oral Daily  . [START ON 05/30/2018] pneumococcal 23 valent vaccine  0.5 mL Intramuscular Tomorrow-1000  . thiamine  100 mg Oral Daily   Or  . thiamine  100 mg Intravenous Daily   Continuous Infusions: . sodium chloride    . amiodarone     Followed by  . amiodarone    . heparin 1,500 Units/hr (05/29/18 0437)   PRN Meds:.acetaminophen, ipratropium-albuterol, LORazepam **OR** LORazepam, nitroGLYCERIN, ondansetron (ZOFRAN) IV, perflutren lipid microspheres (DEFINITY) IV suspension  Antibiotics  :   Anti-infectives (From admission, onward)   None          Objective:   Vitals:   05/28/18 2055 05/28/18 2133 05/29/18 0556 05/29/18 1005  BP: (!) 110/95 114/83 111/83 (!) 108/92  Pulse: 66 (!) 102 89 93  Resp:   20   Temp: 97.6 F (36.4 C)  97.7 F (36.5 C)   TempSrc: Oral  Oral Oral  SpO2: 92% 99% 98% 93%  Weight: 87.3 kg  85.9 kg   Height: 6\' 2"  (1.88 m)       Wt Readings from Last 3 Encounters:  05/29/18 85.9 kg  02/16/18 81.6 kg  12/22/15 81.6 kg     Intake/Output Summary (Last 24 hours) at 05/29/2018 1022 Last data filed at 05/29/2018 0900 Gross per 24 hour  Intake 437.86 ml  Output 475 ml  Net -37.14 ml  Physical Exam  Awake Alert, Oriented X 3, No new F.N deficits, Normal affect [place].AT,PERRAL Supple Neck,No JVD, No cervical lymphadenopathy appriciated.  Symmetrical Chest wall movement, Good air movement bilaterally, few rales iRRR,No Gallops,Rubs or new Murmurs, No Parasternal Heave +ve B.Sounds, Abd Soft, No tenderness, No organomegaly appriciated, No rebound - guarding or rigidity. No Cyanosis, Clubbing or edema, No new Rash or bruise       Data Review:    CBC Recent Labs  Lab  05/28/18 1731 05/29/18 0159  WBC 8.3 9.4  HGB 14.0 13.1  HCT 46.7 40.6  PLT 344 321  MCV 95.5 91.0  MCH 28.6 29.4  MCHC 30.0 32.3  RDW 14.3 14.1  LYMPHSABS 3.3  --   MONOABS 0.6  --   EOSABS 0.2  --   BASOSABS 0.1  --     Chemistries  Recent Labs  Lab 05/28/18 1731  NA 136  K 4.4  CL 102  CO2 27  GLUCOSE 119*  BUN 13  CREATININE 1.09  CALCIUM 8.6*  MG 1.9   ------------------------------------------------------------------------------------------------------------------ No results for input(s): CHOL, HDL, LDLCALC, TRIG, CHOLHDL, LDLDIRECT in the last 72 hours.  No results found for: HGBA1C ------------------------------------------------------------------------------------------------------------------ Recent Labs    05/28/18 1755  TSH 3.162   ------------------------------------------------------------------------------------------------------------------ No results for input(s): VITAMINB12, FOLATE, FERRITIN, TIBC, IRON, RETICCTPCT in the last 72 hours.  Coagulation profile No results for input(s): INR, PROTIME in the last 168 hours.  No results for input(s): DDIMER in the last 72 hours.  Cardiac Enzymes No results for input(s): CKMB, TROPONINI, MYOGLOBIN in the last 168 hours.  Invalid input(s): CK ------------------------------------------------------------------------------------------------------------------    Component Value Date/Time   BNP 696.2 (H) 05/29/2018 0159    Micro Results No results found for this or any previous visit (from the past 240 hour(s)).  Radiology Reports Dg Chest Port 1 View  Result Date: 05/29/2018 CLINICAL DATA:  Shortness of Breath EXAM: PORTABLE CHEST 1 VIEW COMPARISON:  May 28, 2018 FINDINGS: There is cardiomegaly with pulmonary venous hypertension. There are small pleural effusions bilaterally with mild interstitial edema in the bases. There is atelectatic change in the lung bases as well. There is no frank  consolidation. No adenopathy. No bone lesions. IMPRESSION: Cardiomegaly with pulmonary vascular congestion. Small pleural effusions with bibasilar edema. There may be a degree of congestive heart failure. No frank consolidation, although there is patchy atelectatic change in the bases. Electronically Signed   By: Bretta Bang III M.D.   On: 05/29/2018 07:03   Dg Chest Portable 1 View  Result Date: 05/28/2018 CLINICAL DATA:  Shortness of breath EXAM: PORTABLE CHEST 1 VIEW COMPARISON:  04/08/2009 FINDINGS: Cardiomegaly with vascular congestion and interstitial prominence, likely interstitial edema. No effusions or acute bony abnormality. IMPRESSION: Mild-to-moderate interstitial edema/CHF. Electronically Signed   By: Charlett Nose M.D.   On: 05/28/2018 18:31    Time Spent in minutes  30   Susa Raring M.D on 05/29/2018 at 10:22 AM  To page go to [email or web address] - password [facility]

## 2018-05-29 NOTE — Progress Notes (Signed)
ANTICOAGULATION CONSULT NOTE - Follow Up Consult  Pharmacy Consult for heparin Indication: atrial fibrillation  Labs: Recent Labs    05/28/18 1731 05/29/18 0159  HGB 14.0 13.1  HCT 46.7 40.6  PLT 344 321  HEPARINUNFRC  --  0.12*  CREATININE 1.09  --     Assessment: 42yo male subtherapeutic on heparin with initial dosing for Afib; no gtt issues or signs of bleeding per RN.  Goal of Therapy:  Heparin level 0.3-0.7 units/ml   Plan:  Will rebolus with heparin 3000 units and increase heparin gtt by 3-4 units/kg/hr to 1500 units/hr and check level in 6 hours.    Vernard Gambles, PharmD, BCPS  05/29/2018,4:18 AM

## 2018-05-29 NOTE — Progress Notes (Signed)
  Echocardiogram 2D Echocardiogram with definity has been performed.  Leta Jungling M 05/29/2018, 8:47 AM

## 2018-05-29 NOTE — Plan of Care (Signed)
  Problem: General Medical Path Diagnosis Goal: Knowledge of Plan of Care will increase Outcome: Progressing   Problem: Acute Pain Goal: Knowledge of Plan of Care will increase Outcome: Progressing   Problem: Cardiovascular: Goal: Knowledge of Plan of Care will increase Outcome: Progressing

## 2018-05-29 NOTE — Progress Notes (Signed)
Pt c/o feeling funny and freaking out. BP 79/68. Will notify Dr. Thedore Mins. Cont to monitor. Emelda Brothers RN

## 2018-05-29 NOTE — Plan of Care (Deleted)
  Problem: General Medical Path Diagnosis Goal: Knowledge of Plan of Care will increase 05/29/2018 0130 by Vanetta Shawl, RN Outcome: Progressing 05/29/2018 0115 by Vanetta Shawl, RN Outcome: Progressing 05/29/2018 0113 by Vanetta Shawl, RN Outcome: Progressing   Problem: Acute Pain Goal: Knowledge of Plan of Care will increase 05/29/2018 0130 by Vanetta Shawl, RN Outcome: Progressing 05/29/2018 0115 by Vanetta Shawl, RN Outcome: Progressing 05/29/2018 0113 by Vanetta Shawl, RN Outcome: Progressing   Problem: Cardiovascular: Goal: Knowledge of Plan of Care will increase 05/29/2018 0130 by Vanetta Shawl, RN Outcome: Progressing 05/29/2018 0115 by Vanetta Shawl, RN Outcome: Progressing 05/29/2018 0113 by Vanetta Shawl, RN Outcome: Progressing

## 2018-05-29 NOTE — Progress Notes (Signed)
Patient c/o cp radiating to lt back, 0/10 pain scale states 3, states he is sob and on assessment he is labored with lung bases diminished. Cardizem infusing at 5/ Heparin infusing at 12, o2 at 2l [place]. BP=114/83,HR=102, Sats=99%. Notified charge nurse and chest pain protocol initiated. Cardizem increased to 10, o2 increased to 4l [place]. BP= 81/71, HR= 127, returned cardizem to 5. MD on call notified. As ordered 20mg  lasix given and duo neb administered. The patient is more relaxed no respiratory distress. Lungs cta with lll continues to be diminished. BP= 113/94.HR 123, resting without complaints.

## 2018-05-29 NOTE — Progress Notes (Signed)
PICC RN informed patient RN that due to unsuccessful attempts at placing PICC. Recommended CVC line placed or patient be referred to IR for placement. Patient currently with one peripheral IV. Patient RN verbalized understanding and would notify patient MD

## 2018-05-29 NOTE — Progress Notes (Signed)
   05/29/18 1500  Clinical Encounter Type  Visited With Patient not available;Health care provider  Visit Type Initial  Referral From Physician  Consult/Referral To Chaplain   Rcvd update from RN before attempted visit.  When went to rm to visit pt, pt asked chaplain to return later when RN was not working with him.  Will leave consult in epic for next chaplain to see.  Margretta Sidle resident, [phone]

## 2018-05-29 NOTE — Consult Note (Addendum)
Advanced Heart Failure Team Consult Note   Primary Physician: Patient, No Pcp Per PCP-Cardiologist:  No primary care provider on file.  Reason for Consultation: Acute systolic CHF  HPI:    Kenneth [name] is seen today for evaluation of Acute systolic CHF at the request of Dr. Thedore Mins.   Boniface Goffe is a 42 y.o. male with h/o substance abuse including amphetamines.   Pt presented to [facility] 05/28/18 with worsening dyspnea on exertion x 1 week. He reported substernal discomfort on heavy exertion and has had to sleep sitting up for past 3-4 days. Pertinent labs on admission include K 4.4, Cr 1.09, Mg 1.9, WBC 8.3, Hgb 14.0. Troponin negative. BNP 931. CXR with pulmonary vascular congestion, small pleural effusions with basilar edema. Pt also c/o nausea and diarrhea + abdominal tightness. Abdominal imaging unremarkable.  Noted to be in AFib with RVR (New finding) and Echo as below with new systolic CHF. CHF team consulted with above findings plus hypotension.   UDS positive for opiates, benzos, Amphetamines, and THC.   He is feeling OK currently. Very anxious about his condition and worried that he has been given a "death sentence". He understands that he has likely done irreversible damage to his heart with substance abuse, and at the same time, made himself not a candidate for heart transplant currently. He states he began to feel worse approx 1 week prior to admit. He works clearing trees, and noticed SOB and chest heaviness when lifting heavy loads of wood. He has had intermittent tachy-palpitations throughout the years, nothing recent.  He became gradually more dyspneic and orthopnea, and his wife encouraged him to come to ED. He last used drugs on 05/28/18, prior to coming to the hospital. Snorted heroin, he denies injecting. He has a long history of substance abuse, but has never been told he has any heart issues, apart from being told "years ago" he may have a "leaky valve" and had and  "enlarged heart." He drinks 2-3 beers a night [name] night. He lives at home with his wife and 47 yo daughter. Has a 32 yo son who is in the [employer]. 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 He is SOB with any exertion. Mild lightheadedness. He has gained at least 8 lbs in the past week. His mother also has a history of "heart problems" and Afib. He has pending legal proceedings over a business disagreement.   Echo 05/29/18 LVEF 20%, Trivial AI, Mod MR, Mod LAE, mild RAE, Mod TR, PA peak pressure 33 mm Hg.  Abd Portable 05/29/18 -> Unremarkable CT chest/Abd/Pelvis 05/29/18 - Pulmonary edema with R>L pleural effusion - New cardiomegaly - Periportal edema - Circumferential gallbladder wall thickening -> CHF - Mediastinal adenopathy (likely reactive) - Calcified mediastinal lymph nodes, most likely 2/2 to prior granulomatous disease.   Review of Systems: [y] = yes, [ ]  = no   General: Weight gain [y]; Weight loss [ ] ; Anorexia [ ] ; Fatigue [y]; Fever [ ] ; Chills [ ] ; Weakness [ ]   Cardiac: Chest pain/pressure [y]; Resting SOB [y]; Exertional SOB [y]; Orthopnea [y]; Pedal Edema [ ] ; Palpitations [ ] ; Syncope [ ] ; Presyncope [ ] ; Paroxysmal nocturnal dyspnea[ ]   Pulmonary: Cough [ ] ; Wheezing[ ] ; Hemoptysis[ ] ; Sputum [ ] ; Snoring [ ]   GI: Vomiting[ ] ; Dysphagia[ ] ; Melena[ ] ; Hematochezia [ ] ; Heartburn[ ] ; Abdominal pain [y]; Constipation [ ] ; Diarrhea [ ] ; BRBPR [ ]   GU: Hematuria[ ] ; Dysuria [ ] ; Nocturia[ ]   Vascular:  Pain in legs with walking [ ] ; Pain in feet with lying flat [ ] ; Non-healing sores [ ] ; Stroke [ ] ; TIA [ ] ; Slurred speech [ ] ;  Neuro: Headaches[ ] ; Vertigo[ ] ; Seizures[ ] ; Paresthesias[ ] ;Blurred vision [ ] ; Diplopia [ ] ; Vision changes [ ]   Ortho/Skin: Arthritis [ ] ; Joint pain [ ] ; Muscle pain [ ] ; Joint swelling [ ] ; Back Pain [ ] ; Rash [ ]   Psych: Depression[ ] ; Anxiety[ ]   Heme: Bleeding problems [ ] ; Clotting disorders [ ] ; Anemia [ ]   Endocrine: Diabetes [ ] ; Thyroid  dysfunction[ ]   Home Medications Prior to Admission medications   Medication Sig Start Date End Date Taking? Authorizing Provider  ALPRAZolam Prudy Feeler) 1 MG tablet Take 1 mg by mouth daily as needed for anxiety.   Yes [provider]   Past Medical History: Past Medical History:  Diagnosis Date  . Frequent headaches   . Narcotic abuse (HCC)   . PTSD (post-traumatic stress disorder)    Past Surgical History: Past Surgical History:  Procedure Laterality Date  . TONSILLECTOMY     Family History: Family History  Problem Relation Age of Onset  . Miscarriages / [place] Brother   . Schizophrenia Brother    Social History: Social History   Socioeconomic History  . Marital status: Married    Spouse name: Not on file  . Number of children: Not on file  . Years of education: Not on file  . Highest education level: Not on file  Occupational History  . Not on file  Social Needs  . Financial resource strain: Not on file  . Food insecurity:    Worry: Not on file    Inability: Not on file  . Transportation needs:    Medical: Not on file    Non-medical: Not on file  Tobacco Use  . Smoking status: Current Some Day Smoker    Packs/day: 1.00    Years: 0.50    Pack years: 0.50    Types: Cigarettes  . Smokeless tobacco: Current User    Types: Snuff  Substance and Sexual Activity  . Alcohol use: No    Alcohol/week: 0.0 standard drinks  . Drug use: No    Types: Cocaine, Marijuana  . Sexual activity: Yes    Birth control/protection: None  Lifestyle  . Physical activity:    Days per week: Not on file    Minutes per session: Not on file  . Stress: Not on file  Relationships  . Social connections:    Talks on phone: Not on file    Gets together: Not on file    Attends religious service: Not on file    Active member of club or organization: Not on file    Attends meetings of clubs or organizations: Not on file    Relationship status: Not on file  Other Topics  Concern  . Not on file  Social History Narrative  . Not on file   Allergies:  No Known Allergies  Objective:    Vital Signs:   Temp:  [97.4 F (36.3 C)-97.7 F (36.5 C)] 97.5 F (36.4 C) (12/16 1216) Pulse Rate:  [49-156] 72 (12/16 1416) Resp:  [13-22] 16 (12/16 1416) BP: (79-135)/(53-108) 95/53 (12/16 1416) SpO2:  [90 %-100 %] 100 % (12/16 1417) Weight:  [85.9 kg-87.3 kg] 85.9 kg (12/16 0556) Last BM Date: 05/28/18  Weight change: Filed Weights   05/28/18 1723 05/28/18 2055 05/29/18 0556  Weight: 86.2 kg 87.3 kg 85.9 kg  Intake/Output:   Intake/Output Summary (Last 24 hours) at 05/29/2018 1449 Last data filed at 05/29/2018 0900 Gross per 24 hour  Intake 437.86 ml  Output 475 ml  Net -37.14 ml    Physical Exam    General:  Fatigued appearing. NAD.  HEENT: Normal Neck: supple. JVP to jaw. Carotids 2+ bilat; no bruits. No lymphadenopathy or thyromegaly appreciated. Cor: PMI lateral. Irregularly irregular. ?S3 Lungs: Diminished basilar sounds with slight crackles. Abdomen: Soft, nontender, nondistended. No hepatosplenomegaly. No bruits or masses. Good bowel sounds. Extremities: No clubbing, rash, or edema. Cool but not cold. Knees mottled. ? Fingers and toes mottling. Neuro: Alert & orientedx3, cranial nerves grossly intact. moves all 4 extremities w/o difficulty. Affect pleasant  Telemetry   Afib 90-100s, personally reviewed  EKG    Afib RVR at 124 bpm, personally reviewed.  Labs   Basic Metabolic Panel: Recent Labs  Lab 05/28/18 1731  NA 136  K 4.4  CL 102  CO2 27  GLUCOSE 119*  BUN 13  CREATININE 1.09  CALCIUM 8.6*  MG 1.9    Liver Function Tests: No results for input(s): AST, ALT, ALKPHOS, BILITOT, PROT, ALBUMIN in the last 168 hours. No results for input(s): LIPASE, AMYLASE in the last 168 hours. No results for input(s): AMMONIA in the last 168 hours.  CBC: Recent Labs  Lab 05/28/18 1731 05/29/18 0159  WBC 8.3 9.4  NEUTROABS 4.1   --   HGB 14.0 13.1  HCT 46.7 40.6  MCV 95.5 91.0  PLT 344 321    Cardiac Enzymes: No results for input(s): CKTOTAL, CKMB, CKMBINDEX, TROPONINI in the last 168 hours.  BNP: BNP (last 3 results) Recent Labs    05/28/18 1731 05/29/18 0159  BNP 931.2* 696.2*    ProBNP (last 3 results) No results for input(s): PROBNP in the last 8760 hours.   CBG: No results for input(s): GLUCAP in the last 168 hours.  Coagulation Studies: No results for input(s): LABPROT, INR in the last 72 hours.   Imaging   Ct Chest W Contrast  Result Date: 05/29/2018 CLINICAL DATA:  42 year old male with a history of shortness of breath and abdominal pain EXAM: CT CHEST, ABDOMEN, AND PELVIS WITH CONTRAST TECHNIQUE: Multidetector CT imaging of the chest, abdomen and pelvis was performed following the standard protocol during bolus administration of intravenous contrast. CONTRAST:  75mL OMNIPAQUE IOHEXOL 300 MG/ML SOLN; OMNIPAQUE IOHEXOL 300 MG/ML SOLN COMPARISON:  None. FINDINGS: CT CHEST FINDINGS Cardiovascular: Global cardiomegaly. This finding was present on comparison chest x-ray of this year, and is new from the CT of 06/26/2012 and plain film of 04/08/2009. No filling defects identified within the main pulmonary artery, lobar pulmonary arteries, or proximal pulmonary arteries. Unremarkable course caliber and contour of the thoracic aorta. Mediastinum/Nodes: Unremarkable thoracic inlet. Multiple borderline enlarged mediastinal lymph nodes. Lymph nodes of the right hilum are partially calcified. Lungs/Pleura: Diffuse interlobular septal thickening extending to the periphery with thickening of the fissures and right greater than left pleural effusion. Mild bronchial wall thickening. No endobronchial debris. Early regions of ground-glass opacity throughout the lungs. Atelectasis of the bilateral lower lobes. Musculoskeletal: No acute displaced fracture. CT ABDOMEN PELVIS FINDINGS Hepatobiliary: No focal  lesion of the liver. Periportal edema. Refluxing contrast into the hepatic veins from the right heart. No hyperdense material within the gallbladder lumen, however, there is gallbladder wall thickening/pericholecystic fluid. No extrahepatic biliary ductal dilatation or intrahepatic ductal dilatation. Pancreas: Unremarkable pancreas Spleen: Unremarkable spleen Adrenals/Urinary Tract: Unremarkable appearance of the adrenal  glands. No evidence of hydronephrosis of the right or left kidney. No nephrolithiasis. Unremarkable course of the bilateral ureters. Unremarkable appearance of the urinary bladder. Stomach/Bowel: Stomach is within normal limits. Appendix appears normal. No evidence of bowel wall thickening, distention, or inflammatory changes. Vascular/Lymphatic: No significant vascular findings are present. No enlarged abdominal or pelvic lymph nodes. Reproductive: Unremarkable pelvic structures Other: Low-density free fluid layer dependently within the pelvis. Mild mesenteric edema, particularly of the upper abdomen. Musculoskeletal: No significant degenerative changes. No acute fracture. IMPRESSION: Pulmonary edema with right greater than left pleural effusions. Cardiomegaly, which is new from the comparison CT of 06/26/2012. Periportal edema, likely a manifestation of acute congestive heart failure. There is circumferential gallbladder wall thickening/fluid without radiopaque stone burden. This finding can be seen in the setting of congestive heart failure, which is the most likely reason for the CT changes. If there is concern for acute biliary symptoms/acute cholecystitis, correlation with nuclear medicine HIDA study may be considered. Mediastinal adenopathy, most likely reactive in the setting of edema, however, lymphoproliferative disorder can not be excluded. Calcified mediastinal lymph nodes, most likely secondary to prior granulomatous disease. Electronically Signed   By: Gilmer Mor D.O.   On:  05/29/2018 14:30   Ct Abdomen Pelvis W Contrast  Result Date: 05/29/2018 CLINICAL DATA:  42 year old male with a history of shortness of breath and abdominal pain EXAM: CT CHEST, ABDOMEN, AND PELVIS WITH CONTRAST TECHNIQUE: Multidetector CT imaging of the chest, abdomen and pelvis was performed following the standard protocol during bolus administration of intravenous contrast. CONTRAST:  75mL OMNIPAQUE IOHEXOL 300 MG/ML SOLN; OMNIPAQUE IOHEXOL 300 MG/ML SOLN COMPARISON:  None. FINDINGS: CT CHEST FINDINGS Cardiovascular: Global cardiomegaly. This finding was present on comparison chest x-ray of this year, and is new from the CT of 06/26/2012 and plain film of 04/08/2009. No filling defects identified within the main pulmonary artery, lobar pulmonary arteries, or proximal pulmonary arteries. Unremarkable course caliber and contour of the thoracic aorta. Mediastinum/Nodes: Unremarkable thoracic inlet. Multiple borderline enlarged mediastinal lymph nodes. Lymph nodes of the right hilum are partially calcified. Lungs/Pleura: Diffuse interlobular septal thickening extending to the periphery with thickening of the fissures and right greater than left pleural effusion. Mild bronchial wall thickening. No endobronchial debris. Early regions of ground-glass opacity throughout the lungs. Atelectasis of the bilateral lower lobes. Musculoskeletal: No acute displaced fracture. CT ABDOMEN PELVIS FINDINGS Hepatobiliary: No focal lesion of the liver. Periportal edema. Refluxing contrast into the hepatic veins from the right heart. No hyperdense material within the gallbladder lumen, however, there is gallbladder wall thickening/pericholecystic fluid. No extrahepatic biliary ductal dilatation or intrahepatic ductal dilatation. Pancreas: Unremarkable pancreas Spleen: Unremarkable spleen Adrenals/Urinary Tract: Unremarkable appearance of the adrenal glands. No evidence of hydronephrosis of the right or left kidney. No  nephrolithiasis. Unremarkable course of the bilateral ureters. Unremarkable appearance of the urinary bladder. Stomach/Bowel: Stomach is within normal limits. Appendix appears normal. No evidence of bowel wall thickening, distention, or inflammatory changes. Vascular/Lymphatic: No significant vascular findings are present. No enlarged abdominal or pelvic lymph nodes. Reproductive: Unremarkable pelvic structures Other: Low-density free fluid layer dependently within the pelvis. Mild mesenteric edema, particularly of the upper abdomen. Musculoskeletal: No significant degenerative changes. No acute fracture. IMPRESSION: Pulmonary edema with right greater than left pleural effusions. Cardiomegaly, which is new from the comparison CT of 06/26/2012. Periportal edema, likely a manifestation of acute congestive heart failure. There is circumferential gallbladder wall thickening/fluid without radiopaque stone burden. This finding can be seen in the setting  of congestive heart failure, which is the most likely reason for the CT changes. If there is concern for acute biliary symptoms/acute cholecystitis, correlation with nuclear medicine HIDA study may be considered. Mediastinal adenopathy, most likely reactive in the setting of edema, however, lymphoproliferative disorder can not be excluded. Calcified mediastinal lymph nodes, most likely secondary to prior granulomatous disease. Electronically Signed   By: Gilmer Mor D.O.   On: 05/29/2018 14:30   Dg Chest Port 1 View  Result Date: 05/29/2018 CLINICAL DATA:  Shortness of Breath EXAM: PORTABLE CHEST 1 VIEW COMPARISON:  May 28, 2018 FINDINGS: There is cardiomegaly with pulmonary venous hypertension. There are small pleural effusions bilaterally with mild interstitial edema in the bases. There is atelectatic change in the lung bases as well. There is no frank consolidation. No adenopathy. No bone lesions. IMPRESSION: Cardiomegaly with pulmonary vascular congestion.  Small pleural effusions with bibasilar edema. There may be a degree of congestive heart failure. No frank consolidation, although there is patchy atelectatic change in the bases. Electronically Signed   By: Bretta Bang III M.D.   On: 05/29/2018 07:03   Dg Chest Portable 1 View  Result Date: 05/28/2018 CLINICAL DATA:  Shortness of breath EXAM: PORTABLE CHEST 1 VIEW COMPARISON:  04/08/2009 FINDINGS: Cardiomegaly with vascular congestion and interstitial prominence, likely interstitial edema. No effusions or acute bony abnormality. IMPRESSION: Mild-to-moderate interstitial edema/CHF. Electronically Signed   By: Charlett Nose M.D.   On: 05/28/2018 18:31   Dg Abd Portable 1v  Result Date: 05/29/2018 CLINICAL DATA:  Epigastric pain. Nausea and diarrhea. Abdominal tightness. EXAM: PORTABLE ABDOMEN - 1 VIEW COMPARISON:  None. FINDINGS: No renal or ureteral stones identified. No evidence of bowel obstruction. No acute abnormalities. IMPRESSION: Negative. Electronically Signed   By: Gerome Sam III M.D   On: 05/29/2018 11:40   [place] Ekg Site Rite  Result Date: 05/29/2018 If Site Rite image not attached, placement could not be confirmed due to current cardiac rhythm.    Medications:     Current Medications: . aspirin  81 mg Oral Daily  . [START ON 05/30/2018] buprenorphine-naloxone  1 tablet Sublingual BID  . chlordiazePOXIDE  5 mg Oral TID  . digoxin  0.25 mg Intravenous Q6H  . folic acid  1 mg Oral Daily  . heparin injection (subcutaneous)  5,000 Units Subcutaneous Q8H  . [START ON 05/30/2018] Influenza vac split quadrivalent PF  0.5 mL Intramuscular Tomorrow-1000  . LORazepam  0-4 mg Intravenous Q4H   Followed by  . [START ON 05/31/2018] LORazepam  0-4 mg Intravenous Q12H  . multivitamin with minerals  1 tablet Oral Daily  . [START ON 05/30/2018] pneumococcal 23 valent vaccine  0.5 mL Intramuscular Tomorrow-1000  . thiamine  100 mg Oral Daily   Or  . thiamine  100 mg Intravenous  Daily    Infusions: . sodium chloride    . amiodarone 60 mg/hr (05/29/18 1053)   Followed by  . amiodarone        Patient Profile   Laquinton Bihm is a 42 y.o. male with h/o substance abuse including amphetamines.   He presented to [facility] 05/28/18 with new acute systolic CHF in the setting of Afib RVR and substance abuse.   Assessment/Plan   1. Acute systolic CHF - Likely in setting of chronic substance abuse and Afib RVR.  - Suspect he is in low output CHF in setting of substance abuse and Afib RVR - Volume status overloaded on exam.  - Will discuss diuresis  with MD. If does not tolerate lasix, will need to move to ICU for pressor support.  - Add digoxin 0.125 mg daily - Consider spiro 12.5 mg qhs (follow pressures today).  - Hold off on ACE/ARB for now while optimizing fluid status.  - Avoid BB for now with low output.  - Place triple lumen PICC and obtain coox/CVP. If BP continues to drop, move to ICU for pressor support - Pt is not a candidate for advanced therapies including transplant, LVAD, OR home inotrope support due to on-going substance abuse.   2. Afib with RVR - Continue IV amiodarone (No more diltiazem with depressed EF). - CHA2DS2-VASc is at least 2 (CHF, HTN). Unknown vascular disease. - Will need TEE/DCCV if he does not convert.  - Will need to decide on [facility]. Ideally Eliquis but may need to consider Xarelto (once daily) due to ? Compliance.   3. Substance abuse - UDS 05/28/18 positive for opiates, BZDs, amphetamines, and THC.   As PICC team unsure they can get too today, will move to ICU and place central line.   Medication concerns reviewed with patient and pharmacy team. Barriers identified: Compliance. Substance abuse.   Length of Stay: 0  Luane School  05/29/2018, 2:49 PM  Advanced Heart Failure Team Pager [phone] (M-F; 7a - 4p)  Please contact [facility] for night-coverage after hours (4p -7a ) and weekends on  [email or web address]  Agree with above.  42 y/o male with active polysubstance abuse admitted with 1 week of progressive HF symptoms and ab pain. Found to be in AF with RVR on admit. Susbesequent echo shows severe biventricular dysfunction with EF 20% (Personally reviewed). Currently hypotensive and mottled.   On exam Uncomfortable appearing JVP to jaw Cor IRR +s3 Lungs basilar crackles Ab soft NT Ext cool mildly mottled,   He is critically ill with probable cardiogenic shock due to biventricular HF. Unclear if AF came first or is a result of his cardiomyopathy. Move to ICU. Place central access to follow CVP and co-ox. Will need IV amio and heparin. Start inotropes once central access obtained. Will eventually ned cath. May get worse before hee gets better. Agree with CIWA protocol.  CRITICAL CARE Performed by: Arvilla Meres  Total critical care time: 45 minutes  Critical care time was exclusive of separately billable procedures and treating other patients.  Critical care was necessary to treat or prevent imminent or life-threatening deterioration.  Critical care was time spent personally by me (independent of midlevel providers or residents) on the following activities: development of treatment plan with patient and/or surrogate as well as nursing, discussions with consultants, evaluation of patient's response to treatment, examination of patient, obtaining history from patient or surrogate, ordering and performing treatments and interventions, ordering and review of laboratory studies, ordering and review of radiographic studies, pulse oximetry and re-evaluation of patient's condition.  Arvilla Meres, MD  7:00 PM

## 2018-05-29 NOTE — Progress Notes (Signed)
PICC RN placed triple lumen PICC in R basillic vein with catheter occupying 12% of vein.  Post PICC placement, chest xray verified Right sided PICC crossed the midline instead of residing in the SVC. PICC exchange attempted using same Basilic vein .  [place] ECG illustrated PICC to still cross the midline versus residing in the SVC. PICC line removed, pressure dressing placed on insertion site.  Patient  RN notified.

## 2018-05-29 NOTE — Progress Notes (Signed)
  Amiodarone Drug - Drug Interaction Consult Note  Recommendations:  Be aware of potential drug interaction with digoxin if digoxin is continued.  Currently digoxin only ordered x 2 doses on 05/29/18.   Amiodarone is metabolized by the cytochrome P450 system and therefore has the potential to cause many drug interactions. Amiodarone has an average plasma half-life of 50 days (range 20 to 100 days).   There is potential for drug interactions to occur several weeks or months after stopping treatment and the onset of drug interactions may be slow after initiating amiodarone.   []  Statins: Increased risk of myopathy. Simvastatin- restrict dose to 20mg  daily. Other statins: counsel patients to report any muscle pain or weakness immediately.  []  Anticoagulants: Amiodarone can increase anticoagulant effect. Consider warfarin dose reduction. Patients should be monitored closely and the dose of anticoagulant altered accordingly, remembering that amiodarone levels take several weeks to stabilize.  []  Antiepileptics: Amiodarone can increase plasma concentration of phenytoin, the dose should be reduced. Note that small changes in phenytoin dose can result in large changes in levels. Monitor patient and counsel on signs of toxicity.  []  Beta blockers: increased risk of bradycardia, AV block and myocardial depression. Sotalol - avoid concomitant use.  []   Calcium channel blockers (diltiazem and verapamil): increased risk of bradycardia, AV block and myocardial depression.  []   Cyclosporine: Amiodarone increases levels of cyclosporine. Reduced dose of cyclosporine is recommended.  [x]  Digoxin dose should be halved when amiodarone is started. ---however  Digoxin order is only for 2 doses of  0.25 mg IV q6hours (x 2) on 05/29/18  []  Diuretics: increased risk of cardiotoxicity if hypokalemia occurs.  []  Oral hypoglycemic agents (glyburide, glipizide, glimepiride): increased risk of hypoglycemia. Patient's  glucose levels should be monitored closely when initiating amiodarone therapy.   []  Drugs that prolong the QT interval:  Torsades de pointes risk may be increased with concurrent use - avoid if possible.  Monitor QTc, also keep magnesium/potassium WNL if concurrent therapy can't be avoided. Marland Kitchen Antibiotics: e.g. fluoroquinolones, erythromycin. . Antiarrhythmics: e.g. quinidine, procainamide, disopyramide, sotalol. . Antipsychotics: e.g. phenothiazines, haloperidol.  . Lithium, tricyclic antidepressants, and methadone.  Thank You,  Noah Delaine, RPh Clinical Pharmacist Please check AMION for all [facility] phone numbers After 10:00 PM, call Main Pharmacy [phone] 05/29/2018 10:37 AM

## 2018-05-29 NOTE — Progress Notes (Signed)
Report called to Cybill RN on 2 Heart. Wife at bedside. Pt aware of transfer. Transferred by SWOT team. Emelda Brothers RN

## 2018-05-29 NOTE — Progress Notes (Signed)
Peripherally Inserted Central Catheter/Midline Placement  The IV Nurse has discussed with the patient and/or persons authorized to consent for the patient, the purpose of this procedure and the potential benefits and risks involved with this procedure.  The benefits include less needle sticks, lab draws from the catheter, and the patient may be discharged home with the catheter. Risks include, but not limited to, infection, bleeding, blood clot (thrombus formation), and puncture of an artery; nerve damage and irregular heartbeat and possibility to perform a PICC exchange if needed/ordered by physician.  Alternatives to this procedure were also discussed.  Bard Power PICC patient education guide, fact sheet on infection prevention and patient information card has been provided to patient /or left at bedside.    PICC/Midline Placement Documentation  PICC Triple Lumen 05/29/18 PICC Right Basilic 39 cm 0 cm (Active)  Indication for Insertion or Continuance of Line Vasoactive infusions 05/29/2018  8:16 PM  Exposed Catheter (cm) 0 cm 05/29/2018  8:16 PM  Site Assessment Clean;Dry;Intact 05/29/2018  8:16 PM  Lumen #1 Status Flushed;Saline locked;Blood return noted 05/29/2018  8:16 PM  Lumen #2 Status Flushed;Saline locked;Blood return noted 05/29/2018  8:16 PM  Lumen #3 Status Flushed;Saline locked;Blood return noted 05/29/2018  8:16 PM  Dressing Type Transparent;Securing device 05/29/2018  8:16 PM  Dressing Status Clean;Dry;Intact;Antimicrobial disc in place 05/29/2018  8:16 PM  Line Care Connections checked and tightened 05/29/2018  8:16 PM  Dressing Intervention New dressing 05/29/2018  8:16 PM  Dressing Change Due 06/05/18 05/29/2018  8:16 PM       Tonna Boehringer 05/29/2018, 8:21 PM

## 2018-05-30 ENCOUNTER — Inpatient Hospital Stay (HOSPITAL_COMMUNITY): Payer: BLUE CROSS/BLUE SHIELD

## 2018-05-30 DIAGNOSIS — I509 Heart failure, unspecified: Secondary | ICD-10-CM

## 2018-05-30 DIAGNOSIS — I4891 Unspecified atrial fibrillation: Secondary | ICD-10-CM

## 2018-05-30 LAB — CBC
HCT: 42.1 % (ref 39.0–52.0)
HEMOGLOBIN: 13.4 g/dL (ref 13.0–17.0)
MCH: 28.5 pg (ref 26.0–34.0)
MCHC: 31.8 g/dL (ref 30.0–36.0)
MCV: 89.4 fL (ref 80.0–100.0)
Platelets: 332 10*3/uL (ref 150–400)
RBC: 4.71 MIL/uL (ref 4.22–5.81)
RDW: 14.2 % (ref 11.5–15.5)
WBC: 10.1 10*3/uL (ref 4.0–10.5)
nRBC: 0.2 % (ref 0.0–0.2)

## 2018-05-30 LAB — COOXEMETRY PANEL
Carboxyhemoglobin: 1 % (ref 0.5–1.5)
Carboxyhemoglobin: 1 % (ref 0.5–1.5)
Carboxyhemoglobin: 1.2 % (ref 0.5–1.5)
METHEMOGLOBIN: 1.7 % — AB (ref 0.0–1.5)
Methemoglobin: 1.7 % — ABNORMAL HIGH (ref 0.0–1.5)
Methemoglobin: 1.6 % — ABNORMAL HIGH (ref 0.0–1.5)
O2 Saturation: 51.9 %
O2 Saturation: 50 %
O2 Saturation: 61.8 %
Total hemoglobin: 14 g/dL (ref 12.0–16.0)
Total hemoglobin: 13.9 g/dL (ref 12.0–16.0)
Total hemoglobin: 13.7 g/dL (ref 12.0–16.0)

## 2018-05-30 LAB — BASIC METABOLIC PANEL
Anion gap: 13 (ref 5–15)
BUN: 17 mg/dL (ref 6–20)
CO2: 28 mmol/L (ref 22–32)
Calcium: 8.5 mg/dL — ABNORMAL LOW (ref 8.9–10.3)
Chloride: 96 mmol/L — ABNORMAL LOW (ref 98–111)
Creatinine, Ser: 1.33 mg/dL — ABNORMAL HIGH (ref 0.61–1.24)
GFR calc Af Amer: >60 mL/min (ref >60)
GFR calc non Af Amer: >60 mL/min (ref >60)
Glucose, Bld: 109 mg/dL — ABNORMAL HIGH (ref 70–99)
Potassium: 5.1 mmol/L (ref 3.5–5.1)
Sodium: 137 mmol/L (ref 135–145)

## 2018-05-30 LAB — PROTIME-INR
INR: 1.17
Prothrombin Time: 14.8 seconds (ref 11.4–15.2)

## 2018-05-30 LAB — HEPARIN LEVEL (UNFRACTIONATED): Heparin Unfractionated: 0.11 IU/mL — ABNORMAL LOW (ref 0.30–0.70)

## 2018-05-30 LAB — LACTIC ACID, PLASMA: Lactic Acid, Venous: 1.1 mmol/L (ref 0.5–1.9)

## 2018-05-30 LAB — MAGNESIUM: MAGNESIUM: 1.8 mg/dL (ref 1.7–2.4)

## 2018-05-30 MED ORDER — DIGOXIN 125 MCG PO TABS
0.1250 mg | ORAL_TABLET | Freq: Every day | ORAL | Status: DC
  Administered 2018-05-30 – 2018-06-01 (×4): 0.125 mg via ORAL
  Filled 2018-05-30 (×4): qty 1

## 2018-05-30 MED ORDER — MUPIROCIN 2 % EX OINT
1.0000 "application " | TOPICAL_OINTMENT | Freq: Two times a day (BID) | CUTANEOUS | Status: AC
  Administered 2018-05-30 – 2018-06-03 (×10): 1 via NASAL
  Filled 2018-05-30 (×4): qty 22

## 2018-05-30 MED ORDER — HEPARIN (PORCINE) 25000 UT/250ML-% IV SOLN
1650.0000 [IU]/h | INTRAVENOUS | Status: DC
  Administered 2018-05-30: 1450 [IU]/h via INTRAVENOUS
  Administered 2018-05-31 – 2018-06-01 (×3): 1650 [IU]/h via INTRAVENOUS
  Filled 2018-05-30 (×4): qty 250

## 2018-05-30 MED ORDER — SPIRONOLACTONE 12.5 MG HALF TABLET
12.5000 mg | ORAL_TABLET | Freq: Every day | ORAL | Status: DC
  Administered 2018-05-30 – 2018-05-31 (×2): 12.5 mg via ORAL
  Filled 2018-05-30 (×2): qty 1

## 2018-05-30 MED ORDER — CHLORHEXIDINE GLUCONATE CLOTH 2 % EX PADS
6.0000 | MEDICATED_PAD | Freq: Every day | CUTANEOUS | Status: DC
  Administered 2018-06-01 – 2018-06-03 (×3): 6 via TOPICAL

## 2018-05-30 MED ORDER — MILRINONE LACTATE IN DEXTROSE 20-5 MG/100ML-% IV SOLN
0.1250 ug/kg/min | INTRAVENOUS | Status: DC
  Administered 2018-05-30 – 2018-06-02 (×4): 0.125 ug/kg/min via INTRAVENOUS
  Filled 2018-05-30 (×4): qty 100

## 2018-05-30 MED ORDER — FUROSEMIDE 40 MG PO TABS
40.0000 mg | ORAL_TABLET | Freq: Every day | ORAL | Status: DC
  Administered 2018-05-30: 40 mg via ORAL
  Filled 2018-05-30: qty 1

## 2018-05-30 NOTE — Progress Notes (Signed)
Patient developed a large bleeding episode from the right upper arm around 2300. This was were the PICC had been removed by the IV/PICC Team nurse. Pressure was held for 10 minutes and a pressure dressing was applied. Dr. Gala Romney ordered the heperin gtt to be turned off at this time. Pharmacy has been made aware and they stated they would address it with the team during rounds this morning.

## 2018-05-30 NOTE — Progress Notes (Signed)
Spoke with Dr. Vonzella Nipple regarding patients carboxyhemoglobin results and patients overall general poor condition. Starting inotropes were discussed and Dr. Vonzella Nipple decided it was not needed at this time with patient having good urine output and skin feeling warm.

## 2018-05-30 NOTE — Progress Notes (Signed)
VAST RN consulted for PIV for heparin infusion. Pt has triple lumen CVC which is infusing Milrinone and Amiodarone. Unit RN wanted to reserve remaining port for lab draws. Pt has PIV in right [facility] which is currently infusing Heparin, but is very positional and sluggish to flush.  VAST RN did not visualize any appropriate veins to stick; notified unit RN. She is going to switch heparin to third lumen of of CVC.

## 2018-05-30 NOTE — Care Management Note (Signed)
Case Management Note  Patient Details  Name: Kenneth [name] MRN: [id number] Date of Birth: 03/16/1976  Subjective/Objective:  42 yo male presented with SOB. PMH: PTSD, Alcohol abuse, amphetamine abuse, marijuana abuse, benzodiazepine abuse, Heroin abuse, smoking             Action/Plan: CM consult acknowledged for assisting with a safe injection/needle exchange programs; CM will enter an additional order for CSW to assist with a referral to [facility] ([facility]) for syringe exchange. CM team will continue to follow.    Expected Discharge Date:                  Expected Discharge Plan:  Home/Self Care  In-House Referral:  Clinical Social Work  Discharge planning Services  CM Consult  Post Acute Care Choice:  NA Choice offered to:  NA  DME Arranged:  N/A DME Agency:  NA  HH Arranged:  NA HH Agency:  NA  Status of Service:  In process, will continue to follow  If discussed at Long Length of Stay Meetings, dates discussed:    Additional Comments:  Colleen Can RN, BSN, NCM-BC, ACM-RN [phone] 05/30/2018, 10:03 AM

## 2018-05-30 NOTE — Procedures (Signed)
Central Venous Catheter Insertion Procedure Note Jonathan Mcquarrie [id number] Jun 12, 1976  Procedure: Insertion of Central Venous Catheter Indications: Assessment of intravascular volume, Drug and/or fluid administration and Frequent blood sampling  Procedure Details Consent: Risks of procedure as well as the alternatives and risks of each were explained to the (patient/caregiver).  Consent for procedure obtained. Time Out: Verified patient identification, verified procedure, site/side was marked, verified correct patient position, special equipment/implants available, medications/allergies/relevent history reviewed, required imaging and test results available.  Performed  Maximum sterile technique was used including antiseptics, cap, gloves, gown, hand hygiene, mask and sheet. Skin prep: Chlorhexidine; local anesthetic administered A antimicrobial bonded/coated triple lumen catheter was placed in the left internal jugular vein using the Seldinger technique.  Evaluation Blood flow good Complications: No apparent complications Patient did tolerate procedure well. Chest X-ray ordered to verify placement.  CXR: pending.  Procedure performed under direct ultrasound guidance for real time vessel cannulation.      Rutherford Guys, [place] [facility] [place] [facility] Pager: [phone]  or [phone] 05/30/2018, 12:18 AM

## 2018-05-30 NOTE — Progress Notes (Signed)
Advanced Heart Failure Rounding Note   Subjective:    PICC placed and had to be removed last night as it was malpositioned. LIJ TLC placed.   Co-ox 50% today. Feels jittery. + SOB but improved. Able to finally get some sleep/   Good diuresis. About 2L out. Last CVP 8. Feels like he is withdrawing from drugs     Objective:   Weight Range:  Vital Signs:   Temp:  [97.5 F (36.4 C)-98.1 F (36.7 C)] 97.9 F (36.6 C) (12/17 0315) Pulse Rate:  [29-188] 91 (12/17 0651) Resp:  [13-31] 27 (12/17 0651) BP: (79-134)/(46-102) 110/87 (12/17 0645) SpO2:  [89 %-100 %] 93 % (12/17 0651) Last BM Date: 05/28/18  Weight change: Filed Weights   05/28/18 1723 05/28/18 2055 05/29/18 0556  Weight: 86.2 kg 87.3 kg 85.9 kg    Intake/Output:   Intake/Output Summary (Last 24 hours) at 05/30/2018 0706 Last data filed at 05/30/2018 0600 Gross per 24 hour  Intake 664.18 ml  Output 2100 ml  Net -1435.82 ml     Physical Exam: General:  Fatigued appearing. No resp difficulty HEENT: normal Neck: supple. JVP 6  LIJ TLC Carotids 2+ bilat; no bruits. No lymphadenopathy or thryomegaly appreciated. Cor: PMI nondisplaced. IRR +s3 Lungs: clear Abdomen: soft, nontender, nondistended. No hepatosplenomegaly. No bruits or masses. Good bowel sounds. Extremities: no cyanosis, clubbing, rash, edema Neuro: alert & orientedx3, cranial nerves grossly intact. moves all 4 extremities w/o difficulty. Affect pleasant  Telemetry: AF 90s. Personally reviewed   Labs: Basic Metabolic Panel: Recent Labs  Lab 05/28/18 1731  NA 136  K 4.4  CL 102  CO2 27  GLUCOSE 119*  BUN 13  CREATININE 1.09  CALCIUM 8.6*  MG 1.9    Liver Function Tests: No results for input(s): AST, ALT, ALKPHOS, BILITOT, PROT, ALBUMIN in the last 168 hours. No results for input(s): LIPASE, AMYLASE in the last 168 hours. No results for input(s): AMMONIA in the last 168 hours.  CBC: Recent Labs  Lab 05/28/18 1731  05/29/18 0159 05/30/18 0500  WBC 8.3 9.4 10.1  NEUTROABS 4.1  --   --   HGB 14.0 13.1 13.4  HCT 46.7 40.6 42.1  MCV 95.5 91.0 89.4  PLT 344 321 332    Cardiac Enzymes: No results for input(s): CKTOTAL, CKMB, CKMBINDEX, TROPONINI in the last 168 hours.  BNP: BNP (last 3 results) Recent Labs    05/28/18 1731 05/29/18 0159  BNP 931.2* 696.2*    ProBNP (last 3 results) No results for input(s): PROBNP in the last 8760 hours.    Other results:  Imaging: Ct Chest W Contrast  Result Date: 05/29/2018 CLINICAL DATA:  42 year old male with a history of shortness of breath and abdominal pain EXAM: CT CHEST, ABDOMEN, AND PELVIS WITH CONTRAST TECHNIQUE: Multidetector CT imaging of the chest, abdomen and pelvis was performed following the standard protocol during bolus administration of intravenous contrast. CONTRAST:  75mL OMNIPAQUE IOHEXOL 300 MG/ML SOLN; OMNIPAQUE IOHEXOL 300 MG/ML SOLN COMPARISON:  None. FINDINGS: CT CHEST FINDINGS Cardiovascular: Global cardiomegaly. This finding was present on comparison chest x-ray of this year, and is new from the CT of 06/26/2012 and plain film of 04/08/2009. No filling defects identified within the main pulmonary artery, lobar pulmonary arteries, or proximal pulmonary arteries. Unremarkable course caliber and contour of the thoracic aorta. Mediastinum/Nodes: Unremarkable thoracic inlet. Multiple borderline enlarged mediastinal lymph nodes. Lymph nodes of the right hilum are partially calcified. Lungs/Pleura: Diffuse interlobular septal thickening extending  to the periphery with thickening of the fissures and right greater than left pleural effusion. Mild bronchial wall thickening. No endobronchial debris. Early regions of ground-glass opacity throughout the lungs. Atelectasis of the bilateral lower lobes. Musculoskeletal: No acute displaced fracture. CT ABDOMEN PELVIS FINDINGS Hepatobiliary: No focal lesion of the liver. Periportal edema. Refluxing  contrast into the hepatic veins from the right heart. No hyperdense material within the gallbladder lumen, however, there is gallbladder wall thickening/pericholecystic fluid. No extrahepatic biliary ductal dilatation or intrahepatic ductal dilatation. Pancreas: Unremarkable pancreas Spleen: Unremarkable spleen Adrenals/Urinary Tract: Unremarkable appearance of the adrenal glands. No evidence of hydronephrosis of the right or left kidney. No nephrolithiasis. Unremarkable course of the bilateral ureters. Unremarkable appearance of the urinary bladder. Stomach/Bowel: Stomach is within normal limits. Appendix appears normal. No evidence of bowel wall thickening, distention, or inflammatory changes. Vascular/Lymphatic: No significant vascular findings are present. No enlarged abdominal or pelvic lymph nodes. Reproductive: Unremarkable pelvic structures Other: Low-density free fluid layer dependently within the pelvis. Mild mesenteric edema, particularly of the upper abdomen. Musculoskeletal: No significant degenerative changes. No acute fracture. IMPRESSION: Pulmonary edema with right greater than left pleural effusions. Cardiomegaly, which is new from the comparison CT of 06/26/2012. Periportal edema, likely a manifestation of acute congestive heart failure. There is circumferential gallbladder wall thickening/fluid without radiopaque stone burden. This finding can be seen in the setting of congestive heart failure, which is the most likely reason for the CT changes. If there is concern for acute biliary symptoms/acute cholecystitis, correlation with nuclear medicine HIDA study may be considered. Mediastinal adenopathy, most likely reactive in the setting of edema, however, lymphoproliferative disorder can not be excluded. Calcified mediastinal lymph nodes, most likely secondary to prior granulomatous disease. Electronically Signed   By: Gilmer Mor D.O.   On: 05/29/2018 14:30   Ct Abdomen Pelvis W  Contrast  Result Date: 05/29/2018 CLINICAL DATA:  42 year old male with a history of shortness of breath and abdominal pain EXAM: CT CHEST, ABDOMEN, AND PELVIS WITH CONTRAST TECHNIQUE: Multidetector CT imaging of the chest, abdomen and pelvis was performed following the standard protocol during bolus administration of intravenous contrast. CONTRAST:  75mL OMNIPAQUE IOHEXOL 300 MG/ML SOLN; OMNIPAQUE IOHEXOL 300 MG/ML SOLN COMPARISON:  None. FINDINGS: CT CHEST FINDINGS Cardiovascular: Global cardiomegaly. This finding was present on comparison chest x-ray of this year, and is new from the CT of 06/26/2012 and plain film of 04/08/2009. No filling defects identified within the main pulmonary artery, lobar pulmonary arteries, or proximal pulmonary arteries. Unremarkable course caliber and contour of the thoracic aorta. Mediastinum/Nodes: Unremarkable thoracic inlet. Multiple borderline enlarged mediastinal lymph nodes. Lymph nodes of the right hilum are partially calcified. Lungs/Pleura: Diffuse interlobular septal thickening extending to the periphery with thickening of the fissures and right greater than left pleural effusion. Mild bronchial wall thickening. No endobronchial debris. Early regions of ground-glass opacity throughout the lungs. Atelectasis of the bilateral lower lobes. Musculoskeletal: No acute displaced fracture. CT ABDOMEN PELVIS FINDINGS Hepatobiliary: No focal lesion of the liver. Periportal edema. Refluxing contrast into the hepatic veins from the right heart. No hyperdense material within the gallbladder lumen, however, there is gallbladder wall thickening/pericholecystic fluid. No extrahepatic biliary ductal dilatation or intrahepatic ductal dilatation. Pancreas: Unremarkable pancreas Spleen: Unremarkable spleen Adrenals/Urinary Tract: Unremarkable appearance of the adrenal glands. No evidence of hydronephrosis of the right or left kidney. No nephrolithiasis. Unremarkable course of the  bilateral ureters. Unremarkable appearance of the urinary bladder. Stomach/Bowel: Stomach is within normal limits. Appendix appears normal.  No evidence of bowel wall thickening, distention, or inflammatory changes. Vascular/Lymphatic: No significant vascular findings are present. No enlarged abdominal or pelvic lymph nodes. Reproductive: Unremarkable pelvic structures Other: Low-density free fluid layer dependently within the pelvis. Mild mesenteric edema, particularly of the upper abdomen. Musculoskeletal: No significant degenerative changes. No acute fracture. IMPRESSION: Pulmonary edema with right greater than left pleural effusions. Cardiomegaly, which is new from the comparison CT of 06/26/2012. Periportal edema, likely a manifestation of acute congestive heart failure. There is circumferential gallbladder wall thickening/fluid without radiopaque stone burden. This finding can be seen in the setting of congestive heart failure, which is the most likely reason for the CT changes. If there is concern for acute biliary symptoms/acute cholecystitis, correlation with nuclear medicine HIDA study may be considered. Mediastinal adenopathy, most likely reactive in the setting of edema, however, lymphoproliferative disorder can not be excluded. Calcified mediastinal lymph nodes, most likely secondary to prior granulomatous disease. Electronically Signed   By: Gilmer Mor D.O.   On: 05/29/2018 14:30   Dg Chest Port 1 View  Result Date: 05/30/2018 CLINICAL DATA:  Central line placement. EXAM: PORTABLE CHEST 1 VIEW COMPARISON:  Chest radiograph May 29, 2018 FINDINGS: Interval mobile RIGHT PICC. New LEFT internal jugular central venous catheter distal tip projects in mid superior vena cava. Stable cardiomegaly. Similar interstitial prominence with increased bibasilar airspace opacities and slightly increased small pleural effusions. Calcified mediastinal lymph nodes. No pneumothorax. Soft tissue planes and  included osseous structures are non suspicious. IMPRESSION: 1. New LEFT internal jugular central venous catheter distal tip projects in mid superior vena cava. Interim removal of RIGHT PICC. No pneumothorax. 2. Stable cardiomegaly and interstitial edema. 3. Increased small pleural effusions and worsening bibasilar consolidation, possible confluent edema. Electronically Signed   By: Awilda Metro M.D.   On: 05/30/2018 01:10   Dg Chest Port 1 View  Result Date: 05/29/2018 CLINICAL DATA:  42 y/o  M; verify PICC line placement. EXAM: PORTABLE CHEST 1 VIEW COMPARISON:  05/29/2018 chest CT and chest radiograph. FINDINGS: Cardiomegaly. Reticular and hazy opacities of lungs compatible pulmonary edema. Small bilateral pleural effusions. Underlying pneumonia not excluded. Right-sided PICC line tip crosses the midline, likely in the contralateral left subclavian vein. No acute osseous abnormality identified IMPRESSION: 1. Right-sided PICC line tip crosses the midline, likely in the contralateral left subclavian vein. 2. Stable cardiomegaly, pulmonary edema, small effusions. These results will be called to the ordering clinician or representative by the Radiologist Assistant, and communication documented in the PACS or zVision Dashboard. Electronically Signed   By: Mitzi Hansen M.D.   On: 05/29/2018 20:53   Dg Chest Port 1 View  Result Date: 05/29/2018 CLINICAL DATA:  Shortness of Breath EXAM: PORTABLE CHEST 1 VIEW COMPARISON:  May 28, 2018 FINDINGS: There is cardiomegaly with pulmonary venous hypertension. There are small pleural effusions bilaterally with mild interstitial edema in the bases. There is atelectatic change in the lung bases as well. There is no frank consolidation. No adenopathy. No bone lesions. IMPRESSION: Cardiomegaly with pulmonary vascular congestion. Small pleural effusions with bibasilar edema. There may be a degree of congestive heart failure. No frank consolidation,  although there is patchy atelectatic change in the bases. Electronically Signed   By: Bretta Bang III M.D.   On: 05/29/2018 07:03   Dg Chest Portable 1 View  Result Date: 05/28/2018 CLINICAL DATA:  Shortness of breath EXAM: PORTABLE CHEST 1 VIEW COMPARISON:  04/08/2009 FINDINGS: Cardiomegaly with vascular congestion and interstitial prominence, likely interstitial edema.  No effusions or acute bony abnormality. IMPRESSION: Mild-to-moderate interstitial edema/CHF. Electronically Signed   By: Charlett Nose M.D.   On: 05/28/2018 18:31   Dg Abd Portable 1v  Result Date: 05/29/2018 CLINICAL DATA:  Epigastric pain. Nausea and diarrhea. Abdominal tightness. EXAM: PORTABLE ABDOMEN - 1 VIEW COMPARISON:  None. FINDINGS: No renal or ureteral stones identified. No evidence of bowel obstruction. No acute abnormalities. IMPRESSION: Negative. Electronically Signed   By: Gerome Sam III M.D   On: 05/29/2018 11:40   [place] Ekg Site Rite  Result Date: 05/29/2018 If Site Rite image not attached, placement could not be confirmed due to current cardiac rhythm.     Medications:     Scheduled Medications: . aspirin  81 mg Oral Daily  . buprenorphine-naloxone  1 tablet Sublingual BID  . chlordiazePOXIDE  5 mg Oral TID  . Chlorhexidine Gluconate Cloth  6 each Topical Daily  . Chlorhexidine Gluconate Cloth  6 each Topical Q0600  . digoxin  0.125 mg Oral Daily  . folic acid  1 mg Oral Daily  . furosemide  80 mg Intravenous BID  . Influenza vac split quadrivalent PF  0.5 mL Intramuscular Tomorrow-1000  . LORazepam  0-4 mg Intravenous Q4H   Followed by  . [START ON 05/31/2018] LORazepam  0-4 mg Intravenous Q12H  . multivitamin with minerals  1 tablet Oral Daily  . mupirocin ointment  1 application Nasal BID  . pneumococcal 23 valent vaccine  0.5 mL Intramuscular Tomorrow-1000  . sodium chloride flush  10-40 mL Intracatheter Q12H  . thiamine  100 mg Oral Daily   Or  . thiamine  100 mg Intravenous  Daily     Infusions: . sodium chloride    . amiodarone 30 mg/hr (05/30/18 0107)  . heparin Stopped (05/29/18 2315)     PRN Medications:  acetaminophen, buprenorphine-naloxone, ipratropium-albuterol, LORazepam **OR** LORazepam, nitroGLYCERIN, ondansetron (ZOFRAN) IV, sodium chloride flush   Assessment:   Kenneth [name] is a 42 y.o. male with h/o substance abuse including amphetamines.   He [facility]28[facility]18 with new acute systolic CHF in the setting of Afib RVR and substance abuse.    Plan/Discussion:     1. Acute systolic CHF -> cardiogenic shock - ECHO with biventricuala dysfunction. LVEF 20%.Likely in setting of chronic substance abuse and Afib RVR.  - Co-ox 50% today. CVP 8 - Will start low-dose milrinone - Volume status much improved. Will switch lasix to 40mg  po dail  - Continue digoxin - Add spiro  - Consider spiro 12.5 mg qhs (follow pressures today).  - Hold off on ACE/ARB/ARNI for now while optimizing fluid status.  - Avoid BB for now with low output and hypotension - Once more stable will need R/L cath as well as TEE/DC-CV later this week - Resume heparin (was off due to bleeding from PICC line site) - Pt is not a candidate for advanced therapies including transplant, LVAD, OR home inotrope support due to on-going substance abuse.  - Needs BMET today  2. Afib with RVR - Continue IV amiodarone - CHA2DS2-VASc is at least 2 (CHF, HTN). Unknown vascular disease. - Continue heparin for now in case he needs support device - Will need TEE/DCCV likely Thursday as he stabilizes - Will need to decide on [facility]. Ideally Eliquis but may need to consider Xarelto (once daily) due to ? Compliance.   3. Substance abuse - UDS 05/28/18 positive for opiates, BZDs, amphetamines, and THC.  - [facility] managing CIWA protocol  CRITICAL CARE Performed by: Arvilla Meres  Total critical care time: 35 minutes  Critical care time was exclusive of separately billable  procedures and treating other patients.  Critical care was necessary to treat or prevent imminent or life-threatening deterioration.  Critical care was time spent personally by me (independent of midlevel providers or residents) on the following activities: development of treatment plan with patient and/or surrogate as well as nursing, discussions with consultants, evaluation of patient's response to treatment, examination of patient, obtaining history from patient or surrogate, ordering and performing treatments and interventions, ordering and review of laboratory studies, ordering and review of radiographic studies, pulse oximetry and re-evaluation of patient's condition.    Length of Stay: 1   Arvilla Meres MD 05/30/2018, 7:06 AM  Advanced Heart Failure Team Pager [phone] (M-F; 7a - 4p)  Please contact [facility] for night-coverage after hours (4p -7a ) and weekends on [email or web address]

## 2018-05-30 NOTE — Progress Notes (Signed)
PROGRESS NOTE                                                                                                                                                                                                             Patient Demographics:    Kenneth [name], is a 42 y.o. male, DOB - Mar 05, 1976, UJW:[id number]  Admit date - 05/28/2018   Admitting Physician Hillary Bow, DO  Outpatient Primary MD for the patient is Patient, No Pcp Per  LOS - 1  Chief Complaint  Patient presents with  . Shortness of Breath       Brief Narrative  Kenneth [name] is a 41 y.o. male with medical history significant of substance abuse including amphetamine.  Patient presents to the ED with c/o SOB.  Ongoing for the past 1 week.  Worse with exertion, better at rest.  Substernal discomfort on extreme exertion.  Has had to sleep sitting up for past 3-4 days.  Some leg swelling.  No current CP, in the ER he was found to have newly diagnosed A. fib and he was in RVR, pulmonary edema, further work-up suggested his EF was 20%, he also went into heroin withdrawal, became hypotensive and then subsequently developed ARF.  He has now been moved to stepdown/ICU.  We are managing the patient with CHF team.   Subjective:   Patient in bed, appears comfortable, denies any headache, no fever, no chest pain or pressure, improved shortness of breath , no abdominal pain. No focal weakness.   Assessment  & Plan :     1. Acute on chronic systolic CHF EF 20% with wall motion abnormality on echocardiogram.  This could be ischemic, rate related cardiomyopathy or drug use/alcohol related cardiomyopathy.  He appears to be in cardiogenic shock with a low-flow state, few areas of cyanosis, low blood pressure and shortness of breath.  Placed on milrinone drip, digoxin by CHF team, avoiding beta-blocker ACE/ARB due to extremely low blood pressures and ARF.  Diurese as needed and if tolerated by blood pressure.  Currently on  low-dose Lasix and Aldactone.  Continue to monitor in stepdown.  Has received left IJ central line on 05/29/2018 for various drips.   2.  Acute on chronic diastolic CHF.  Echo pending.  Likely due to RVR, received 40 of IV Lasix with much improvement.  3.  Alcohol abuse, amphetamine abuse, marijuana abuse, benzodiazepine abuse, Heroin abuse, smoking.  Counseled to quit all.  Into heroin withdrawal, placed on Suboxone  along with CIWA protocol and low-dose Librium.  Withdrawals are better continue to monitor.  4.  Newly diagnosed A. fib with RVR.  [place] vas 2 score of at least 1-2.  Currently on amiodarone drip along with heparin drip.  Cardiology following.   5.  ARF.  Due to combination of hypotension and some IV dye use.  Currently on low-dose Lasix and Aldactone, cautiously use and monitor.  Avoid ACE.  Daily BMP.     Family Communication  : Wife bedside  Code Status : Full  Disposition Plan  : Stepdown  Consults  : Cards  Procedures  :   Echoardiogram -   - Left ventricle: The cavity size was severely dilated. Wall thickness was increased in a pattern of mild LVH. The estimated ejection fraction was 20%. Diffuse hypokinesis. The study is not technically sufficient to allow evaluation of LV diastolic function. - Aortic valve: There was trivial regurgitation. - Mitral valve: There was moderate regurgitation. - Left atrium: The atrium was moderately dilated. - Right atrium: The atrium was mildly dilated. - Atrial septum: No defect or patent foramen ovale was identified. - Tricuspid valve: There was moderate regurgitation. - Pulmonary arteries: PA peak pressure: 33 mm Hg (S).  L.IJ C Line 05/29/18   DVT Prophylaxis  : Heparin    Lab Results  Component Value Date   PLT 332 05/30/2018    Diet :  Diet Order            Diet Heart Room service appropriate? Yes; Fluid consistency: Thin  Diet effective now               Inpatient Medications Scheduled Meds: . aspirin   81 mg Oral Daily  . buprenorphine-naloxone  1 tablet Sublingual BID  . chlordiazePOXIDE  5 mg Oral TID  . Chlorhexidine Gluconate Cloth  6 each Topical Daily  . Chlorhexidine Gluconate Cloth  6 each Topical Q0600  . digoxin  0.125 mg Oral Daily  . folic acid  1 mg Oral Daily  . furosemide  40 mg Oral Daily  . Influenza vac split quadrivalent PF  0.5 mL Intramuscular Tomorrow-1000  . LORazepam  0-4 mg Intravenous Q4H   Followed by  . [START ON 05/31/2018] LORazepam  0-4 mg Intravenous Q12H  . multivitamin with minerals  1 tablet Oral Daily  . mupirocin ointment  1 application Nasal BID  . pneumococcal 23 valent vaccine  0.5 mL Intramuscular Tomorrow-1000  . sodium chloride flush  10-40 mL Intracatheter Q12H  . spironolactone  12.5 mg Oral Daily  . thiamine  100 mg Oral Daily   Or  . thiamine  100 mg Intravenous Daily   Continuous Infusions: . sodium chloride    . amiodarone 30 mg/hr (05/30/18 0107)  . heparin 1,450 Units/hr (05/30/18 0836)  . milrinone 0.125 mcg/kg/min (05/30/18 0728)   PRN Meds:.acetaminophen, buprenorphine-naloxone, ipratropium-albuterol, LORazepam **OR** LORazepam, nitroGLYCERIN, ondansetron (ZOFRAN) IV  Antibiotics  :   Anti-infectives (From admission, onward)   None          Objective:   Vitals:   05/30/18 0638 05/30/18 0645 05/30/18 0651 05/30/18 0743  BP:  110/87    Pulse: 71 95 91   Resp:  (!) 31 (!) 27   Temp:    97.8 F (36.6 C)  TempSrc:    Oral  SpO2:  92% 93%   Weight:      Height:        Wt Readings from Last 3 Encounters:  05/29/18 85.9 kg  02/16/18 81.6 kg  12/22/15 81.6 kg     Intake/Output Summary (Last 24 hours) at 05/30/2018 0851 Last data filed at 05/30/2018 0600 Gross per 24 hour  Intake 664.18 ml  Output 2100 ml  Net -1435.82 ml     Physical Exam  Awake Alert, Oriented X 3, No new F.N deficits, Normal affect [place].AT,PERRAL Supple Neck,No JVD, No cervical lymphadenopathy appriciated.  Symmetrical Chest wall  movement, Good air movement bilaterally, few rales RRR,No Gallops, Rubs or new Murmurs, No Parasternal Heave +ve B.Sounds, Abd Soft, No tenderness, No organomegaly appriciated, No rebound - guarding or rigidity. No Cyanosis, Clubbing or edema, No new Rash or bruise    Data Review:    CBC Recent Labs  Lab 05/28/18 1731 05/29/18 0159 05/30/18 0500  WBC 8.3 9.4 10.1  HGB 14.0 13.1 13.4  HCT 46.7 40.6 42.1  PLT 344 321 332  MCV 95.5 91.0 89.4  MCH 28.6 29.4 28.5  MCHC 30.0 32.3 31.8  RDW 14.3 14.1 14.2  LYMPHSABS 3.3  --   --   MONOABS 0.6  --   --   EOSABS 0.2  --   --   BASOSABS 0.1  --   --     Chemistries  Recent Labs  Lab 05/28/18 1731 05/30/18 0500  NA 136 137  K 4.4 5.1  CL 102 96*  CO2 27 28  GLUCOSE 119* 109*  BUN 13 17  CREATININE 1.09 1.33*  CALCIUM 8.6* 8.5*  MG 1.9 1.8   ------------------------------------------------------------------------------------------------------------------ No results for input(s): CHOL, HDL, LDLCALC, TRIG, CHOLHDL, LDLDIRECT in the last 72 hours.  No results found for: HGBA1C ------------------------------------------------------------------------------------------------------------------ Recent Labs    05/28/18 1755  TSH 3.162   ------------------------------------------------------------------------------------------------------------------ No results for input(s): VITAMINB12, FOLATE, FERRITIN, TIBC, IRON, RETICCTPCT in the last 72 hours.  Coagulation profile Recent Labs  Lab 05/30/18 0500  INR 1.17    No results for input(s): DDIMER in the last 72 hours.  Cardiac Enzymes No results for input(s): CKMB, TROPONINI, MYOGLOBIN in the last 168 hours.  Invalid input(s): CK ------------------------------------------------------------------------------------------------------------------    Component Value Date/Time   BNP 696.2 (H) 05/29/2018 0159    Micro Results Recent Results (from the past 240 hour(s))    MRSA PCR Screening     Status: Abnormal   Collection Time: 05/29/18  5:53 PM  Result Value Ref Range Status   MRSA by PCR POSITIVE (A) NEGATIVE Final    Comment:        The GeneXpert MRSA Assay (FDA approved for NASAL specimens only), is one component of a comprehensive MRSA colonization surveillance program. It is not intended to diagnose MRSA infection nor to guide or monitor treatment for MRSA infections. RESULT CALLED TO, READ BACK BY AND VERIFIED WITH: Darl Householder RN 05/29/18 2034 JDW Performed at [facility], [address], [place], [place] [address]     Radiology Reports Ct Chest W Contrast  Result Date: 05/29/2018 CLINICAL DATA:  42 year old male with a history of shortness of breath and abdominal pain EXAM: CT CHEST, ABDOMEN, AND PELVIS WITH CONTRAST TECHNIQUE: Multidetector CT imaging of the chest, abdomen and pelvis was performed following the standard protocol during bolus administration of intravenous contrast. CONTRAST:  75mL OMNIPAQUE IOHEXOL 300 MG/ML SOLN; OMNIPAQUE IOHEXOL 300 MG/ML SOLN COMPARISON:  None. FINDINGS: CT CHEST FINDINGS Cardiovascular: Global cardiomegaly. This finding was present on comparison chest x-ray of this year, and is new from the CT of 06/26/2012 and plain film  of 04/08/2009. No filling defects identified within the main pulmonary artery, lobar pulmonary arteries, or proximal pulmonary arteries. Unremarkable course caliber and contour of the thoracic aorta. Mediastinum/Nodes: Unremarkable thoracic inlet. Multiple borderline enlarged mediastinal lymph nodes. Lymph nodes of the right hilum are partially calcified. Lungs/Pleura: Diffuse interlobular septal thickening extending to the periphery with thickening of the fissures and right greater than left pleural effusion. Mild bronchial wall thickening. No endobronchial debris. Early regions of ground-glass opacity throughout the lungs. Atelectasis of the bilateral lower lobes.  Musculoskeletal: No acute displaced fracture. CT ABDOMEN PELVIS FINDINGS Hepatobiliary: No focal lesion of the liver. Periportal edema. Refluxing contrast into the hepatic veins from the right heart. No hyperdense material within the gallbladder lumen, however, there is gallbladder wall thickening/pericholecystic fluid. No extrahepatic biliary ductal dilatation or intrahepatic ductal dilatation. Pancreas: Unremarkable pancreas Spleen: Unremarkable spleen Adrenals/Urinary Tract: Unremarkable appearance of the adrenal glands. No evidence of hydronephrosis of the right or left kidney. No nephrolithiasis. Unremarkable course of the bilateral ureters. Unremarkable appearance of the urinary bladder. Stomach/Bowel: Stomach is within normal limits. Appendix appears normal. No evidence of bowel wall thickening, distention, or inflammatory changes. Vascular/Lymphatic: No significant vascular findings are present. No enlarged abdominal or pelvic lymph nodes. Reproductive: Unremarkable pelvic structures Other: Low-density free fluid layer dependently within the pelvis. Mild mesenteric edema, particularly of the upper abdomen. Musculoskeletal: No significant degenerative changes. No acute fracture. IMPRESSION: Pulmonary edema with right greater than left pleural effusions. Cardiomegaly, which is new from the comparison CT of 06/26/2012. Periportal edema, likely a manifestation of acute congestive heart failure. There is circumferential gallbladder wall thickening/fluid without radiopaque stone burden. This finding can be seen in the setting of congestive heart failure, which is the most likely reason for the CT changes. If there is concern for acute biliary symptoms/acute cholecystitis, correlation with nuclear medicine HIDA study may be considered. Mediastinal adenopathy, most likely reactive in the setting of edema, however, lymphoproliferative disorder can not be excluded. Calcified mediastinal lymph nodes, most likely  secondary to prior granulomatous disease. Electronically Signed   By: Gilmer Mor D.O.   On: 05/29/2018 14:30   Ct Abdomen Pelvis W Contrast  Result Date: 05/29/2018 CLINICAL DATA:  42 year old male with a history of shortness of breath and abdominal pain EXAM: CT CHEST, ABDOMEN, AND PELVIS WITH CONTRAST TECHNIQUE: Multidetector CT imaging of the chest, abdomen and pelvis was performed following the standard protocol during bolus administration of intravenous contrast. CONTRAST:  75mL OMNIPAQUE IOHEXOL 300 MG/ML SOLN; OMNIPAQUE IOHEXOL 300 MG/ML SOLN COMPARISON:  None. FINDINGS: CT CHEST FINDINGS Cardiovascular: Global cardiomegaly. This finding was present on comparison chest x-ray of this year, and is new from the CT of 06/26/2012 and plain film of 04/08/2009. No filling defects identified within the main pulmonary artery, lobar pulmonary arteries, or proximal pulmonary arteries. Unremarkable course caliber and contour of the thoracic aorta. Mediastinum/Nodes: Unremarkable thoracic inlet. Multiple borderline enlarged mediastinal lymph nodes. Lymph nodes of the right hilum are partially calcified. Lungs/Pleura: Diffuse interlobular septal thickening extending to the periphery with thickening of the fissures and right greater than left pleural effusion. Mild bronchial wall thickening. No endobronchial debris. Early regions of ground-glass opacity throughout the lungs. Atelectasis of the bilateral lower lobes. Musculoskeletal: No acute displaced fracture. CT ABDOMEN PELVIS FINDINGS Hepatobiliary: No focal lesion of the liver. Periportal edema. Refluxing contrast into the hepatic veins from the right heart. No hyperdense material within the gallbladder lumen, however, there is gallbladder wall thickening/pericholecystic fluid. No extrahepatic biliary ductal dilatation  or intrahepatic ductal dilatation. Pancreas: Unremarkable pancreas Spleen: Unremarkable spleen Adrenals/Urinary Tract: Unremarkable  appearance of the adrenal glands. No evidence of hydronephrosis of the right or left kidney. No nephrolithiasis. Unremarkable course of the bilateral ureters. Unremarkable appearance of the urinary bladder. Stomach/Bowel: Stomach is within normal limits. Appendix appears normal. No evidence of bowel wall thickening, distention, or inflammatory changes. Vascular/Lymphatic: No significant vascular findings are present. No enlarged abdominal or pelvic lymph nodes. Reproductive: Unremarkable pelvic structures Other: Low-density free fluid layer dependently within the pelvis. Mild mesenteric edema, particularly of the upper abdomen. Musculoskeletal: No significant degenerative changes. No acute fracture. IMPRESSION: Pulmonary edema with right greater than left pleural effusions. Cardiomegaly, which is new from the comparison CT of 06/26/2012. Periportal edema, likely a manifestation of acute congestive heart failure. There is circumferential gallbladder wall thickening/fluid without radiopaque stone burden. This finding can be seen in the setting of congestive heart failure, which is the most likely reason for the CT changes. If there is concern for acute biliary symptoms/acute cholecystitis, correlation with nuclear medicine HIDA study may be considered. Mediastinal adenopathy, most likely reactive in the setting of edema, however, lymphoproliferative disorder can not be excluded. Calcified mediastinal lymph nodes, most likely secondary to prior granulomatous disease. Electronically Signed   By: Gilmer Mor D.O.   On: 05/29/2018 14:30   Dg Chest Port 1 View  Result Date: 05/30/2018 CLINICAL DATA:  Central line placement. EXAM: PORTABLE CHEST 1 VIEW COMPARISON:  Chest radiograph May 29, 2018 FINDINGS: Interval mobile RIGHT PICC. New LEFT internal jugular central venous catheter distal tip projects in mid superior vena cava. Stable cardiomegaly. Similar interstitial prominence with increased bibasilar airspace  opacities and slightly increased small pleural effusions. Calcified mediastinal lymph nodes. No pneumothorax. Soft tissue planes and included osseous structures are non suspicious. IMPRESSION: 1. New LEFT internal jugular central venous catheter distal tip projects in mid superior vena cava. Interim removal of RIGHT PICC. No pneumothorax. 2. Stable cardiomegaly and interstitial edema. 3. Increased small pleural effusions and worsening bibasilar consolidation, possible confluent edema. Electronically Signed   By: Awilda Metro M.D.   On: 05/30/2018 01:10   Dg Chest Port 1 View  Result Date: 05/29/2018 CLINICAL DATA:  42 y/o  M; verify PICC line placement. EXAM: PORTABLE CHEST 1 VIEW COMPARISON:  05/29/2018 chest CT and chest radiograph. FINDINGS: Cardiomegaly. Reticular and hazy opacities of lungs compatible pulmonary edema. Small bilateral pleural effusions. Underlying pneumonia not excluded. Right-sided PICC line tip crosses the midline, likely in the contralateral left subclavian vein. No acute osseous abnormality identified IMPRESSION: 1. Right-sided PICC line tip crosses the midline, likely in the contralateral left subclavian vein. 2. Stable cardiomegaly, pulmonary edema, small effusions. These results will be called to the ordering clinician or representative by the Radiologist Assistant, and communication documented in the PACS or zVision Dashboard. Electronically Signed   By: Mitzi Hansen M.D.   On: 05/29/2018 20:53   Dg Chest Port 1 View  Result Date: 05/29/2018 CLINICAL DATA:  Shortness of Breath EXAM: PORTABLE CHEST 1 VIEW COMPARISON:  May 28, 2018 FINDINGS: There is cardiomegaly with pulmonary venous hypertension. There are small pleural effusions bilaterally with mild interstitial edema in the bases. There is atelectatic change in the lung bases as well. There is no frank consolidation. No adenopathy. No bone lesions. IMPRESSION: Cardiomegaly with pulmonary vascular  congestion. Small pleural effusions with bibasilar edema. There may be a degree of congestive heart failure. No frank consolidation, although there is patchy atelectatic change in the  bases. Electronically Signed   By: Bretta Bang III M.D.   On: 05/29/2018 07:03   Dg Chest Portable 1 View  Result Date: 05/28/2018 CLINICAL DATA:  Shortness of breath EXAM: PORTABLE CHEST 1 VIEW COMPARISON:  04/08/2009 FINDINGS: Cardiomegaly with vascular congestion and interstitial prominence, likely interstitial edema. No effusions or acute bony abnormality. IMPRESSION: Mild-to-moderate interstitial edema/CHF. Electronically Signed   By: Charlett Nose M.D.   On: 05/28/2018 18:31   Dg Abd Portable 1v  Result Date: 05/29/2018 CLINICAL DATA:  Epigastric pain. Nausea and diarrhea. Abdominal tightness. EXAM: PORTABLE ABDOMEN - 1 VIEW COMPARISON:  None. FINDINGS: No renal or ureteral stones identified. No evidence of bowel obstruction. No acute abnormalities. IMPRESSION: Negative. Electronically Signed   By: Gerome Sam III M.D   On: 05/29/2018 11:40   [place] Ekg Site Rite  Result Date: 05/29/2018 If Site Rite image not attached, placement could not be confirmed due to current cardiac rhythm.   Time Spent in minutes  30   Susa Raring M.D on 05/30/2018 at 8:51 AM  To page go to [email or web address] - password [facility]

## 2018-05-30 NOTE — Progress Notes (Signed)
ANTICOAGULATION CONSULT NOTE Pharmacy Consult for Heparin Indication: atrial fibrillation, new-onset  No Known Allergies  Patient Measurements: Height: 6\' 2"  (188 cm) Weight: 189 lb 4.8 oz (85.9 kg) IBW/kg (Calculated) : 82.2  Heparin dosing weight = actual weight 85.9 kg  Vital Signs: Temp: 98.4 F (36.9 C) (12/17 1142) Temp Source: Oral (12/17 1142) BP: 113/84 (12/17 1800) Pulse Rate: 107 (12/17 1800)  Labs: Recent Labs    05/28/18 1731 05/29/18 0159 05/30/18 0500 05/30/18 1610  HGB 14.0 13.1 13.4  --   HCT 46.7 40.6 42.1  --   PLT 344 321 332  --   LABPROT  --   --  14.8  --   INR  --   --  1.17  --   HEPARINUNFRC  --  0.12*  --  0.11*  CREATININE 1.09  --  1.33*  --     Estimated Creatinine Clearance: 85 mL/min (A) (by C-G formula based on SCr of 1.33 mg/dL (H)).   Medical History: Past Medical History:  Diagnosis Date  . Frequent headaches   . Narcotic abuse (HCC)   . PTSD (post-traumatic stress disorder)    Assessment: 42 year old male admitted on 05/28/18 with SOB and weight gain, found to have new-onset Afib and was started on IV heparin on 12/15. Not on anticoagulation PTA. Heparin stopped yesterday per Dr. Gala Romney due to patient having bleeding issues at [facility] site. Pharmacy consulted to resume heparin this AM. CBC wnl. No further active bleed issues documented.  Follow up heparin level this evening still low at 0.11. No further bleeding issues noted. Will titrate heparin.   Goal of Therapy:  Heparin level 0.3-0.7 units/ml Monitor platelets by anticoagulation protocol: Yes   Plan:  Increase IV Heparin to 1650 units / hr. No bolus with recent bleed issues 6h heparin level Monitor daily heparin level and CBC, s/sx bleeding F/u long-term anticoagulation plan  Sheppard Coil PharmD., BCPS Clinical Pharmacist 05/30/2018 6:12 PM

## 2018-05-30 NOTE — Progress Notes (Signed)
ANTICOAGULATION CONSULT NOTE Pharmacy Consult for Heparin Indication: atrial fibrillation, new-onset  No Known Allergies  Patient Measurements: Height: 6\' 2"  (188 cm) Weight: 189 lb 4.8 oz (85.9 kg) IBW/kg (Calculated) : 82.2  Heparin dosing weight = actual weight 85.9 kg  Vital Signs: Temp: 97.9 F (36.6 C) (12/17 0315) Temp Source: Oral (12/17 0315) BP: 110/87 (12/17 0645) Pulse Rate: 91 (12/17 0651)  Labs: Recent Labs    05/28/18 1731 05/29/18 0159 05/30/18 0500  HGB 14.0 13.1 13.4  HCT 46.7 40.6 42.1  PLT 344 321 332  LABPROT  --   --  14.8  INR  --   --  1.17  HEPARINUNFRC  --  0.12*  --   CREATININE 1.09  --  1.33*    Estimated Creatinine Clearance: 85 mL/min (A) (by C-G formula based on SCr of 1.33 mg/dL (H)).   Medical History: Past Medical History:  Diagnosis Date  . Frequent headaches   . Narcotic abuse (HCC)   . PTSD (post-traumatic stress disorder)    Assessment: 42 year old male admitted on 05/28/18 with SOB and weight gain, found to have new-onset Afib and was started on IV heparin on 12/15. Not on anticoagulation PTA. Heparin stopped yesterday per Dr. Gala Romney due to patient having bleeding issues at [facility] site. Pharmacy consulted to resume heparin this AM. CBC wnl. No further active bleed issues documented.  Goal of Therapy:  Heparin level 0.3-0.7 units/ml Monitor platelets by anticoagulation protocol: Yes   Plan:  Resume IV Heparin at 1450 units / hr. No bolus with recent bleed issues 6h heparin level Monitor daily heparin level and CBC, s/sx bleeding F/u long-term anticoagulation plan  Babs Bertin, PharmD, BCPS Clinical Pharmacist Clinical phone [phone] Please check AMION for all [facility] contact numbers 05/30/2018 7:45 AM

## 2018-05-31 LAB — BASIC METABOLIC PANEL
Anion gap: 10 (ref 5–15)
BUN: 20 mg/dL (ref 6–20)
CO2: 30 mmol/L (ref 22–32)
Calcium: 8.1 mg/dL — ABNORMAL LOW (ref 8.9–10.3)
Chloride: 98 mmol/L (ref 98–111)
Creatinine, Ser: 1.27 mg/dL — ABNORMAL HIGH (ref 0.61–1.24)
GFR calc Af Amer: >60 mL/min (ref >60)
GLUCOSE: 117 mg/dL — AB (ref 70–99)
Potassium: 4.4 mmol/L (ref 3.5–5.1)
Sodium: 138 mmol/L (ref 135–145)

## 2018-05-31 LAB — COOXEMETRY PANEL
Carboxyhemoglobin: 0.8 % (ref 0.5–1.5)
Methemoglobin: 1.5 % (ref 0.0–1.5)
O2 Saturation: 58.7 %
TOTAL HEMOGLOBIN: 13.3 g/dL (ref 12.0–16.0)

## 2018-05-31 LAB — HEPARIN LEVEL (UNFRACTIONATED)
Heparin Unfractionated: 0.17 IU/mL — ABNORMAL LOW (ref 0.30–0.70)
Heparin Unfractionated: 0.34 IU/mL (ref 0.30–0.70)

## 2018-05-31 LAB — CBC
HCT: 40.6 % (ref 39.0–52.0)
Hemoglobin: 13 g/dL (ref 13.0–17.0)
MCH: 29 pg (ref 26.0–34.0)
MCHC: 32 g/dL (ref 30.0–36.0)
MCV: 90.6 fL (ref 80.0–100.0)
Platelets: 321 10*3/uL (ref 150–400)
RBC: 4.48 MIL/uL (ref 4.22–5.81)
RDW: 14.6 % (ref 11.5–15.5)
WBC: 10.5 10*3/uL (ref 4.0–10.5)
nRBC: 0 % (ref 0.0–0.2)

## 2018-05-31 MED ORDER — AMIODARONE IV BOLUS ONLY 150 MG/100ML
150.0000 mg | Freq: Once | INTRAVENOUS | Status: AC
  Administered 2018-05-31: 150 mg via INTRAVENOUS

## 2018-05-31 MED ORDER — SODIUM CHLORIDE 0.9 % IV SOLN: 250.0000 mL | INTRAVENOUS | Status: DC

## 2018-05-31 MED ORDER — FUROSEMIDE 40 MG PO TABS
40.0000 mg | ORAL_TABLET | Freq: Every day | ORAL | Status: DC
  Administered 2018-05-31 – 2018-06-01 (×2): 40 mg via ORAL
  Filled 2018-05-31 (×2): qty 1

## 2018-05-31 MED ORDER — NICOTINE 14 MG/24HR TD PT24
14.0000 mg | MEDICATED_PATCH | Freq: Every day | TRANSDERMAL | Status: DC
  Administered 2018-05-31 – 2018-06-04 (×5): 14 mg via TRANSDERMAL
  Filled 2018-05-31 (×6): qty 1

## 2018-05-31 MED ORDER — FUROSEMIDE 10 MG/ML IJ SOLN
80.0000 mg | Freq: Once | INTRAMUSCULAR | Status: DC
  Filled 2018-05-31: qty 8

## 2018-05-31 MED ORDER — SODIUM CHLORIDE 0.9% FLUSH
3.0000 mL | Freq: Two times a day (BID) | INTRAVENOUS | Status: DC
  Administered 2018-05-31: 3 mL via INTRAVENOUS

## 2018-05-31 MED ORDER — SODIUM CHLORIDE 0.9% FLUSH: 3.0000 mL | INTRAVENOUS | Status: DC | PRN

## 2018-05-31 MED ORDER — LOSARTAN POTASSIUM 25 MG PO TABS: 25.0000 mg | ORAL_TABLET | Freq: Every day | ORAL | Status: DC

## 2018-05-31 MED ORDER — LOSARTAN POTASSIUM 25 MG PO TABS
25.0000 mg | ORAL_TABLET | Freq: Every day | ORAL | Status: DC
  Administered 2018-05-31 – 2018-06-01 (×2): 25 mg via ORAL
  Filled 2018-05-31 (×2): qty 1

## 2018-05-31 NOTE — Progress Notes (Signed)
ANTICOAGULATION CONSULT NOTE - Follow Up Consult  Pharmacy Consult for heparin Indication: atrial fibrillation  Labs: Recent Labs    05/28/18 1731 05/29/18 0159 05/30/18 0500 05/30/18 1610 05/31/18 0043  HGB 14.0 13.1 13.4  --   --   HCT 46.7 40.6 42.1  --   --   PLT 344 321 332  --   --   LABPROT  --   --  14.8  --   --   INR  --   --  1.17  --   --   HEPARINUNFRC  --  0.12*  --  0.11* 0.17*  CREATININE 1.09  --  1.33*  --   --     Assessment: 42yo male remains subtherapeutic on heparin; spoke w/ RN and rate was inadvertently changed back to prior rate of 1450 units/hr after RN shift change; no signs of bleeding since resuming heparin per RN.  Goal of Therapy:  Heparin level 0.3-0.7 units/ml   Plan:  Will increase heparin gtt back to 1650 units/hr and check level in 6 hours.    Vernard Gambles, PharmD, BCPS  05/31/2018,2:28 AM

## 2018-05-31 NOTE — Progress Notes (Signed)
PROGRESS NOTE    Kenneth [name]  ZOX:[id number] DOB: August 14, 1975 DOA: 05/28/2018 PCP: Patient, No Pcp Per   Brief Narrative: 42 year old male with history of substance abuse including amphetamine/narcotic abuse, PTSD scented to ER with shortness of breath ongoing for past 1 week.  His symptoms are worse with exertion better at rest, also with substernal discomfort on extreme exertion and was sleeping sitting up for past 3 to 4 days and was having some leg swelling.  In the ER he was found to have new onset atrial fibrillation with RVR, pulmonary edema.  Patient was seen and admitted further work-up showed EF 20%, also monitoring here in withdrawal and became hypotensive and subsequently developed acute renal failure, was moved to stepdown/ICU. CHF/cardiology team is following along.   Assessment & Plan:   New onset a-fib with RVR : Patient remains on IV amiodarone, heparin drip.  [id number] score at least 2 given CHF and hypertension.  Cardiology following along, noted plan for TEE/DCCV once stable Thursday versus Friday depending upon Timing.  May need Eliquis  Or once a day Xarelto due to compliance isseus.  Acute systolic CHF with cardiogenic shock. LVEF 20% with WMA.  Likely from chronic substance abuse, complicated by A. fib RVR.  BNP 600-900 and admission. Remains on milrinone drip, Lasix.  Appreciate cardiology on board, continue digoxin daily, check level next week.  Continue Aldactone, losartan.  Avoiding beta-blocker due to low output and hypotension.Noted Cardiology plan for [facility] as well as TEE/DCCV later this week.  Acute hypoxic respiratory failure due to CHF, on 4 L nasal cannula, continue and wean oxygen as tolerated.  Methamphetamine abuse/Alcohol abuse/marijuana abuse, tobacco dependence  : Positive for opiates, benzodiazepine, amphetamine and THC.  Patient is counseled regarding need to quit substance and alcohol.  Patient on Suboxone along with CIWA protocol and low-dose  Librium to manage withdrawal.  Monitor and continue supportive care.  Continue thiamine/folate.  Acute renal failure : Due to combination of cardiogenic shock, IV drug abuse.  Creatinine at 1.27 from 1.33 and 1.09.  On iv Lasix, monitor trend closely.  DVT prophylaxis: Heparin drip Code Status: Full code Family Communication: Family at the bedside Disposition Plan: on SDU for now.   Consultants:  CHF-CARDIOLOGY Procedures:  Antimicrobials:  Subjective: Seen and examined, patient is resting comfortably. Denies any complaints.  He is alert awake oriented.  Remains in A. fib rate uncontrolled, remains on milrinone amiodarone and heparin drip. Creatinine at 1.27 from 1.3. on 4 L nasal cannula saturating in high 90s.  Objective: Vitals:   05/31/18 0800 05/31/18 0900 05/31/18 1000 05/31/18 1059  BP: 104/89 102/87 99/83   Pulse: (!) 110 73 81 86  Resp: 11 13 11    Temp:      TempSrc:      SpO2: 95% 92% 93%   Weight:      Height:        Intake/Output Summary (Last 24 hours) at 05/31/2018 1104 Last data filed at 05/31/2018 0600 Gross per 24 hour  Intake 735.96 ml  Output 600 ml  Net 135.96 ml   Filed Weights   05/28/18 2055 05/29/18 0556 05/31/18 0500  Weight: 87.3 kg 85.9 kg 84.6 kg   Weight change:   Body mass index is 23.95 kg/m.  Examination:  General exam: Appears calm and comfortable, NAD, HEENT:PERRL,Oral mucosa moist, Ear/Nose normal on gross exam Respiratory system: Bilateral equal air entry, diminished at the base, no wheezes or crackles  Cardiovascular system: S1 & S2 heard,No JVD,  regularly regular. no pedal edema. Gastrointestinal system: Abdomen is  soft, non tender, non distended, BS +  Nervous System:Alert and oriented. No focal neurological deficits/moving extremities. Extremities: No edema, no clubbing,distal peripheral pulses palpable.  Monitoring device present on the left ankle Skin: No rashes, lesions,no icterus MSK: Normal muscle bulk,tone  ,power  Data Reviewed: I have personally reviewed following labs and imaging studies  CBC: Recent Labs  Lab 05/28/18 1731 05/29/18 0159 05/30/18 0500 05/31/18 0433  WBC 8.3 9.4 10.1 10.5  NEUTROABS 4.1  --   --   --   HGB 14.0 13.1 13.4 13.0  HCT 46.7 40.6 42.1 40.6  MCV 95.5 91.0 89.4 90.6  PLT 344 321 332 321   Basic Metabolic Panel: Recent Labs  Lab 05/28/18 1731 05/30/18 0500 05/31/18 0433  NA 136 137 138  K 4.4 5.1 4.4  CL 102 96* 98  CO2 27 28 30   GLUCOSE 119* 109* 117*  BUN 13 17 20   CREATININE 1.09 1.33* 1.27*  CALCIUM 8.6* 8.5* 8.1*  MG 1.9 1.8  --    GFR: Estimated Creatinine Clearance: 89 mL/min (A) (by C-G formula based on SCr of 1.27 mg/dL (H)). Liver Function Tests: No results for input(s): AST, ALT, ALKPHOS, BILITOT, PROT, ALBUMIN in the last 168 hours. No results for input(s): LIPASE, AMYLASE in the last 168 hours. No results for input(s): AMMONIA in the last 168 hours. Coagulation Profile: Recent Labs  Lab 05/30/18 0500  INR 1.17   Cardiac Enzymes: No results for input(s): CKTOTAL, CKMB, CKMBINDEX, TROPONINI in the last 168 hours. BNP (last 3 results) No results for input(s): PROBNP in the last 8760 hours. HbA1C: No results for input(s): HGBA1C in the last 72 hours. CBG: No results for input(s): GLUCAP in the last 168 hours. Lipid Profile: No results for input(s): CHOL, HDL, LDLCALC, TRIG, CHOLHDL, LDLDIRECT in the last 72 hours. Thyroid Function Tests: Recent Labs    05/28/18 1755  TSH 3.162   Anemia Panel: No results for input(s): VITAMINB12, FOLATE, FERRITIN, TIBC, IRON, RETICCTPCT in the last 72 hours. Sepsis Labs: Recent Labs  Lab 05/30/18 0101  LATICACIDVEN 1.1    Recent Results (from the past 240 hour(s))  MRSA PCR Screening     Status: Abnormal   Collection Time: 05/29/18  5:53 PM  Result Value Ref Range Status   MRSA by PCR POSITIVE (A) NEGATIVE Final    Comment:        The GeneXpert MRSA Assay (FDA approved for  NASAL specimens only), is one component of a comprehensive MRSA colonization surveillance program. It is not intended to diagnose MRSA infection nor to guide or monitor treatment for MRSA infections. RESULT CALLED TO, READ BACK BY AND VERIFIED WITH: Darl Householder RN 05/29/18 2034 JDW Performed at [facility], [address], [place], [place] [address]       Radiology Studies: Ct Chest W Contrast  Result Date: 05/29/2018 CLINICAL DATA:  42 year old male with a history of shortness of breath and abdominal pain EXAM: CT CHEST, ABDOMEN, AND PELVIS WITH CONTRAST TECHNIQUE: Multidetector CT imaging of the chest, abdomen and pelvis was performed following the standard protocol during bolus administration of intravenous contrast. CONTRAST:  75mL OMNIPAQUE IOHEXOL 300 MG/ML SOLN; OMNIPAQUE IOHEXOL 300 MG/ML SOLN COMPARISON:  None. FINDINGS: CT CHEST FINDINGS Cardiovascular: Global cardiomegaly. This finding was present on comparison chest x-ray of this year, and is new from the CT of 06/26/2012 and plain film of 04/08/2009. No filling defects identified within  the main pulmonary artery, lobar pulmonary arteries, or proximal pulmonary arteries. Unremarkable course caliber and contour of the thoracic aorta. Mediastinum/Nodes: Unremarkable thoracic inlet. Multiple borderline enlarged mediastinal lymph nodes. Lymph nodes of the right hilum are partially calcified. Lungs/Pleura: Diffuse interlobular septal thickening extending to the periphery with thickening of the fissures and right greater than left pleural effusion. Mild bronchial wall thickening. No endobronchial debris. Early regions of ground-glass opacity throughout the lungs. Atelectasis of the bilateral lower lobes. Musculoskeletal: No acute displaced fracture. CT ABDOMEN PELVIS FINDINGS Hepatobiliary: No focal lesion of the liver. Periportal edema. Refluxing contrast into the hepatic veins from the right heart. No hyperdense material within  the gallbladder lumen, however, there is gallbladder wall thickening/pericholecystic fluid. No extrahepatic biliary ductal dilatation or intrahepatic ductal dilatation. Pancreas: Unremarkable pancreas Spleen: Unremarkable spleen Adrenals/Urinary Tract: Unremarkable appearance of the adrenal glands. No evidence of hydronephrosis of the right or left kidney. No nephrolithiasis. Unremarkable course of the bilateral ureters. Unremarkable appearance of the urinary bladder. Stomach/Bowel: Stomach is within normal limits. Appendix appears normal. No evidence of bowel wall thickening, distention, or inflammatory changes. Vascular/Lymphatic: No significant vascular findings are present. No enlarged abdominal or pelvic lymph nodes. Reproductive: Unremarkable pelvic structures Other: Low-density free fluid layer dependently within the pelvis. Mild mesenteric edema, particularly of the upper abdomen. Musculoskeletal: No significant degenerative changes. No acute fracture. IMPRESSION: Pulmonary edema with right greater than left pleural effusions. Cardiomegaly, which is new from the comparison CT of 06/26/2012. Periportal edema, likely a manifestation of acute congestive heart failure. There is circumferential gallbladder wall thickening/fluid without radiopaque stone burden. This finding can be seen in the setting of congestive heart failure, which is the most likely reason for the CT changes. If there is concern for acute biliary symptoms/acute cholecystitis, correlation with nuclear medicine HIDA study may be considered. Mediastinal adenopathy, most likely reactive in the setting of edema, however, lymphoproliferative disorder can not be excluded. Calcified mediastinal lymph nodes, most likely secondary to prior granulomatous disease. Electronically Signed   By: Gilmer Mor D.O.   On: 05/29/2018 14:30   Ct Abdomen Pelvis W Contrast  Result Date: 05/29/2018 CLINICAL DATA:  42 year old male with a history of shortness of  breath and abdominal pain EXAM: CT CHEST, ABDOMEN, AND PELVIS WITH CONTRAST TECHNIQUE: Multidetector CT imaging of the chest, abdomen and pelvis was performed following the standard protocol during bolus administration of intravenous contrast. CONTRAST:  75mL OMNIPAQUE IOHEXOL 300 MG/ML SOLN; OMNIPAQUE IOHEXOL 300 MG/ML SOLN COMPARISON:  None. FINDINGS: CT CHEST FINDINGS Cardiovascular: Global cardiomegaly. This finding was present on comparison chest x-ray of this year, and is new from the CT of 06/26/2012 and plain film of 04/08/2009. No filling defects identified within the main pulmonary artery, lobar pulmonary arteries, or proximal pulmonary arteries. Unremarkable course caliber and contour of the thoracic aorta. Mediastinum/Nodes: Unremarkable thoracic inlet. Multiple borderline enlarged mediastinal lymph nodes. Lymph nodes of the right hilum are partially calcified. Lungs/Pleura: Diffuse interlobular septal thickening extending to the periphery with thickening of the fissures and right greater than left pleural effusion. Mild bronchial wall thickening. No endobronchial debris. Early regions of ground-glass opacity throughout the lungs. Atelectasis of the bilateral lower lobes. Musculoskeletal: No acute displaced fracture. CT ABDOMEN PELVIS FINDINGS Hepatobiliary: No focal lesion of the liver. Periportal edema. Refluxing contrast into the hepatic veins from the right heart. No hyperdense material within the gallbladder lumen, however, there is gallbladder wall thickening/pericholecystic fluid. No extrahepatic biliary ductal dilatation or intrahepatic ductal dilatation. Pancreas: Unremarkable pancreas  Spleen: Unremarkable spleen Adrenals/Urinary Tract: Unremarkable appearance of the adrenal glands. No evidence of hydronephrosis of the right or left kidney. No nephrolithiasis. Unremarkable course of the bilateral ureters. Unremarkable appearance of the urinary bladder. Stomach/Bowel: Stomach is within  normal limits. Appendix appears normal. No evidence of bowel wall thickening, distention, or inflammatory changes. Vascular/Lymphatic: No significant vascular findings are present. No enlarged abdominal or pelvic lymph nodes. Reproductive: Unremarkable pelvic structures Other: Low-density free fluid layer dependently within the pelvis. Mild mesenteric edema, particularly of the upper abdomen. Musculoskeletal: No significant degenerative changes. No acute fracture. IMPRESSION: Pulmonary edema with right greater than left pleural effusions. Cardiomegaly, which is new from the comparison CT of 06/26/2012. Periportal edema, likely a manifestation of acute congestive heart failure. There is circumferential gallbladder wall thickening/fluid without radiopaque stone burden. This finding can be seen in the setting of congestive heart failure, which is the most likely reason for the CT changes. If there is concern for acute biliary symptoms/acute cholecystitis, correlation with nuclear medicine HIDA study may be considered. Mediastinal adenopathy, most likely reactive in the setting of edema, however, lymphoproliferative disorder can not be excluded. Calcified mediastinal lymph nodes, most likely secondary to prior granulomatous disease. Electronically Signed   By: Gilmer Mor D.O.   On: 05/29/2018 14:30   Dg Chest Port 1 View  Result Date: 05/30/2018 CLINICAL DATA:  Central line placement. EXAM: PORTABLE CHEST 1 VIEW COMPARISON:  Chest radiograph May 29, 2018 FINDINGS: Interval mobile RIGHT PICC. New LEFT internal jugular central venous catheter distal tip projects in mid superior vena cava. Stable cardiomegaly. Similar interstitial prominence with increased bibasilar airspace opacities and slightly increased small pleural effusions. Calcified mediastinal lymph nodes. No pneumothorax. Soft tissue planes and included osseous structures are non suspicious. IMPRESSION: 1. New LEFT internal jugular central venous  catheter distal tip projects in mid superior vena cava. Interim removal of RIGHT PICC. No pneumothorax. 2. Stable cardiomegaly and interstitial edema. 3. Increased small pleural effusions and worsening bibasilar consolidation, possible confluent edema. Electronically Signed   By: Awilda Metro M.D.   On: 05/30/2018 01:10   Dg Chest Port 1 View  Result Date: 05/29/2018 CLINICAL DATA:  42 y/o  M; verify PICC line placement. EXAM: PORTABLE CHEST 1 VIEW COMPARISON:  05/29/2018 chest CT and chest radiograph. FINDINGS: Cardiomegaly. Reticular and hazy opacities of lungs compatible pulmonary edema. Small bilateral pleural effusions. Underlying pneumonia not excluded. Right-sided PICC line tip crosses the midline, likely in the contralateral left subclavian vein. No acute osseous abnormality identified IMPRESSION: 1. Right-sided PICC line tip crosses the midline, likely in the contralateral left subclavian vein. 2. Stable cardiomegaly, pulmonary edema, small effusions. These results will be called to the ordering clinician or representative by the Radiologist Assistant, and communication documented in the PACS or zVision Dashboard. Electronically Signed   By: Mitzi Hansen M.D.   On: 05/29/2018 20:53   Dg Abd Portable 1v  Result Date: 05/29/2018 CLINICAL DATA:  Epigastric pain. Nausea and diarrhea. Abdominal tightness. EXAM: PORTABLE ABDOMEN - 1 VIEW COMPARISON:  None. FINDINGS: No renal or ureteral stones identified. No evidence of bowel obstruction. No acute abnormalities. IMPRESSION: Negative. Electronically Signed   By: Gerome Sam III M.D   On: 05/29/2018 11:40   [place] Ekg Site Rite  Result Date: 05/29/2018 If Site Rite image not attached, placement could not be confirmed due to current cardiac rhythm.    Scheduled Meds: . aspirin  81 mg Oral Daily  . buprenorphine-naloxone  1 tablet Sublingual BID  .  chlordiazePOXIDE  5 mg Oral TID  . Chlorhexidine Gluconate Cloth  6 each Topical  Daily  . Chlorhexidine Gluconate Cloth  6 each Topical Q0600  . digoxin  0.125 mg Oral Daily  . folic acid  1 mg Oral Daily  . furosemide  40 mg Oral Daily  . Influenza vac split quadrivalent PF  0.5 mL Intramuscular Tomorrow-1000  . LORazepam  0-4 mg Intravenous Q12H  . losartan  25 mg Oral Daily  . multivitamin with minerals  1 tablet Oral Daily  . mupirocin ointment  1 application Nasal BID  . nicotine  14 mg Transdermal Daily  . pneumococcal 23 valent vaccine  0.5 mL Intramuscular Tomorrow-1000  . sodium chloride flush  10-40 mL Intracatheter Q12H  . sodium chloride flush  3 mL Intravenous Q12H  . spironolactone  12.5 mg Oral Daily  . thiamine  100 mg Oral Daily   Or  . thiamine  100 mg Intravenous Daily   Continuous Infusions: . sodium chloride    . sodium chloride    . amiodarone 30 mg/hr (05/31/18 1055)  . heparin 1,650 Units/hr (05/31/18 0600)  . milrinone 0.125 mcg/kg/min (05/31/18 0600)     LOS: 2 days   Time spent: More than 50% of that time was spent in counseling and/or coordination of care.  Lanae Boast, MD [facility] Pager [phone]  If 7PM-7AM, please contact night-coverage [email or web address] Password [email or web address] 05/31/2018, 11:04 AM

## 2018-05-31 NOTE — Progress Notes (Addendum)
Advanced Heart Failure Rounding Note   Subjective:    Coox 58.7% this am on milrinone 0.125 mcg/kg/min. CVP 11-12 cm. I/Os about even, though weight shows down 3 lbs.   Feeling OK this am. Denies lightheadedness or dizziness. No CP. No SOB at rest. Tired walking around room.   Cr 1.33 -> 1.27  Objective:   Weight Range:  Vital Signs:   Temp:  [97.7 F (36.5 C)-98.4 F (36.9 C)] 97.8 F (36.6 C) (12/18 0300) Pulse Rate:  [65-124] 76 (12/18 0700) Resp:  [10-33] 22 (12/18 0700) BP: (87-131)/(51-102) 131/86 (12/18 0700) SpO2:  [90 %-97 %] 93 % (12/18 0700) Weight:  [84.6 kg] 84.6 kg (12/18 0500) Last BM Date: 05/28/18  Weight change: Filed Weights   05/28/18 2055 05/29/18 0556 05/31/18 0500  Weight: 87.3 kg 85.9 kg 84.6 kg   Intake/Output:   Intake/Output Summary (Last 24 hours) at 05/31/2018 0714 Last data filed at 05/31/2018 0600 Gross per 24 hour  Intake 1052.41 ml  Output 1100 ml  Net -47.59 ml   Physical Exam   General: Fatigued appearing. NAD.  HEENT: Normal Neck: Supple. JVP 10 cm+. LIJ TLC.  Carotids 2+ bilat; no bruits. No thyromegaly or nodule noted. Cor: PMI nondisplaced. IRR. +S3 Lungs: CTAB, normal effort. Abdomen: Soft, non-tender, non-distended, no HSM. No bruits or masses. +BS  Extremities: No cyanosis, clubbing, or rash. R and LLE no edema.  Neuro: Alert & orientedx3, cranial nerves grossly intact. moves all 4 extremities w/o difficulty. Affect pleasant   Telemetry   AF 90-100s, personally reviewed.  Labs    Basic Metabolic Panel: Recent Labs  Lab 05/28/18 1731 05/30/18 0500 05/31/18 0433  NA 136 137 138  K 4.4 5.1 4.4  CL 102 96* 98  CO2 27 28 30   GLUCOSE 119* 109* 117*  BUN 13 17 20   CREATININE 1.09 1.33* 1.27*  CALCIUM 8.6* 8.5* 8.1*  MG 1.9 1.8  --     Liver Function Tests: No results for input(s): AST, ALT, ALKPHOS, BILITOT, PROT, ALBUMIN in the last 168 hours. No results for input(s): LIPASE, AMYLASE in the last  168 hours. No results for input(s): AMMONIA in the last 168 hours.  CBC: Recent Labs  Lab 05/28/18 1731 05/29/18 0159 05/30/18 0500 05/31/18 0433  WBC 8.3 9.4 10.1 10.5  NEUTROABS 4.1  --   --   --   HGB 14.0 13.1 13.4 13.0  HCT 46.7 40.6 42.1 40.6  MCV 95.5 91.0 89.4 90.6  PLT 344 321 332 321    Cardiac Enzymes: No results for input(s): CKTOTAL, CKMB, CKMBINDEX, TROPONINI in the last 168 hours.  BNP: BNP (last 3 results) Recent Labs    05/28/18 1731 05/29/18 0159  BNP 931.2* 696.2*    ProBNP (last 3 results) No results for input(s): PROBNP in the last 8760 hours.    Other results:  Imaging: Ct Chest W Contrast  Result Date: 05/29/2018 CLINICAL DATA:  42 year old male with a history of shortness of breath and abdominal pain EXAM: CT CHEST, ABDOMEN, AND PELVIS WITH CONTRAST TECHNIQUE: Multidetector CT imaging of the chest, abdomen and pelvis was performed following the standard protocol during bolus administration of intravenous contrast. CONTRAST:  75mL OMNIPAQUE IOHEXOL 300 MG/ML SOLN; OMNIPAQUE IOHEXOL 300 MG/ML SOLN COMPARISON:  None. FINDINGS: CT CHEST FINDINGS Cardiovascular: Global cardiomegaly. This finding was present on comparison chest x-ray of this year, and is new from the CT of 06/26/2012 and plain film of 04/08/2009. No filling defects identified within  the main pulmonary artery, lobar pulmonary arteries, or proximal pulmonary arteries. Unremarkable course caliber and contour of the thoracic aorta. Mediastinum/Nodes: Unremarkable thoracic inlet. Multiple borderline enlarged mediastinal lymph nodes. Lymph nodes of the right hilum are partially calcified. Lungs/Pleura: Diffuse interlobular septal thickening extending to the periphery with thickening of the fissures and right greater than left pleural effusion. Mild bronchial wall thickening. No endobronchial debris. Early regions of ground-glass opacity throughout the lungs. Atelectasis of the bilateral  lower lobes. Musculoskeletal: No acute displaced fracture. CT ABDOMEN PELVIS FINDINGS Hepatobiliary: No focal lesion of the liver. Periportal edema. Refluxing contrast into the hepatic veins from the right heart. No hyperdense material within the gallbladder lumen, however, there is gallbladder wall thickening/pericholecystic fluid. No extrahepatic biliary ductal dilatation or intrahepatic ductal dilatation. Pancreas: Unremarkable pancreas Spleen: Unremarkable spleen Adrenals/Urinary Tract: Unremarkable appearance of the adrenal glands. No evidence of hydronephrosis of the right or left kidney. No nephrolithiasis. Unremarkable course of the bilateral ureters. Unremarkable appearance of the urinary bladder. Stomach/Bowel: Stomach is within normal limits. Appendix appears normal. No evidence of bowel wall thickening, distention, or inflammatory changes. Vascular/Lymphatic: No significant vascular findings are present. No enlarged abdominal or pelvic lymph nodes. Reproductive: Unremarkable pelvic structures Other: Low-density free fluid layer dependently within the pelvis. Mild mesenteric edema, particularly of the upper abdomen. Musculoskeletal: No significant degenerative changes. No acute fracture. IMPRESSION: Pulmonary edema with right greater than left pleural effusions. Cardiomegaly, which is new from the comparison CT of 06/26/2012. Periportal edema, likely a manifestation of acute congestive heart failure. There is circumferential gallbladder wall thickening/fluid without radiopaque stone burden. This finding can be seen in the setting of congestive heart failure, which is the most likely reason for the CT changes. If there is concern for acute biliary symptoms/acute cholecystitis, correlation with nuclear medicine HIDA study may be considered. Mediastinal adenopathy, most likely reactive in the setting of edema, however, lymphoproliferative disorder can not be excluded. Calcified mediastinal lymph nodes, most  likely secondary to prior granulomatous disease. Electronically Signed   By: Gilmer Mor D.O.   On: 05/29/2018 14:30   Ct Abdomen Pelvis W Contrast  Result Date: 05/29/2018 CLINICAL DATA:  42 year old male with a history of shortness of breath and abdominal pain EXAM: CT CHEST, ABDOMEN, AND PELVIS WITH CONTRAST TECHNIQUE: Multidetector CT imaging of the chest, abdomen and pelvis was performed following the standard protocol during bolus administration of intravenous contrast. CONTRAST:  42mL OMNIPAQUE IOHEXOL 300 MG/ML SOLN; OMNIPAQUE IOHEXOL 300 MG/ML SOLN COMPARISON:  None. FINDINGS: CT CHEST FINDINGS Cardiovascular: Global cardiomegaly. This finding was present on comparison chest x-ray of this year, and is new from the CT of 06/26/2012 and plain film of 04/08/2009. No filling defects identified within the main pulmonary artery, lobar pulmonary arteries, or proximal pulmonary arteries. Unremarkable course caliber and contour of the thoracic aorta. Mediastinum/Nodes: Unremarkable thoracic inlet. Multiple borderline enlarged mediastinal lymph nodes. Lymph nodes of the right hilum are partially calcified. Lungs/Pleura: Diffuse interlobular septal thickening extending to the periphery with thickening of the fissures and right greater than left pleural effusion. Mild bronchial wall thickening. No endobronchial debris. Early regions of ground-glass opacity throughout the lungs. Atelectasis of the bilateral lower lobes. Musculoskeletal: No acute displaced fracture. CT ABDOMEN PELVIS FINDINGS Hepatobiliary: No focal lesion of the liver. Periportal edema. Refluxing contrast into the hepatic veins from the right heart. No hyperdense material within the gallbladder lumen, however, there is gallbladder wall thickening/pericholecystic fluid. No extrahepatic biliary ductal dilatation or intrahepatic ductal dilatation. Pancreas: Unremarkable pancreas  Spleen: Unremarkable spleen Adrenals/Urinary Tract: Unremarkable  appearance of the adrenal glands. No evidence of hydronephrosis of the right or left kidney. No nephrolithiasis. Unremarkable course of the bilateral ureters. Unremarkable appearance of the urinary bladder. Stomach/Bowel: Stomach is within normal limits. Appendix appears normal. No evidence of bowel wall thickening, distention, or inflammatory changes. Vascular/Lymphatic: No significant vascular findings are present. No enlarged abdominal or pelvic lymph nodes. Reproductive: Unremarkable pelvic structures Other: Low-density free fluid layer dependently within the pelvis. Mild mesenteric edema, particularly of the upper abdomen. Musculoskeletal: No significant degenerative changes. No acute fracture. IMPRESSION: Pulmonary edema with right greater than left pleural effusions. Cardiomegaly, which is new from the comparison CT of 06/26/2012. Periportal edema, likely a manifestation of acute congestive heart failure. There is circumferential gallbladder wall thickening/fluid without radiopaque stone burden. This finding can be seen in the setting of congestive heart failure, which is the most likely reason for the CT changes. If there is concern for acute biliary symptoms/acute cholecystitis, correlation with nuclear medicine HIDA study may be considered. Mediastinal adenopathy, most likely reactive in the setting of edema, however, lymphoproliferative disorder can not be excluded. Calcified mediastinal lymph nodes, most likely secondary to prior granulomatous disease. Electronically Signed   By: Gilmer Mor D.O.   On: 05/29/2018 14:30   Dg Chest Port 1 View  Result Date: 05/30/2018 CLINICAL DATA:  Central line placement. EXAM: PORTABLE CHEST 1 VIEW COMPARISON:  Chest radiograph May 29, 2018 FINDINGS: Interval mobile RIGHT PICC. New LEFT internal jugular central venous catheter distal tip projects in mid superior vena cava. Stable cardiomegaly. Similar interstitial prominence with increased bibasilar airspace  opacities and slightly increased small pleural effusions. Calcified mediastinal lymph nodes. No pneumothorax. Soft tissue planes and included osseous structures are non suspicious. IMPRESSION: 1. New LEFT internal jugular central venous catheter distal tip projects in mid superior vena cava. Interim removal of RIGHT PICC. No pneumothorax. 2. Stable cardiomegaly and interstitial edema. 3. Increased small pleural effusions and worsening bibasilar consolidation, possible confluent edema. Electronically Signed   By: Awilda Metro M.D.   On: 05/30/2018 01:10   Dg Chest Port 1 View  Result Date: 05/29/2018 CLINICAL DATA:  42 y/o  M; verify PICC line placement. EXAM: PORTABLE CHEST 1 VIEW COMPARISON:  05/29/2018 chest CT and chest radiograph. FINDINGS: Cardiomegaly. Reticular and hazy opacities of lungs compatible pulmonary edema. Small bilateral pleural effusions. Underlying pneumonia not excluded. Right-sided PICC line tip crosses the midline, likely in the contralateral left subclavian vein. No acute osseous abnormality identified IMPRESSION: 1. Right-sided PICC line tip crosses the midline, likely in the contralateral left subclavian vein. 2. Stable cardiomegaly, pulmonary edema, small effusions. These results will be called to the ordering clinician or representative by the Radiologist Assistant, and communication documented in the PACS or zVision Dashboard. Electronically Signed   By: Mitzi Hansen M.D.   On: 05/29/2018 20:53   Dg Abd Portable 1v  Result Date: 05/29/2018 CLINICAL DATA:  Epigastric pain. Nausea and diarrhea. Abdominal tightness. EXAM: PORTABLE ABDOMEN - 1 VIEW COMPARISON:  None. FINDINGS: No renal or ureteral stones identified. No evidence of bowel obstruction. No acute abnormalities. IMPRESSION: Negative. Electronically Signed   By: Gerome Sam III M.D   On: 05/29/2018 11:40   [place] Ekg Site Rite  Result Date: 05/29/2018 If Site Rite image not attached, placement could  not be confirmed due to current cardiac rhythm.    Medications:     Scheduled Medications: . aspirin  81 mg Oral Daily  . buprenorphine-naloxone  1 tablet Sublingual BID  . chlordiazePOXIDE  5 mg Oral TID  . Chlorhexidine Gluconate Cloth  6 each Topical Daily  . Chlorhexidine Gluconate Cloth  6 each Topical Q0600  . digoxin  0.125 mg Oral Daily  . folic acid  1 mg Oral Daily  . furosemide  40 mg Oral Daily  . Influenza vac split quadrivalent PF  0.5 mL Intramuscular Tomorrow-1000  . LORazepam  0-4 mg Intravenous Q4H   Followed by  . LORazepam  0-4 mg Intravenous Q12H  . multivitamin with minerals  1 tablet Oral Daily  . mupirocin ointment  1 application Nasal BID  . pneumococcal 23 valent vaccine  0.5 mL Intramuscular Tomorrow-1000  . sodium chloride flush  10-40 mL Intracatheter Q12H  . spironolactone  12.5 mg Oral Daily  . thiamine  100 mg Oral Daily   Or  . thiamine  100 mg Intravenous Daily    Infusions: . sodium chloride    . amiodarone 30 mg/hr (05/31/18 0600)  . heparin 1,650 Units/hr (05/31/18 0600)  . milrinone 0.125 mcg/kg/min (05/31/18 0600)    PRN Medications: acetaminophen, ipratropium-albuterol, LORazepam **OR** LORazepam, nitroGLYCERIN, ondansetron (ZOFRAN) IV   Assessment:   Kenneth [name] is a 42 y.o. male with h/o substance abuse including amphetamines.   He presented to [facility] 05/28/18 with new acute systolic CHF in the setting of Afib RVR and substance abuse.    Plan/Discussion:     1. Acute systolic CHF -> cardiogenic shock - ECHO with biventricuala dysfunction. LVEF 20%.Likely in setting of chronic substance abuse and Afib RVR.  - Co-ox 58.7% on milrinone 0.125 mcg/kg/min.  - Volume status up again.  - Will give lasix 80 mg this am, and recheck CVP this afternoon to decide on evening dose or transition to po.  - Continue digoxin 0.125 mg daily. Check level next week.  - Continue spiro 12.5 mg daily.  - Start losartan 25 mg qhs.  -  Avoid BB for now with low output and hypotension - Continue heparin for now. Will need R/LHC as well as TEE/DCCV later this week. May need to proceed with L/RHC first, so anticoag does not need to be held post DCCV.  - Pt is not a candidate for advanced therapies including transplant, LVAD, OR home inotrope support due to on-going substance abuse.   2. Afib with RVR - Continue IV amiodarone - CHA2DS2-VASc is at least 2 (CHF, HTN). Unknown vascular disease. - Continue heparin for now in case he needs support device - Will need TEE/DCCV once stable. Thursday vs Friday depending on cath timing.  - Will need to decide on [facility]. Ideally Eliquis but may need to consider Xarelto (once daily) due to ? Compliance.   3. Substance abuse - UDS 05/28/18 positive for opiates, BZDs, amphetamines, and THC.  - [facility] managing CIWA protocol. Stable.   Graciella Freer, PA-C  05/31/2018, 7:14 AM  Advanced Heart Failure Team Pager [phone] (M-F; 7a - 4p)  Please contact [facility] for night-coverage after hours (4p -7a ) and weekends on [email or web address]  Patient seen with PA, agree with the above note.   He is doing well today.  On my check, CVP is 7.  Co-ox 58%. Feels good today.  Remains in atrial fibrillation.   On exam, no JVD.  Irregular S1S2 mildly tachy.  Clear lungs.   Biventricular failure, EF 20%.  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   He will get Lasix 40 mg daily.  Continue spironolactone and digoxin.  Add losartan 25 mg daily with plan to transition to [facility] eventually.    Plan will be to continue heparin gtt with RHC/LHC tomorrow.  On Friday, will need TEE-guided DCCV and transition from heparin gtt to DOAC.  Procedures discussed with patient (risks/benefits) and he agrees.   Marca Ancona 05/31/2018 9:01 AM

## 2018-05-31 NOTE — Progress Notes (Signed)
ANTICOAGULATION CONSULT NOTE Pharmacy Consult for Heparin Indication: atrial fibrillation, new-onset  No Known Allergies  Patient Measurements: Height: 6\' 2"  (188 cm) Weight: 186 lb 8.2 oz (84.6 kg) IBW/kg (Calculated) : 82.2  Heparin dosing weight = actual weight 85.9 kg  Vital Signs: Temp: 98.1 F (36.7 C) (12/18 0742) Temp Source: Oral (12/18 0742) BP: 99/83 (12/18 1000) Pulse Rate: 86 (12/18 1059)  Labs: Recent Labs    05/28/18 1731  05/29/18 0159 05/30/18 0500 05/30/18 1610 05/31/18 0043 05/31/18 0433 05/31/18 0858  HGB 14.0  --  13.1 13.4  --   --  13.0  --   HCT 46.7  --  40.6 42.1  --   --  40.6  --   PLT 344  --  321 332  --   --  321  --   LABPROT  --   --   --  14.8  --   --   --   --   INR  --   --   --  1.17  --   --   --   --   HEPARINUNFRC  --    < > 0.12*  --  0.11* 0.17*  --  0.34  CREATININE 1.09  --   --  1.33*  --   --  1.27*  --    < > = values in this interval not displayed.    Estimated Creatinine Clearance: 89 mL/min (A) (by C-G formula based on SCr of 1.27 mg/dL (H)).   Medical History: Past Medical History:  Diagnosis Date  . Frequent headaches   . Narcotic abuse (HCC)   . PTSD (post-traumatic stress disorder)    Assessment: 42 year old male admitted on 05/28/18 with SOB and weight gain, found to have new-onset Afib and was started on IV heparin on 12/15. Not on anticoagulation PTA.  Heparin held temporarily on 12/17 due to bleeding issues, has since been resumed and now therapeutic, CBC stable.  Goal of Therapy:  Heparin level 0.3-0.7 units/ml Monitor platelets by anticoagulation protocol: Yes   Plan:  -Continue heparin 1650 units/hr -Daily heparin level and CBC  Fredonia Highland, PharmD, BCPS Clinical Pharmacist [phone] Please check AMION for all [facility] Pharmacy numbers 05/31/2018

## 2018-06-01 ENCOUNTER — Encounter (HOSPITAL_COMMUNITY): Payer: Self-pay | Admitting: Student

## 2018-06-01 ENCOUNTER — Encounter (HOSPITAL_COMMUNITY)
Admission: EM | Disposition: A | Discharge status: Home / Self Care | Payer: Self-pay | Source: Home / Self Care | Attending: Internal Medicine

## 2018-06-01 DIAGNOSIS — I5021 Acute systolic (congestive) heart failure: Secondary | ICD-10-CM

## 2018-06-01 HISTORY — PX: RIGHT/LEFT HEART CATH AND CORONARY ANGIOGRAPHY: CATH118266

## 2018-06-01 LAB — POCT I-STAT 3, ART BLOOD GAS (G3+)
Acid-Base Excess: 5 mmol/L — ABNORMAL HIGH (ref 0.0–2.0)
Bicarbonate: 30.2 mmol/L — ABNORMAL HIGH (ref 20.0–28.0)
O2 SAT: 95 %
PH ART: 7.446 (ref 7.350–7.450)
TCO2: 32 mmol/L (ref 22–32)
pCO2 arterial: 43.9 mmHg (ref 32.0–48.0)
pO2, Arterial: 73 mmHg — ABNORMAL LOW (ref 83.0–108.0)

## 2018-06-01 LAB — POCT I-STAT 3, VENOUS BLOOD GAS (G3P V)
ACID-BASE EXCESS: 7 mmol/L — AB (ref 0.0–2.0)
Acid-Base Excess: 7 mmol/L — ABNORMAL HIGH (ref 0.0–2.0)
Bicarbonate: 25.5 mmol/L (ref 20.0–28.0)
Bicarbonate: 32.2 mmol/L — ABNORMAL HIGH (ref 20.0–28.0)
Bicarbonate: 32.4 mmol/L — ABNORMAL HIGH (ref 20.0–28.0)
O2 SAT: 73 %
O2 Saturation: 69 %
O2 Saturation: 70 %
TCO2: 27 mmol/L (ref 22–32)
TCO2: 34 mmol/L — ABNORMAL HIGH (ref 22–32)
TCO2: 34 mmol/L — ABNORMAL HIGH (ref 22–32)
pCO2, Ven: 42.5 mmHg — ABNORMAL LOW (ref 44.0–60.0)
pCO2, Ven: 46.8 mmHg (ref 44.0–60.0)
pCO2, Ven: 48.1 mmHg (ref 44.0–60.0)
pH, Ven: 7.387 (ref 7.250–7.430)
pH, Ven: 7.434 — ABNORMAL HIGH (ref 7.250–7.430)
pH, Ven: 7.448 — ABNORMAL HIGH (ref 7.250–7.430)
pO2, Ven: 36 mmHg (ref 32.0–45.0)
pO2, Ven: 37 mmHg (ref 32.0–45.0)
pO2, Ven: 37 mmHg (ref 32.0–45.0)

## 2018-06-01 LAB — CBC
HCT: 41.7 % (ref 39.0–52.0)
Hemoglobin: 13.5 g/dL (ref 13.0–17.0)
MCH: 29.8 pg (ref 26.0–34.0)
MCHC: 32.4 g/dL (ref 30.0–36.0)
MCV: 92.1 fL (ref 80.0–100.0)
Platelets: 328 10*3/uL (ref 150–400)
RBC: 4.53 MIL/uL (ref 4.22–5.81)
RDW: 14.4 % (ref 11.5–15.5)
WBC: 11.8 10*3/uL — ABNORMAL HIGH (ref 4.0–10.5)
nRBC: 0 % (ref 0.0–0.2)

## 2018-06-01 LAB — BASIC METABOLIC PANEL
Anion gap: 12 (ref 5–15)
BUN: 12 mg/dL (ref 6–20)
CO2: 29 mmol/L (ref 22–32)
CREATININE: 1.05 mg/dL (ref 0.61–1.24)
Calcium: 8.3 mg/dL — ABNORMAL LOW (ref 8.9–10.3)
Chloride: 98 mmol/L (ref 98–111)
GFR calc Af Amer: >60 mL/min (ref >60)
GFR calc non Af Amer: >60 mL/min (ref >60)
Glucose, Bld: 107 mg/dL — ABNORMAL HIGH (ref 70–99)
Potassium: 3.8 mmol/L (ref 3.5–5.1)
Sodium: 139 mmol/L (ref 135–145)

## 2018-06-01 LAB — COOXEMETRY PANEL
Carboxyhemoglobin: 1 % (ref 0.5–1.5)
Methemoglobin: 1.6 % — ABNORMAL HIGH (ref 0.0–1.5)
O2 Saturation: 59.5 %
Total hemoglobin: 13.8 g/dL (ref 12.0–16.0)

## 2018-06-01 LAB — HEPARIN LEVEL (UNFRACTIONATED): Heparin Unfractionated: 0.63 IU/mL (ref 0.30–0.70)

## 2018-06-01 LAB — MAGNESIUM: Magnesium: 1.8 mg/dL (ref 1.7–2.4)

## 2018-06-01 SURGERY — RIGHT/LEFT HEART CATH AND CORONARY ANGIOGRAPHY
Anesthesia: LOCAL

## 2018-06-01 MED ORDER — AMIODARONE HCL IN DEXTROSE 360-4.14 MG/200ML-% IV SOLN
INTRAVENOUS | Status: AC
  Filled 2018-06-01: qty 200

## 2018-06-01 MED ORDER — SODIUM CHLORIDE 0.9 % IV SOLN: INTRAVENOUS | Status: DC

## 2018-06-01 MED ORDER — ONDANSETRON HCL 4 MG/2ML IJ SOLN: 4.0000 mg | Freq: Four times a day (QID) | INTRAMUSCULAR | Status: DC | PRN

## 2018-06-01 MED ORDER — MIDAZOLAM HCL 2 MG/2ML IJ SOLN
INTRAMUSCULAR | Status: DC | PRN
  Administered 2018-06-01: 2 mg via INTRAVENOUS

## 2018-06-01 MED ORDER — HEPARIN (PORCINE) 25000 UT/250ML-% IV SOLN
1650.0000 [IU]/h | INTRAVENOUS | Status: AC
  Administered 2018-06-02: 1650 [IU]/h via INTRAVENOUS
  Filled 2018-06-01: qty 250

## 2018-06-01 MED ORDER — FENTANYL CITRATE (PF) 100 MCG/2ML IJ SOLN
INTRAMUSCULAR | Status: DC | PRN
  Administered 2018-06-01: 25 ug via INTRAVENOUS

## 2018-06-01 MED ORDER — VERAPAMIL HCL 2.5 MG/ML IV SOLN
INTRAVENOUS | Status: AC
  Filled 2018-06-01: qty 2

## 2018-06-01 MED ORDER — ACETAMINOPHEN 325 MG PO TABS: 650.0000 mg | ORAL_TABLET | ORAL | Status: DC | PRN

## 2018-06-01 MED ORDER — LIDOCAINE HCL (PF) 1 % IJ SOLN
INTRAMUSCULAR | Status: DC | PRN
  Administered 2018-06-01 (×2): 2 mL

## 2018-06-01 MED ORDER — AMIODARONE HCL 150 MG/3ML IV SOLN
INTRAVENOUS | Status: AC
  Filled 2018-06-01: qty 3

## 2018-06-01 MED ORDER — SODIUM CHLORIDE 0.9 % IV SOLN
250.0000 mL | INTRAVENOUS | Status: DC | PRN
  Administered 2018-06-02: 12:00:00 via INTRAVENOUS

## 2018-06-01 MED ORDER — SODIUM CHLORIDE 0.9 % IV SOLN
INTRAVENOUS | Status: DC
  Administered 2018-06-01: 09:00:00 via INTRAVENOUS

## 2018-06-01 MED ORDER — MIDAZOLAM HCL 2 MG/2ML IJ SOLN
INTRAMUSCULAR | Status: AC
  Filled 2018-06-01: qty 2

## 2018-06-01 MED ORDER — LORAZEPAM 1 MG PO TABS
1.0000 mg | ORAL_TABLET | Freq: Four times a day (QID) | ORAL | Status: DC | PRN
  Administered 2018-06-02 – 2018-06-03 (×3): 1 mg via ORAL
  Filled 2018-06-01 (×3): qty 1

## 2018-06-01 MED ORDER — HEPARIN (PORCINE) IN NACL 1000-0.9 UT/500ML-% IV SOLN
INTRAVENOUS | Status: AC
  Filled 2018-06-01: qty 1500

## 2018-06-01 MED ORDER — HEPARIN SODIUM (PORCINE) 1000 UNIT/ML IJ SOLN
INTRAMUSCULAR | Status: DC | PRN
  Administered 2018-06-01: 4000 [IU] via INTRAVENOUS

## 2018-06-01 MED ORDER — SODIUM CHLORIDE 0.9% FLUSH: 3.0000 mL | INTRAVENOUS | Status: DC | PRN

## 2018-06-01 MED ORDER — FENTANYL CITRATE (PF) 100 MCG/2ML IJ SOLN
INTRAMUSCULAR | Status: AC
  Filled 2018-06-01: qty 2

## 2018-06-01 MED ORDER — SODIUM CHLORIDE 0.9 % IV SOLN: 250.0000 mL | INTRAVENOUS | Status: DC | PRN

## 2018-06-01 MED ORDER — HEPARIN SODIUM (PORCINE) 1000 UNIT/ML IJ SOLN
INTRAMUSCULAR | Status: AC
  Filled 2018-06-01: qty 1

## 2018-06-01 MED ORDER — LIDOCAINE HCL (PF) 1 % IJ SOLN
INTRAMUSCULAR | Status: AC
  Filled 2018-06-01: qty 30

## 2018-06-01 MED ORDER — IOHEXOL 350 MG/ML SOLN
INTRAVENOUS | Status: DC | PRN
  Administered 2018-06-01: 35 mL via INTRA_ARTERIAL

## 2018-06-01 MED ORDER — SPIRONOLACTONE 25 MG PO TABS
25.0000 mg | ORAL_TABLET | Freq: Every day | ORAL | Status: DC
  Administered 2018-06-01: 25 mg via ORAL
  Filled 2018-06-01: qty 1

## 2018-06-01 MED ORDER — MAGNESIUM SULFATE 2 GM/50ML IV SOLN
2.0000 g | Freq: Once | INTRAVENOUS | Status: AC
  Administered 2018-06-01: 2 g via INTRAVENOUS
  Filled 2018-06-01: qty 50

## 2018-06-01 MED ORDER — SODIUM CHLORIDE 0.9% FLUSH: 3.0000 mL | Freq: Two times a day (BID) | INTRAVENOUS | Status: DC

## 2018-06-01 MED ORDER — SODIUM CHLORIDE 0.9 % IV SOLN: INTRAVENOUS | Status: AC

## 2018-06-01 MED ORDER — HEPARIN (PORCINE) IN NACL 1000-0.9 UT/500ML-% IV SOLN
INTRAVENOUS | Status: DC | PRN
  Administered 2018-06-01 (×2): 500 mL

## 2018-06-01 MED ORDER — VERAPAMIL HCL 2.5 MG/ML IV SOLN
INTRAVENOUS | Status: DC | PRN
  Administered 2018-06-01: 10 mL via INTRA_ARTERIAL

## 2018-06-01 SURGICAL SUPPLY — 13 items
CATH 5FR JL3.5 JR4 ANG PIG MP (CATHETERS) ×1 IMPLANT
CATH BALLN WEDGE 5F 110CM (CATHETERS) ×1 IMPLANT
DEVICE RAD COMP TR BAND LRG (VASCULAR PRODUCTS) ×2 IMPLANT
GLIDESHEATH SLEND SS 6F .021 (SHEATH) ×2 IMPLANT
GUIDEWIRE INQWIRE 1.5J.035X260 (WIRE) IMPLANT
INQWIRE 1.5J .035X260CM (WIRE) ×2
KIT HEART LEFT (KITS) ×2 IMPLANT
PACK CARDIAC CATHETERIZATION (CUSTOM PROCEDURE TRAY) ×2 IMPLANT
SHEATH GLIDE SLENDER 4/5FR (SHEATH) ×2 IMPLANT
TRANSDUCER W/STOPCOCK (MISCELLANEOUS) ×2 IMPLANT
TUBING CIL FLEX 10 FLL-RA (TUBING) ×2 IMPLANT
WIRE EMERALD 3MM-J .025X260CM (WIRE) ×1 IMPLANT
WIRE HI TORQ VERSACORE-J 145CM (WIRE) ×1 IMPLANT

## 2018-06-01 NOTE — Progress Notes (Addendum)
Advanced Heart Failure Rounding Note   Subjective:    Coox 59.5% this am on milrinone 0.125 mcg/kg/min. CVP 7-8 cm Weight shows down 13 lbs from admit.    Feeling better today. Frustrated with line in his neck. Denies SOB, and no longer orthopneic.   Cr 1.33 -> 1.27 -> 1.05  Objective:   Weight Range:  Vital Signs:   Temp:  [98.1 F (36.7 C)-98.2 F (36.8 C)] 98.2 F (36.8 C) (12/19 0300) Pulse Rate:  [64-190] 96 (12/19 0700) Resp:  [11-27] 16 (12/19 0700) BP: (98-222)/(64-189) 111/90 (12/19 0700) SpO2:  [86 %-97 %] 94 % (12/19 0700) Weight:  [80.4 kg] 80.4 kg (12/19 0500) Last BM Date: 05/28/18  Weight change: Filed Weights   05/29/18 0556 05/31/18 0500 06/01/18 0500  Weight: 85.9 kg 84.6 kg 80.4 kg   Intake/Output:   Intake/Output Summary (Last 24 hours) at 06/01/2018 0719 Last data filed at 06/01/2018 0700 Gross per 24 hour  Intake 1085.21 ml  Output 2750 ml  Net -1664.79 ml   Physical Exam   General: NAD HEENT: Normal Neck: Supple. JVP 7-8. LIJ TLC. Carotids 2+ bilat; no bruits. No thyromegaly or nodule noted. Cor: PMI nondisplaced. Irregularly irregular.  Lungs: CTAB, normal effort. Abdomen: Soft, non-tender, non-distended, no HSM. No bruits or masses. +BS  Extremities: No cyanosis, clubbing, or rash. R and LLE no edema.  Neuro: Alert & orientedx3, cranial nerves grossly intact. moves all 4 extremities w/o difficulty. Affect pleasant   Telemetry   AF 90-100s, personally reviewed.   Labs    Basic Metabolic Panel: Recent Labs  Lab 05/28/18 1731 05/30/18 0500 05/31/18 0433 06/01/18 0536  NA 136 137 138 139  K 4.4 5.1 4.4 3.8  CL 102 96* 98 98  CO2 27 28 30 29   GLUCOSE 119* 109* 117* 107*  BUN 13 17 20 12   CREATININE 1.09 1.33* 1.27* 1.05  CALCIUM 8.6* 8.5* 8.1* 8.3*  MG 1.9 1.8  --  1.8    Liver Function Tests: No results for input(s): AST, ALT, ALKPHOS, BILITOT, PROT, ALBUMIN in the last 168 hours. No results for input(s):  LIPASE, AMYLASE in the last 168 hours. No results for input(s): AMMONIA in the last 168 hours.  CBC: Recent Labs  Lab 05/28/18 1731 05/29/18 0159 05/30/18 0500 05/31/18 0433 06/01/18 0536  WBC 8.3 9.4 10.1 10.5 11.8*  NEUTROABS 4.1  --   --   --   --   HGB 14.0 13.1 13.4 13.0 13.5  HCT 46.7 40.6 42.1 40.6 41.7  MCV 95.5 91.0 89.4 90.6 92.1  PLT 344 321 332 321 328    Cardiac Enzymes: No results for input(s): CKTOTAL, CKMB, CKMBINDEX, TROPONINI in the last 168 hours.  BNP: BNP (last 3 results) Recent Labs    05/28/18 1731 05/29/18 0159  BNP 931.2* 696.2*    ProBNP (last 3 results) No results for input(s): PROBNP in the last 8760 hours.    Other results:  Imaging: No results found.   Medications:     Scheduled Medications: . aspirin  81 mg Oral Daily  . buprenorphine-naloxone  1 tablet Sublingual BID  . chlordiazePOXIDE  5 mg Oral TID  . Chlorhexidine Gluconate Cloth  6 each Topical Daily  . Chlorhexidine Gluconate Cloth  6 each Topical Q0600  . digoxin  0.125 mg Oral Daily  . folic acid  1 mg Oral Daily  . furosemide  40 mg Oral Daily  . LORazepam  0-4 mg Intravenous Q12H  .  losartan  25 mg Oral Daily  . multivitamin with minerals  1 tablet Oral Daily  . mupirocin ointment  1 application Nasal BID  . nicotine  14 mg Transdermal Daily  . sodium chloride flush  10-40 mL Intracatheter Q12H  . sodium chloride flush  3 mL Intravenous Q12H  . sodium chloride flush  3 mL Intravenous Q12H  . spironolactone  12.5 mg Oral Daily  . thiamine  100 mg Oral Daily   Or  . thiamine  100 mg Intravenous Daily    Infusions: . sodium chloride    . sodium chloride    . sodium chloride    . sodium chloride    . amiodarone 30 mg/hr (06/01/18 0700)  . heparin 1,650 Units/hr (06/01/18 0700)  . milrinone 0.125 mcg/kg/min (06/01/18 0700)    PRN Medications: sodium chloride, acetaminophen, ipratropium-albuterol, LORazepam **OR** LORazepam, nitroGLYCERIN, ondansetron  (ZOFRAN) IV, sodium chloride flush, sodium chloride flush   Assessment:   Kenneth [name] is a 42 y.o. male with h/o substance abuse including amphetamines.   He presented to [facility] 05/28/18 with new acute systolic CHF in the setting of Afib RVR and substance abuse.    Plan/Discussion:     1. Acute systolic CHF -> cardiogenic shock - ECHO with biventricuala dysfunction. LVEF 20%.Likely in setting of chronic substance abuse and Afib RVR.  - Co-ox 59.5% on milrinone 0.125 mcg/kg/min.  - Volume status stable on po lasix 40 mg daily.  - Continue digoxin 0.125 mg daily. Check level next week.  - Increase spiro to 25 mg daily.  - Continue losartan 25 mg daily.  - Avoid BB for now with low output and hypotension - Continue heparin for now. Plan for [facility] this afternoon, then TEE/DCCV tomorrow.  - Pt is not a candidate for advanced therapies including transplant, LVAD, OR home inotrope support due to on-going substance abuse.   2. Afib with RVR - Continue IV amiodarone - CHA2DS2-VASc is at least 2 (CHF, HTN). Unknown vascular disease. - Continue heparin for now in case he needs support device - Will need TEE/DCCV Friday.  - Will need to decide on [facility]. Ideally Eliquis but may need to consider Xarelto (once daily) due to ? Compliance.   3. Substance abuse - UDS 05/28/18 positive for opiates, BZDs, amphetamines, and THC.  - [facility] managing CIWA protocol. Stable.    4. Hypomagnesemia - Mg 1.8. K 3.8. Supp Mg.   Plan for cath today and TEE/DCCV tomorrow.   Kenneth Freer, PA-C  06/01/2018, 7:19 AM  Advanced Heart Failure Team Pager [phone] (M-F; 7a - 4p)  Please contact [facility] for night-coverage after hours (4p -7a ) and weekends on [email or web address]  Remains on milrinone. Co-ox improved. Volume status looks good. Remains in AF on IV amio. On heparin. No bleeding. Mag low.   Continue milrinone. Switch heparin to Eliquis (if he can get 3 doses in beforehand). Plan cath  today and TEE/DCCV tomorrow.  Can got to [facility].  Kenneth Meres, MD  9:01 AM

## 2018-06-01 NOTE — Progress Notes (Signed)
ANTICOAGULATION CONSULT NOTE Pharmacy Consult for Heparin Indication: atrial fibrillation, new-onset  No Known Allergies  Patient Measurements: Height: 6\' 2"  (188 cm) Weight: 177 lb 4 oz (80.4 kg) IBW/kg (Calculated) : 82.2  Heparin dosing weight = actual weight 85.9 kg  Vital Signs: Temp: 98.3 F (36.8 C) (12/19 0750) Temp Source: Oral (12/19 0750) BP: 99/86 (12/19 0800) Pulse Rate: 114 (12/19 0800)  Labs: Recent Labs    05/30/18 0500  05/31/18 0043 05/31/18 0433 05/31/18 0858 06/01/18 0536 06/01/18 0918  HGB 13.4  --   --  13.0  --  13.5  --   HCT 42.1  --   --  40.6  --  41.7  --   PLT 332  --   --  321  --  328  --   LABPROT 14.8  --   --   --   --   --   --   INR 1.17  --   --   --   --   --   --   HEPARINUNFRC  --    < > 0.17*  --  0.34  --  0.63  CREATININE 1.33*  --   --  1.27*  --  1.05  --    < > = values in this interval not displayed.    Estimated Creatinine Clearance: 105.3 mL/min (by C-G formula based on SCr of 1.05 mg/dL).   Medical History: Past Medical History:  Diagnosis Date  . Frequent headaches   . Narcotic abuse (HCC)   . PTSD (post-traumatic stress disorder)    Assessment: 42 year old male admitted on 05/28/18 with SOB and weight gain, found to have new-onset Afib and was started on IV heparin on 12/15. Not on anticoagulation PTA.  Heparin level therapeutic at 0.63, CBC stable. Heart cath planned for today, will consider transition to DOAC after.  Goal of Therapy:  Heparin level 0.3-0.7 units/ml Monitor platelets by anticoagulation protocol: Yes   Plan:  -Continue heparin 1650 units/hr -Daily heparin level and CBC   Fredonia Highland, PharmD, BCPS Clinical Pharmacist [phone] Please check AMION for all [facility] Pharmacy numbers 06/01/2018

## 2018-06-01 NOTE — Progress Notes (Signed)
ANTICOAGULATION CONSULT NOTE Pharmacy Consult for Heparin Indication: atrial fibrillation, new-onset  No Known Allergies  Patient Measurements: Height: 6\' 2"  (188 cm) Weight: 177 lb 4 oz (80.4 kg) IBW/kg (Calculated) : 82.2  Heparin dosing weight = actual weight 85.9 kg  Vital Signs: Temp: 98.7 F (37.1 C) (12/19 1215) Temp Source: Oral (12/19 1215) BP: 132/88 (12/19 1827) Pulse Rate: 98 (12/19 1827)  Labs: Recent Labs    05/30/18 0500  05/31/18 0043 05/31/18 0433 05/31/18 0858 06/01/18 0536 06/01/18 0918  HGB 13.4  --   --  13.0  --  13.5  --   HCT 42.1  --   --  40.6  --  41.7  --   PLT 332  --   --  321  --  328  --   LABPROT 14.8  --   --   --   --   --   --   INR 1.17  --   --   --   --   --   --   HEPARINUNFRC  --    < > 0.17*  --  0.34  --  0.63  CREATININE 1.33*  --   --  1.27*  --  1.05  --    < > = values in this interval not displayed.    Estimated Creatinine Clearance: 105.3 mL/min (by C-G formula based on SCr of 1.05 mg/dL).   Medical History: Past Medical History:  Diagnosis Date  . Frequent headaches   . Narcotic abuse (HCC)   . PTSD (post-traumatic stress disorder)    Assessment: 42 year old male admitted on 05/28/18 with SOB and weight gain, found to have new-onset Afib and was started on IV heparin on 12/15. Not on anticoagulation PTA.  Heparin level therapeutic at 0.63, CBC stable. Heart cath planned for today, will consider transition to DOAC after.  S/p cath - pharmacy asked to resume heparin 6 hrs after sheath out (removed 1830 PM).  Planning DCCV tomorrow.  Goal of Therapy:  Heparin level 0.3-0.7 units/ml Monitor platelets by anticoagulation protocol: Yes   Plan:  -Restart heparin at 1650 units/hr at 0030 AM tomorrow. -Check heparin level 6 hrs after heparin starts. -Daily heparin level and CBC. -F/u plans to start DOAC after DCCV.  Jenetta Downer, [facility] Clinical Pharmacist Phone [phone]  06/01/2018 7:38  PM

## 2018-06-01 NOTE — H&P (View-Only) (Signed)
  Advanced Heart Failure Rounding Note   Subjective:    Coox 59.5% this am on milrinone 0.125 mcg/kg/min. CVP 7-8 cm Weight shows down 13 lbs from admit.    Feeling better today. Frustrated with line in his neck. Denies SOB, and no longer orthopneic.   Cr 1.33 -> 1.27 -> 1.05  Objective:   Weight Range:  Vital Signs:   Temp:  [98.1 F (36.7 C)-98.2 F (36.8 C)] 98.2 F (36.8 C) (12/19 0300) Pulse Rate:  [64-190] 96 (12/19 0700) Resp:  [11-27] 16 (12/19 0700) BP: (98-222)/(64-189) 111/90 (12/19 0700) SpO2:  [86 %-97 %] 94 % (12/19 0700) Weight:  [80.4 kg] 80.4 kg (12/19 0500) Last BM Date: 05/28/18  Weight change: Filed Weights   05/29/18 0556 05/31/18 0500 06/01/18 0500  Weight: 85.9 kg 84.6 kg 80.4 kg   Intake/Output:   Intake/Output Summary (Last 24 hours) at 06/01/2018 0719 Last data filed at 06/01/2018 0700 Gross per 24 hour  Intake 1085.21 ml  Output 2750 ml  Net -1664.79 ml   Physical Exam   General: NAD HEENT: Normal Neck: Supple. JVP 7-8. LIJ TLC. Carotids 2+ bilat; no bruits. No thyromegaly or nodule noted. Cor: PMI nondisplaced. Irregularly irregular.  Lungs: CTAB, normal effort. Abdomen: Soft, non-tender, non-distended, no HSM. No bruits or masses. +BS  Extremities: No cyanosis, clubbing, or rash. R and LLE no edema.  Neuro: Alert & orientedx3, cranial nerves grossly intact. moves all 4 extremities w/o difficulty. Affect pleasant   Telemetry   AF 90-100s, personally reviewed.   Labs    Basic Metabolic Panel: Recent Labs  Lab 05/28/18 1731 05/30/18 0500 05/31/18 0433 06/01/18 0536  NA 136 137 138 139  K 4.4 5.1 4.4 3.8  CL 102 96* 98 98  CO2 27 28 30 29  GLUCOSE 119* 109* 117* 107*  BUN 13 17 20 12  CREATININE 1.09 1.33* 1.27* 1.05  CALCIUM 8.6* 8.5* 8.1* 8.3*  MG 1.9 1.8  --  1.8    Liver Function Tests: No results for input(s): AST, ALT, ALKPHOS, BILITOT, PROT, ALBUMIN in the last 168 hours. No results for input(s):  LIPASE, AMYLASE in the last 168 hours. No results for input(s): AMMONIA in the last 168 hours.  CBC: Recent Labs  Lab 05/28/18 1731 05/29/18 0159 05/30/18 0500 05/31/18 0433 06/01/18 0536  WBC 8.3 9.4 10.1 10.5 11.8*  NEUTROABS 4.1  --   --   --   --   HGB 14.0 13.1 13.4 13.0 13.5  HCT 46.7 40.6 42.1 40.6 41.7  MCV 95.5 91.0 89.4 90.6 92.1  PLT 344 321 332 321 328    Cardiac Enzymes: No results for input(s): CKTOTAL, CKMB, CKMBINDEX, TROPONINI in the last 168 hours.  BNP: BNP (last 3 results) Recent Labs    05/28/18 1731 05/29/18 0159  BNP 931.2* 696.2*    ProBNP (last 3 results) No results for input(s): PROBNP in the last 8760 hours.    Other results:  Imaging: No results found.   Medications:     Scheduled Medications: . aspirin  81 mg Oral Daily  . buprenorphine-naloxone  1 tablet Sublingual BID  . chlordiazePOXIDE  5 mg Oral TID  . Chlorhexidine Gluconate Cloth  6 each Topical Daily  . Chlorhexidine Gluconate Cloth  6 each Topical Q0600  . digoxin  0.125 mg Oral Daily  . folic acid  1 mg Oral Daily  . furosemide  40 mg Oral Daily  . LORazepam  0-4 mg Intravenous Q12H  .   losartan  25 mg Oral Daily  . multivitamin with minerals  1 tablet Oral Daily  . mupirocin ointment  1 application Nasal BID  . nicotine  14 mg Transdermal Daily  . sodium chloride flush  10-40 mL Intracatheter Q12H  . sodium chloride flush  3 mL Intravenous Q12H  . sodium chloride flush  3 mL Intravenous Q12H  . spironolactone  12.5 mg Oral Daily  . thiamine  100 mg Oral Daily   Or  . thiamine  100 mg Intravenous Daily    Infusions: . sodium chloride    . sodium chloride    . sodium chloride    . sodium chloride    . amiodarone 30 mg/hr (06/01/18 0700)  . heparin 1,650 Units/hr (06/01/18 0700)  . milrinone 0.125 mcg/kg/min (06/01/18 0700)    PRN Medications: sodium chloride, acetaminophen, ipratropium-albuterol, LORazepam **OR** LORazepam, nitroGLYCERIN, ondansetron  (ZOFRAN) IV, sodium chloride flush, sodium chloride flush   Assessment:   Kenneth [name] is a 42 y.o. male with h/o substance abuse including amphetamines.   He presented to [facility]/28/18 with new acute systolic CHF in the setting of Afib RVR and substance abuse.    Plan/Discussion:     1. Acute systolic CHF -> cardiogenic shock - ECHO with biventricuala dysfunction. LVEF 20%.Likely in setting of chronic substance abuse and Afib RVR.  - Co-ox 59.5% on milrinone 0.125 mcg/kg/min.  - Volume status stable on po lasix 40 mg daily.  - Continue digoxin 0.125 mg daily. Check level next week.  - Increase spiro to 25 mg daily.  - Continue losartan 25 mg daily.  - Avoid BB for now with low output and hypotension - Continue heparin for now. Plan for [facility] this afternoon, then TEE/DCCV tomorrow.  - Pt is not a candidate for advanced therapies including transplant, LVAD, OR home inotrope support due to on-going substance abuse.   2. Afib with RVR - Continue IV amiodarone - CHA2DS2-VASc is at least 2 (CHF, HTN). Unknown vascular disease. - Continue heparin for now in case he needs support device - Will need TEE/DCCV Friday.  - Will need to decide on [facility]. Ideally Eliquis but may need to consider Xarelto (once daily) due to ? Compliance.   3. Substance abuse - UDS 05/28/18 positive for opiates, BZDs, amphetamines, and THC.  - [facility] managing CIWA protocol. Stable.    4. Hypomagnesemia - Mg 1.8. K 3.8. Supp Mg.   Plan for cath today and TEE/DCCV tomorrow.   Graciella Freer, PA-C  06/01/2018, 7:19 AM  Advanced Heart Failure Team Pager [phone] (M-F; 7a - 4p)  Please contact [facility] for night-coverage after hours (4p -7a ) and weekends on [email or web address]  Remains on milrinone. Co-ox improved. Volume status looks good. Remains in AF on IV amio. On heparin. No bleeding. Mag low.   Continue milrinone. Switch heparin to Eliquis (if he can get 3 doses in beforehand). Plan cath  today and TEE/DCCV tomorrow.  Can got to [facility].  Arvilla Meres, MD  9:01 AM

## 2018-06-01 NOTE — Progress Notes (Signed)
PROGRESS NOTE    Kenneth [name]  PYY:[id number] DOB: [birth year] DOA: 05/28/2018 PCP: Patient, No Pcp Per   Brief Narrative: 42 year old male with history of substance abuse including amphetamine/narcotic abuse, PTSD scented to ER with shortness of breath ongoing for past 1 week.  His symptoms are worse with exertion better at rest, also with substernal discomfort on extreme exertion and was sleeping sitting up for past 3 to 4 days and was having some leg swelling.  In the ER he was found to have new onset atrial fibrillation with RVR, pulmonary edema.  Patient was seen and admitted further work-up showed EF 20%, also monitoring here in withdrawal and became hypotensive and subsequently developed acute renal failure, was moved to stepdown/ICU. CHF/cardiology team is following along.   Assessment & Plan:   New onset a-fib with RVR : Patient remains on IV amiodarone, heparin drip.  [id number] score at least 2 given CHF and hypertension.  Cardiology following along, noted plan for cardiac cath today and possible TEE/DCCV tomorrow.May need Eliquis  Or once a day Xarelto due to compliance issues.  Acute systolic CHF with cardiogenic shock. LVEF 20% with WMA.  Likely from chronic substance abuse, complicated by A. fib RVR.  BNP 600-900 and admission. Remains on milrinone drip, Lasix.  Appreciate cardiology on board, continue digoxin daily, check level next week.  Increase Aldactone to 25 mg daily, continue losartan 25 mg. Avoiding beta-blocker due to low output and hypotension.  Acute hypoxic respiratory failure due to CHF: Due to wean down supplemental oxygen.  Overall stable.  Methamphetamine abuse/Alcohol abuse/marijuana abuse, tobacco dependence  : Positive for opiates, benzodiazepine, amphetamine and THC.  Patient is counseled regarding need to quit substance and alcohol.  Patient on Suboxone along with CIWA protocol and low-dose Librium to manage withdrawal.  Monitor and continue supportive care.   Continue thiamine/folate.  Acute renal failure : Due to combination of cardiogenic shock, IV drug abuse.  Creatinine down to 1.0 from 1.27 <--1.33 <- and 1.09.  On PO Lasix, monitor trend closely.  DVT prophylaxis: Heparin drip Code Status: Full code Family Communication: no Family at the bedside Disposition Plan: SDU    Consultants:  CHF-CARDIOLOGY Procedures:  Antimicrobials:  Subjective: Seen and examined this morning.  He was resting comfortably, woke up on calling, denies any complaint. Remains on amiodarone drip, heparin drip and milrinone drip. Denies nausea vomiting chest pain or shortness of breath.  Objective: Vitals:   06/01/18 0658 06/01/18 0700 06/01/18 0750 06/01/18 0800  BP:  111/90  99/86  Pulse: 74 96  (!) 114  Resp: 17 16  14   Temp:   98.3 F (36.8 C)   TempSrc:   Oral   SpO2: 92% 94%  92%  Weight:      Height:        Intake/Output Summary (Last 24 hours) at 06/01/2018 1148 Last data filed at 06/01/2018 0800 [name] per 24 hour  Intake 839.77 ml  Output 2750 ml  Net -1910.23 ml   Filed Weights   05/29/18 0556 05/31/18 0500 06/01/18 0500  Weight: 85.9 kg 84.6 kg 80.4 kg   Weight change: -4.2 kg  Body mass index is 22.76 kg/m.  Examination:  General exam: Calm, comfortable not in acute distress, average built.  HEENT:Oral mucosa moist, Ear/Nose WNL grossly Respiratory system: Bilateral equal air entry, mild basal crackles. Cardiovascular system: S1 & S2 heard, irregular irregular rate. No JVD/murmurs.No pedal edema. Gastrointestinal system: Abdomen soft, nontender non-distended, BS +. Nervous System:Alert/awake/oriented at baseline.  Able to move UE and LE Extremities: No edema,distal peripheral pulses palpable.  Monitoring device in the left ankle. Skin: No rashes,no icterus. MSK: Normal muscle bulk,tone ,power Data Reviewed: I have personally reviewed following labs and imaging studies  CBC: Recent Labs  Lab 05/28/18 1731  05/29/18 0159 05/30/18 0500 05/31/18 0433 06/01/18 0536  WBC 8.3 9.4 10.1 10.5 11.8*  NEUTROABS 4.1  --   --   --   --   HGB 14.0 13.1 13.4 13.0 13.5  HCT 46.7 40.6 42.1 40.6 41.7  MCV 95.5 91.0 89.4 90.6 92.1  PLT 344 321 332 321 328   Basic Metabolic Panel: Recent Labs  Lab 05/28/18 1731 05/30/18 0500 05/31/18 0433 06/01/18 0536  NA 136 137 138 139  K 4.4 5.1 4.4 3.8  CL 102 96* 98 98  CO2 27 28 30 29   GLUCOSE 119* 109* 117* 107*  BUN 13 17 20 12   CREATININE 1.09 1.33* 1.27* 1.05  CALCIUM 8.6* 8.5* 8.1* 8.3*  MG 1.9 1.8  --  1.8   GFR: Estimated Creatinine Clearance: 105.3 mL/min (by C-G formula based on SCr of 1.05 mg/dL). Liver Function Tests: No results for input(s): AST, ALT, ALKPHOS, BILITOT, PROT, ALBUMIN in the last 168 hours. No results for input(s): LIPASE, AMYLASE in the last 168 hours. No results for input(s): AMMONIA in the last 168 hours. Coagulation Profile: Recent Labs  Lab 05/30/18 0500  INR 1.17   Cardiac Enzymes: No results for input(s): CKTOTAL, CKMB, CKMBINDEX, TROPONINI in the last 168 hours. BNP (last 3 results) No results for input(s): PROBNP in the last 8760 hours. HbA1C: No results for input(s): HGBA1C in the last 72 hours. CBG: No results for input(s): GLUCAP in the last 168 hours. Lipid Profile: No results for input(s): CHOL, HDL, LDLCALC, TRIG, CHOLHDL, LDLDIRECT in the last 72 hours. Thyroid Function Tests: No results for input(s): TSH, T4TOTAL, FREET4, T3FREE, THYROIDAB in the last 72 hours. Anemia Panel: No results for input(s): VITAMINB12, FOLATE, FERRITIN, TIBC, IRON, RETICCTPCT in the last 72 hours. Sepsis Labs: Recent Labs  Lab 05/30/18 0101  LATICACIDVEN 1.1    Recent Results (from the past 240 hour(s))  MRSA PCR Screening     Status: Abnormal   Collection Time: 05/29/18  5:53 PM  Result Value Ref Range Status   MRSA by PCR POSITIVE (A) NEGATIVE Final    Comment:        The GeneXpert MRSA Assay (FDA approved  for NASAL specimens only), is one component of a comprehensive MRSA colonization surveillance program. It is not intended to diagnose MRSA infection nor to guide or monitor treatment for MRSA infections. RESULT CALLED TO, READ BACK BY AND VERIFIED WITH: Darl HouseholderS WELKER RN 05/29/18 2034 JDW Performed at [facility], [address] [place], [place] [address]       Radiology Studies: No results found.   Scheduled Meds: . aspirin  81 mg Oral Daily  . buprenorphine-naloxone  1 tablet Sublingual BID  . chlordiazePOXIDE  5 mg Oral TID  . Chlorhexidine Gluconate Cloth  6 each Topical Daily  . Chlorhexidine Gluconate Cloth  6 each Topical Q0600  . digoxin  0.125 mg Oral Daily  . folic acid  1 mg Oral Daily  . furosemide  40 mg Oral Daily  . LORazepam  0-4 mg Intravenous Q12H  . losartan  25 mg Oral Daily  . multivitamin with minerals  1 tablet Oral Daily  . mupirocin ointment  1 application Nasal BID  .  nicotine  14 mg Transdermal Daily  . sodium chloride flush  10-40 mL Intracatheter Q12H  . sodium chloride flush  3 mL Intravenous Q12H  . sodium chloride flush  3 mL Intravenous Q12H  . spironolactone  25 mg Oral Daily  . thiamine  100 mg Oral Daily   Or  . thiamine  100 mg Intravenous Daily   Continuous Infusions: . sodium chloride    . sodium chloride    . sodium chloride    . sodium chloride 10 mL/hr at 06/01/18 0913  . amiodarone 30 mg/hr (06/01/18 0800)  . heparin 1,650 Units/hr (06/01/18 0800)  . milrinone 0.125 mcg/kg/min (06/01/18 0947)     LOS: 3 days   Time spent: More than 50% of that time was spent in counseling and/or coordination of care.  Lanae Boast, MD [facility] Pager [phone]  If 7PM-7AM, please contact night-coverage [email or web address] Password [email or web address] 06/01/2018, 11:48 AM

## 2018-06-01 NOTE — Interval H&P Note (Signed)
History and Physical Interval Note:  06/01/2018 5:26 PM  Kenneth [name]  has presented today for surgery, with the diagnosis of hf  The various methods of treatment have been discussed with the patient and family. After consideration of risks, benefits and other options for treatment, the patient has consented to  Procedure(s): RIGHT/LEFT HEART CATH AND CORONARY ANGIOGRAPHY (N/A) and possible coronary angioplasty as a surgical intervention .  The patient's history has been reviewed, patient examined, no change in status, stable for surgery.  I have reviewed the patient's chart and labs.  Questions were answered to the patient's satisfaction.     Azjah Pardo

## 2018-06-02 ENCOUNTER — Inpatient Hospital Stay (HOSPITAL_COMMUNITY): Payer: BLUE CROSS/BLUE SHIELD | Admitting: Anesthesiology

## 2018-06-02 ENCOUNTER — Encounter (HOSPITAL_COMMUNITY): Payer: Self-pay | Admitting: Internal Medicine

## 2018-06-02 ENCOUNTER — Inpatient Hospital Stay (HOSPITAL_COMMUNITY): Payer: BLUE CROSS/BLUE SHIELD

## 2018-06-02 ENCOUNTER — Encounter (HOSPITAL_COMMUNITY)
Admission: EM | Disposition: A | Discharge status: Home / Self Care | Payer: Self-pay | Source: Home / Self Care | Attending: Internal Medicine

## 2018-06-02 DIAGNOSIS — I340 Nonrheumatic mitral (valve) insufficiency: Secondary | ICD-10-CM

## 2018-06-02 HISTORY — PX: TEE WITHOUT CARDIOVERSION: SHX5443

## 2018-06-02 HISTORY — PX: CARDIOVERSION: SHX1299

## 2018-06-02 LAB — BASIC METABOLIC PANEL
Anion gap: 12 (ref 5–15)
BUN: 11 mg/dL (ref 6–20)
CO2: 31 mmol/L (ref 22–32)
Calcium: 8.5 mg/dL — ABNORMAL LOW (ref 8.9–10.3)
Chloride: 96 mmol/L — ABNORMAL LOW (ref 98–111)
Creatinine, Ser: 1.11 mg/dL (ref 0.61–1.24)
GFR calc Af Amer: >60 mL/min (ref >60)
GFR calc non Af Amer: >60 mL/min (ref >60)
Glucose, Bld: 118 mg/dL — ABNORMAL HIGH (ref 70–99)
Potassium: 3.7 mmol/L (ref 3.5–5.1)
Sodium: 139 mmol/L (ref 135–145)

## 2018-06-02 LAB — HEPATIC FUNCTION PANEL
ALK PHOS: 73 U/L (ref 38–126)
ALT: 29 U/L (ref 0–44)
AST: 45 U/L — ABNORMAL HIGH (ref 15–41)
Albumin: 3 g/dL — ABNORMAL LOW (ref 3.5–5.0)
BILIRUBIN INDIRECT: 0.6 mg/dL (ref 0.3–0.9)
Bilirubin, Direct: 0.3 mg/dL — ABNORMAL HIGH (ref 0.0–0.2)
Total Bilirubin: 0.9 mg/dL (ref 0.3–1.2)
Total Protein: 5.8 g/dL — ABNORMAL LOW (ref 6.5–8.1)

## 2018-06-02 LAB — COOXEMETRY PANEL
Carboxyhemoglobin: 1.1 % (ref 0.5–1.5)
Methemoglobin: 1.6 % — ABNORMAL HIGH (ref 0.0–1.5)
O2 Saturation: 66.8 %
TOTAL HEMOGLOBIN: 14.7 g/dL (ref 12.0–16.0)

## 2018-06-02 LAB — CBC
HCT: 43 % (ref 39.0–52.0)
Hemoglobin: 13.7 g/dL (ref 13.0–17.0)
MCH: 29.1 pg (ref 26.0–34.0)
MCHC: 31.9 g/dL (ref 30.0–36.0)
MCV: 91.5 fL (ref 80.0–100.0)
Platelets: 316 10*3/uL (ref 150–400)
RBC: 4.7 MIL/uL (ref 4.22–5.81)
RDW: 14.2 % (ref 11.5–15.5)
WBC: 8.3 10*3/uL (ref 4.0–10.5)
nRBC: 0 % (ref 0.0–0.2)

## 2018-06-02 LAB — HEPARIN LEVEL (UNFRACTIONATED): Heparin Unfractionated: 0.46 IU/mL (ref 0.30–0.70)

## 2018-06-02 SURGERY — ECHOCARDIOGRAM, TRANSESOPHAGEAL
Anesthesia: General

## 2018-06-02 MED ORDER — CHLORDIAZEPOXIDE HCL 5 MG PO CAPS
5.0000 mg | ORAL_CAPSULE | Freq: Three times a day (TID) | ORAL | Status: DC
  Administered 2018-06-02 – 2018-06-03 (×3): 5 mg via ORAL
  Filled 2018-06-02 (×3): qty 1

## 2018-06-02 MED ORDER — PROPOFOL 10 MG/ML IV BOLUS
INTRAVENOUS | Status: DC | PRN
  Administered 2018-06-02: 40 mg via INTRAVENOUS

## 2018-06-02 MED ORDER — ADULT MULTIVITAMIN W/MINERALS CH
1.0000 | ORAL_TABLET | Freq: Every day | ORAL | Status: DC
  Administered 2018-06-02 – 2018-06-04 (×3): 1 via ORAL
  Filled 2018-06-02 (×3): qty 1

## 2018-06-02 MED ORDER — FOLIC ACID 1 MG PO TABS
1.0000 mg | ORAL_TABLET | Freq: Every day | ORAL | Status: DC
  Administered 2018-06-02 – 2018-06-04 (×3): 1 mg via ORAL
  Filled 2018-06-02 (×3): qty 1

## 2018-06-02 MED ORDER — THIAMINE HCL 100 MG/ML IJ SOLN: 100.0000 mg | Freq: Every day | INTRAMUSCULAR | Status: DC

## 2018-06-02 MED ORDER — SPIRONOLACTONE 25 MG PO TABS
25.0000 mg | ORAL_TABLET | Freq: Every day | ORAL | Status: DC
  Administered 2018-06-02 – 2018-06-04 (×3): 25 mg via ORAL
  Filled 2018-06-02 (×3): qty 1

## 2018-06-02 MED ORDER — APIXABAN 5 MG PO TABS
10.0000 mg | ORAL_TABLET | ORAL | Status: AC
  Administered 2018-06-02: 10 mg via ORAL
  Filled 2018-06-02: qty 2

## 2018-06-02 MED ORDER — BUTAMBEN-TETRACAINE-BENZOCAINE 2-2-14 % EX AERO
INHALATION_SPRAY | CUTANEOUS | Status: DC | PRN
  Administered 2018-06-02: 2 via TOPICAL

## 2018-06-02 MED ORDER — FUROSEMIDE 40 MG PO TABS
40.0000 mg | ORAL_TABLET | Freq: Every day | ORAL | Status: DC
  Administered 2018-06-02 – 2018-06-04 (×3): 40 mg via ORAL
  Filled 2018-06-02 (×3): qty 1

## 2018-06-02 MED ORDER — VITAMIN B-1 100 MG PO TABS
100.0000 mg | ORAL_TABLET | Freq: Every day | ORAL | Status: DC
  Administered 2018-06-02 – 2018-06-04 (×3): 100 mg via ORAL
  Filled 2018-06-02 (×3): qty 1

## 2018-06-02 MED ORDER — PHENYLEPHRINE HCL 10 MG/ML IJ SOLN
INTRAMUSCULAR | Status: DC | PRN
  Administered 2018-06-02 (×2): 80 ug via INTRAVENOUS

## 2018-06-02 MED ORDER — APIXABAN 5 MG PO TABS
5.0000 mg | ORAL_TABLET | Freq: Two times a day (BID) | ORAL | Status: DC
  Administered 2018-06-02 – 2018-06-04 (×4): 5 mg via ORAL
  Filled 2018-06-02 (×4): qty 1

## 2018-06-02 MED ORDER — PROPOFOL 500 MG/50ML IV EMUL
INTRAVENOUS | Status: DC | PRN
  Administered 2018-06-02: 100 ug/kg/min via INTRAVENOUS

## 2018-06-02 MED ORDER — LOSARTAN POTASSIUM 25 MG PO TABS
25.0000 mg | ORAL_TABLET | Freq: Every day | ORAL | Status: DC
  Administered 2018-06-02 – 2018-06-04 (×3): 25 mg via ORAL
  Filled 2018-06-02 (×3): qty 1

## 2018-06-02 MED ORDER — DIGOXIN 125 MCG PO TABS
0.1250 mg | ORAL_TABLET | Freq: Every day | ORAL | Status: DC
  Administered 2018-06-02 – 2018-06-04 (×3): 0.125 mg via ORAL
  Filled 2018-06-02 (×3): qty 1

## 2018-06-02 NOTE — Anesthesia Preprocedure Evaluation (Addendum)
Anesthesia Evaluation  Patient identified by MRN, date of birth, ID band Patient awake    History of Anesthesia Complications (+) history of anesthetic complications  Airway Mallampati: II  TM Distance: >3 FB Neck ROM: Full    Dental  (+) Partial Upper, Poor Dentition, Missing   Pulmonary Current Smoker,    breath sounds clear to auscultation- rhonchi       Cardiovascular +CHF and + DOE  + dysrhythmias Atrial Fibrillation  Rhythm:Irregular     Neuro/Psych  Headaches, PSYCHIATRIC DISORDERS Anxiety    GI/Hepatic negative GI ROS, (+)     substance abuse  marijuana use and methamphetamine use,   Endo/Other  negative endocrine ROS  Renal/GU negative Renal ROS     Musculoskeletal  (+) narcotic dependent  Abdominal   Peds  Hematology negative hematology ROS (+)   Anesthesia Other Findings   Reproductive/Obstetrics                            Anesthesia Physical Anesthesia Plan  ASA: III  Anesthesia Plan: MAC and General   Post-op Pain Management:    Induction: Intravenous  PONV Risk Score and Plan: 1 and Treatment may vary due to age or medical condition  Airway Management Planned: Nasal Cannula  Additional Equipment: None  Intra-op Plan:   Post-operative Plan:   Informed Consent: I have reviewed the patients History and Physical, chart, labs and discussed the procedure including the risks, benefits and alternatives for the proposed anesthesia with the patient or authorized representative who has indicated his/her understanding and acceptance.   Dental advisory given  Plan Discussed with: CRNA and Surgeon  Anesthesia Plan Comments:         Anesthesia Quick Evaluation

## 2018-06-02 NOTE — Discharge Instructions (Signed)

## 2018-06-02 NOTE — Progress Notes (Signed)
ANTICOAGULATION CONSULT NOTE Pharmacy Consult for Heparin Indication: atrial fibrillation, new-onset  No Known Allergies  Patient Measurements: Height: 6\' 2"  (188 cm) Weight: 175 lb 7.8 oz (79.6 kg) IBW/kg (Calculated) : 82.2  Heparin dosing weight = actual weight 85.9 kg  Vital Signs: Temp: 97.7 F (36.5 C) (12/20 0000) Temp Source: Oral (12/20 0000) BP: 91/59 (12/20 0600) Pulse Rate: 78 (12/20 0600)  Labs: Recent Labs    05/31/18 0433 05/31/18 0858 06/01/18 0536 06/01/18 0918 06/02/18 0338 06/02/18 0617  HGB 13.0  --  13.5  --  13.7  --   HCT 40.6  --  41.7  --  43.0  --   PLT 321  --  328  --  316  --   HEPARINUNFRC  --  0.34  --  0.63  --  0.46  CREATININE 1.27*  --  1.05  --  1.11  --     Estimated Creatinine Clearance: 98.6 mL/min (by C-G formula based on SCr of 1.11 mg/dL).   Medical History: Past Medical History:  Diagnosis Date  . Frequent headaches   . Narcotic abuse (HCC)   . PTSD (post-traumatic stress disorder)    Assessment: 42 year old male admitted on 05/28/18 with SOB and weight gain, found to have new-onset Afib and was started on IV heparin on 12/15. Not on anticoagulation PTA.  Heparin level therapeutic at 0.63, CBC stable. Heart cath planned for today, will consider transition to DOAC after.  S/p cath - pharmacy asked to resume heparin 6 hrs after sheath out (removed 1830 PM).  Planning DCCV tomorrow.  12/20 AM update: heparin level therapeutic x 1 after re-start s/p cath  Goal of Therapy:  Heparin level 0.3-0.7 units/ml Monitor platelets by anticoagulation protocol: Yes   Plan:  -Cont heparin 1650 units/hr>>1200 HL -Daily heparin level and CBC. -F/u plans to start DOAC after DCCV.  Abran Duke, PharmD, BCPS Clinical Pharmacist Phone: [phone]

## 2018-06-02 NOTE — Progress Notes (Addendum)
Advanced Heart Failure Rounding Note   Subjective:    Coox 66.8% this am on milrinone 0.125 mcg/kg/min. CVP ~5-6. Weight down 15 lbs from admit (2 lbs overnight)   Feeling OK this am. Wants to eat. No questions about TEE/DCCV. Denies SOB, lightheadedness or dizziness. . Cath yesterday with No CAD and optimized hemodynamics on low dose milrinone  Cr 1.33 -> 1.27 -> 1.05 -> 1.11  L/RHC 06/01/18 - Normal coronaries RA = 4 RV = 30/3 PA = 31/16 (21) PCW = 14 Fick cardiac output/index = 5.74/2.8 PVR = 1.2 WU Aosat = 95% PA sat = 70%, 73% SVC = 69%  Assessment: 1. Normal coronaries 2. Severe NICM EF 20% 3. Well compensated hemodynamics on milrinone 0.125 mc/kg/min  Objective:   Weight Range:  Vital Signs:   Temp:  [97.7 F (36.5 C)-98.8 F (37.1 C)] 97.7 F (36.5 C) (12/20 0000) Pulse Rate:  [68-183] 78 (12/20 0600) Resp:  [9-20] 10 (12/20 0600) BP: (91-138)/(59-119) 91/59 (12/20 0600) SpO2:  [89 %-99 %] 94 % (12/20 0600) Weight:  [79.6 kg] 79.6 kg (12/20 0434) Last BM Date: 05/31/18  Weight change: Filed Weights   05/31/18 0500 06/01/18 0500 06/02/18 0434  Weight: 84.6 kg 80.4 kg 79.6 kg   Intake/Output:   Intake/Output Summary (Last 24 hours) at 06/02/2018 0719 Last data filed at 06/02/2018 0700 Gross per 24 hour  Intake 923.61 ml  Output -  Net 923.61 ml   Physical Exam   General: NAD  HEENT: Normal Neck: Supple. JVP not elevated. LIJ TLC Carotids 2+ bilat; no bruits. No thyromegaly or nodule noted. Cor: PMI nondisplaced. RRR, Irregularly irregular Lungs: CTAB, normal effort. Abdomen: Soft, non-tender, non-distended, no HSM. No bruits or masses. +BS  Extremities: No cyanosis, clubbing, or rash. R and LLE no edema.  Neuro: Alert & orientedx3, cranial nerves grossly intact. moves all 4 extremities w/o difficulty. Affect pleasant   Telemetry   AF 80-90s, personally reviewed.   Labs    Basic Metabolic Panel: Recent Labs  Lab 05/28/18 1731  05/30/18 0500 05/31/18 0433 06/01/18 0536 06/02/18 0338  NA 136 137 138 139 139  K 4.4 5.1 4.4 3.8 3.7  CL 102 96* 98 98 96*  CO2 27 28 30 29 31   GLUCOSE 119* 109* 117* 107* 118*  BUN 13 17 20 12 11   CREATININE 1.09 1.33* 1.27* 1.05 1.11  CALCIUM 8.6* 8.5* 8.1* 8.3* 8.5*  MG 1.9 1.8  --  1.8  --     Liver Function Tests: Recent Labs  Lab 06/02/18 0338  AST 45*  ALT 29  ALKPHOS 73  BILITOT 0.9  PROT 5.8*  ALBUMIN 3.0*   No results for input(s): LIPASE, AMYLASE in the last 168 hours. No results for input(s): AMMONIA in the last 168 hours.  CBC: Recent Labs  Lab 05/28/18 1731 05/29/18 0159 05/30/18 0500 05/31/18 0433 06/01/18 0536 06/02/18 0338  WBC 8.3 9.4 10.1 10.5 11.8* 8.3  NEUTROABS 4.1  --   --   --   --   --   HGB 14.0 13.1 13.4 13.0 13.5 13.7  HCT 46.7 40.6 42.1 40.6 41.7 43.0  MCV 95.5 91.0 89.4 90.6 92.1 91.5  PLT 344 321 332 321 328 316    Cardiac Enzymes: No results for input(s): CKTOTAL, CKMB, CKMBINDEX, TROPONINI in the last 168 hours.  BNP: BNP (last 3 results) Recent Labs    05/28/18 1731 05/29/18 0159  BNP 931.2* 696.2*    ProBNP (last 3 results)  No results for input(s): PROBNP in the last 8760 hours.    Other results:  Imaging: No results found.   Medications:     Scheduled Medications: . aspirin  81 mg Oral Daily  . buprenorphine-naloxone  1 tablet Sublingual BID  . chlordiazePOXIDE  5 mg Oral TID  . Chlorhexidine Gluconate Cloth  6 each Topical Daily  . Chlorhexidine Gluconate Cloth  6 each Topical Q0600  . digoxin  0.125 mg Oral Daily  . folic acid  1 mg Oral Daily  . furosemide  40 mg Oral Daily  . LORazepam  0-4 mg Intravenous Q12H  . losartan  25 mg Oral Daily  . multivitamin with minerals  1 tablet Oral Daily  . mupirocin ointment  1 application Nasal BID  . nicotine  14 mg Transdermal Daily  . sodium chloride flush  10-40 mL Intracatheter Q12H  . sodium chloride flush  3 mL Intravenous Q12H  . sodium  chloride flush  3 mL Intravenous Q12H  . spironolactone  25 mg Oral Daily  . thiamine  100 mg Oral Daily   Or  . thiamine  100 mg Intravenous Daily    Infusions: . sodium chloride 10 mL/hr at 06/02/18 0700  . sodium chloride    . sodium chloride    . amiodarone 30 mg/hr (06/02/18 0700)  . heparin 1,650 Units/hr (06/02/18 0700)  . milrinone 0.125 mcg/kg/min (06/02/18 0700)    PRN Medications: sodium chloride, acetaminophen, ipratropium-albuterol, LORazepam, nitroGLYCERIN, ondansetron (ZOFRAN) IV, sodium chloride flush, sodium chloride flush   Assessment:   Nickola MajorJason Erik Bucklin is a 42 y.o. male with h/o substance abuse including amphetamines.   [facility] with new acute systolic CHF in the setting of Afib RVR and substance abuse.   Plan/Discussion:    1. Acute systolic CHF -> cardiogenic shock - ECHO with biventricular dysfunction. LVEF 20%.Likely in setting of chronic substance abuse and Afib RVR.  - NICM by cath 06/01/18. Compensated hemodynamics on low dose milrinone. - Co-ox 66.8% on milrinone 0.125 mcg/kg/min. Will plan wean post DCCV.  - Volume status stable on po lasix 40 mg daily.  - Continue digoxin 0.125 mg daily. Check level after 7-10 days (next week) - Continue spiro 25 mg daily.  - Continue losartan 25 mg daily.  - Avoid BB for now with low output and hypotension - Plan for switch to Eliquis from coumadin for home.  - Pt is not a candidate for advanced therapies including transplant, LVAD, OR home inotrope support due to on-going substance abuse.   2. Afib with RVR - Continue IV amiodarone - CHA2DS2-VASc is at least 2 (CHF, HTN). Unknown vascular disease. - Continue heparin. Start Eliquis.  - Plan TEE/DCCV Friday.   3. Substance abuse - UDS 05/28/18 positive for opiates, BZDs, amphetamines, and THC.  - [facility] managing CIWA protocol. Stable.   4. Hypomagnesemia - Mg supped 06/01/18. K 3.7.   Plan TEE/DCCV today followed by milrinone  wean.   Graciella FreerMichael Andrew Tillery, PA-C  06/02/2018, 7:19 AM  Advanced Heart Failure Team Pager [phone] (M-F; 7a - 4p)  Please contact [facility] for night-coverage after hours (4p -7a ) and weekends on [email or web address]  Patient seen and examined with the above-signed Advanced Practice Provider and/or Housestaff. I personally reviewed laboratory data, imaging studies and relevant notes. I independently examined the patient and formulated the important aspects of the plan. I have edited the note to reflect any of my changes or salient points. I have personally  discussed the plan with the patient and/or family.  Remains on milrinone and IV amio. Cath results from yesterday reviewed with him. Remains in AF. Rate now controlled. On heparin. Will need TEE/DC-CV later today. Anticoagulation d/w PharmD. Will plan to switch to Eliquis this am using 10mg  initial dose. Supp K.  After DC-CV will begin milrinone wean tomorrow. I am hopeful that EF will recover with restoration of NSR but may also have CM related to substance abuse.   Can move to 2C.  Arvilla Meres, MD  8:06 AM

## 2018-06-02 NOTE — H&P (View-Only) (Signed)
  Advanced Heart Failure Rounding Note   Subjective:    Coox 66.8% this am on milrinone 0.125 mcg/kg/min. CVP ~5-6. Weight down 15 lbs from admit (2 lbs overnight)   Feeling OK this am. Wants to eat. No questions about TEE/DCCV. Denies SOB, lightheadedness or dizziness. . Cath yesterday with No CAD and optimized hemodynamics on low dose milrinone  Cr 1.33 -> 1.27 -> 1.05 -> 1.11  L/RHC 06/01/18 - Normal coronaries RA = 4 RV = 30/3 PA = 31/16 (21) PCW = 14 Fick cardiac output/index = 5.74/2.8 PVR = 1.2 WU Aosat = 95% PA sat = 70%, 73% SVC = 69%  Assessment: 1. Normal coronaries 2. Severe NICM EF 20% 3. Well compensated hemodynamics on milrinone 0.125 mc/kg/min  Objective:   Weight Range:  Vital Signs:   Temp:  [97.7 F (36.5 C)-98.8 F (37.1 C)] 97.7 F (36.5 C) (12/20 0000) Pulse Rate:  [68-183] 78 (12/20 0600) Resp:  [9-20] 10 (12/20 0600) BP: (91-138)/(59-119) 91/59 (12/20 0600) SpO2:  [89 %-99 %] 94 % (12/20 0600) Weight:  [79.6 kg] 79.6 kg (12/20 0434) Last BM Date: 05/31/18  Weight change: Filed Weights   05/31/18 0500 06/01/18 0500 06/02/18 0434  Weight: 84.6 kg 80.4 kg 79.6 kg   Intake/Output:   Intake/Output Summary (Last 24 hours) at 06/02/2018 0719 Last data filed at 06/02/2018 0700 Gross per 24 hour  Intake 923.61 ml  Output -  Net 923.61 ml   Physical Exam   General: NAD  HEENT: Normal Neck: Supple. JVP not elevated. LIJ TLC Carotids 2+ bilat; no bruits. No thyromegaly or nodule noted. Cor: PMI nondisplaced. RRR, Irregularly irregular Lungs: CTAB, normal effort. Abdomen: Soft, non-tender, non-distended, no HSM. No bruits or masses. +BS  Extremities: No cyanosis, clubbing, or rash. R and LLE no edema.  Neuro: Alert & orientedx3, cranial nerves grossly intact. moves all 4 extremities w/o difficulty. Affect pleasant   Telemetry   AF 80-90s, personally reviewed.   Labs    Basic Metabolic Panel: Recent Labs  Lab 05/28/18 1731  05/30/18 0500 05/31/18 0433 06/01/18 0536 06/02/18 0338  NA 136 137 138 139 139  K 4.4 5.1 4.4 3.8 3.7  CL 102 96* 98 98 96*  CO2 27 28 30 29 31  GLUCOSE 119* 109* 117* 107* 118*  BUN 13 17 20 12 11  CREATININE 1.09 1.33* 1.27* 1.05 1.11  CALCIUM 8.6* 8.5* 8.1* 8.3* 8.5*  MG 1.9 1.8  --  1.8  --     Liver Function Tests: Recent Labs  Lab 06/02/18 0338  AST 45*  ALT 29  ALKPHOS 73  BILITOT 0.9  PROT 5.8*  ALBUMIN 3.0*   No results for input(s): LIPASE, AMYLASE in the last 168 hours. No results for input(s): AMMONIA in the last 168 hours.  CBC: Recent Labs  Lab 05/28/18 1731 05/29/18 0159 05/30/18 0500 05/31/18 0433 06/01/18 0536 06/02/18 0338  WBC 8.3 9.4 10.1 10.5 11.8* 8.3  NEUTROABS 4.1  --   --   --   --   --   HGB 14.0 13.1 13.4 13.0 13.5 13.7  HCT 46.7 40.6 42.1 40.6 41.7 43.0  MCV 95.5 91.0 89.4 90.6 92.1 91.5  PLT 344 321 332 321 328 316    Cardiac Enzymes: No results for input(s): CKTOTAL, CKMB, CKMBINDEX, TROPONINI in the last 168 hours.  BNP: BNP (last 3 results) Recent Labs    05/28/18 1731 05/29/18 0159  BNP 931.2* 696.2*    ProBNP (last 3 results)   No results for input(s): PROBNP in the last 8760 hours.    Other results:  Imaging: No results found.   Medications:     Scheduled Medications: . aspirin  81 mg Oral Daily  . buprenorphine-naloxone  1 tablet Sublingual BID  . chlordiazePOXIDE  5 mg Oral TID  . Chlorhexidine Gluconate Cloth  6 each Topical Daily  . Chlorhexidine Gluconate Cloth  6 each Topical Q0600  . digoxin  0.125 mg Oral Daily  . folic acid  1 mg Oral Daily  . furosemide  40 mg Oral Daily  . LORazepam  0-4 mg Intravenous Q12H  . losartan  25 mg Oral Daily  . multivitamin with minerals  1 tablet Oral Daily  . mupirocin ointment  1 application Nasal BID  . nicotine  14 mg Transdermal Daily  . sodium chloride flush  10-40 mL Intracatheter Q12H  . sodium chloride flush  3 mL Intravenous Q12H  . sodium  chloride flush  3 mL Intravenous Q12H  . spironolactone  25 mg Oral Daily  . thiamine  100 mg Oral Daily   Or  . thiamine  100 mg Intravenous Daily    Infusions: . sodium chloride 10 mL/hr at 06/02/18 0700  . sodium chloride    . sodium chloride    . amiodarone 30 mg/hr (06/02/18 0700)  . heparin 1,650 Units/hr (06/02/18 0700)  . milrinone 0.125 mcg/kg/min (06/02/18 0700)    PRN Medications: sodium chloride, acetaminophen, ipratropium-albuterol, LORazepam, nitroGLYCERIN, ondansetron (ZOFRAN) IV, sodium chloride flush, sodium chloride flush   Assessment:   Kenneth [name] is a 42 y.o. male with h/o substance abuse including amphetamines.   He presented to [facility]/18 with new acute systolic CHF in the setting of Afib RVR and substance abuse.   Plan/Discussion:    1. Acute systolic CHF -> cardiogenic shock - ECHO with biventricular dysfunction. LVEF 20%.Likely in setting of chronic substance abuse and Afib RVR.  - NICM by cath 06/01/18. Compensated hemodynamics on low dose milrinone. - Co-ox 66.8% on milrinone 0.125 mcg/kg/min. Will plan wean post DCCV.  - Volume status stable on po lasix 40 mg daily.  - Continue digoxin 0.125 mg daily. Check level after 7-10 days (next week) - Continue spiro 25 mg daily.  - Continue losartan 25 mg daily.  - Avoid BB for now with low output and hypotension - Plan for switch to Eliquis from coumadin for home.  - Pt is not a candidate for advanced therapies including transplant, LVAD, OR home inotrope support due to on-going substance abuse.   2. Afib with RVR - Continue IV amiodarone - CHA2DS2-VASc is at least 2 (CHF, HTN). Unknown vascular disease. - Continue heparin. Start Eliquis.  - Plan TEE/DCCV Friday.   3. Substance abuse - UDS 05/28/18 positive for opiates, BZDs, amphetamines, and THC.  - [facility] managing CIWA protocol. Stable.   4. Hypomagnesemia - Mg supped 06/01/18. K 3.7.   Plan TEE/DCCV today followed by milrinone  wean.   Kenneth Andrew Tillery, PA-C  06/02/2018, 7:19 AM  Advanced Heart Failure Team Pager [phone] (M-F; 7a - 4p)  Please contact [facility] for night-coverage after hours (4p -7a ) and weekends on [email or web address]  Patient seen and examined with the above-signed Advanced Practice Provider and/or Housestaff. I personally reviewed laboratory data, imaging studies and relevant notes. I independently examined the patient and formulated the important aspects of the plan. I have edited the note to reflect any of my changes or salient points. I have personally   discussed the plan with the patient and/or family.  Remains on milrinone and IV amio. Cath results from yesterday reviewed with him. Remains in AF. Rate now controlled. On heparin. Will need TEE/DC-CV later today. Anticoagulation d/w PharmD. Will plan to switch to Eliquis this am using 10mg initial dose. Supp K.  After DC-CV will begin milrinone wean tomorrow. I am hopeful that EF will recover with restoration of NSR but may also have CM related to substance abuse.   Can move to 2C.  Orlander Norwood, MD  8:06 AM    

## 2018-06-02 NOTE — Anesthesia Procedure Notes (Signed)
Procedure Name: MAC Date/Time: 06/02/2018 12:31 PM Performed by: Laretta Alstrom, CRNA Pre-anesthesia Checklist: Patient identified, Emergency Drugs available, Suction available and Patient being monitored Patient Re-evaluated:Patient Re-evaluated prior to induction Oxygen Delivery Method: Nasal cannula

## 2018-06-02 NOTE — Progress Notes (Signed)
ANTICOAGULATION CONSULT NOTE Pharmacy Consult for Heparin > apixaban  Indication: atrial fibrillation, new-onset  No Known Allergies  Patient Measurements: Height: 6\' 2"  (188 cm) Weight: 175 lb 7.8 oz (79.6 kg) IBW/kg (Calculated) : 82.2  Heparin dosing weight = actual weight 85.9 kg  Vital Signs: Temp: 97.8 F (36.6 C) (12/20 1259) Temp Source: Oral (12/20 1259) BP: 96/63 (12/20 1310) Pulse Rate: 81 (12/20 1310)  Labs: Recent Labs    05/31/18 0433 05/31/18 0858 06/01/18 0536 06/01/18 0918 06/02/18 0338 06/02/18 0617  HGB 13.0  --  13.5  --  13.7  --   HCT 40.6  --  41.7  --  43.0  --   PLT 321  --  328  --  316  --   HEPARINUNFRC  --  0.34  --  0.63  --  0.46  CREATININE 1.27*  --  1.05  --  1.11  --     Estimated Creatinine Clearance: 98.6 mL/min (by C-G formula based on SCr of 1.11 mg/dL).   Medical History: Past Medical History:  Diagnosis Date  . Frequent headaches   . Narcotic abuse (HCC)   . PTSD (post-traumatic stress disorder)    Assessment: 42 year old male admitted on 05/28/18 with SOB and weight gain, found to have new-onset Afib and was started on IV heparin on 12/15. Not on anticoagulation PTA.  Heparin level therapeutic at 0.63, CBC stable s/p heart cath 12/19 last afternoon and heaprin restarted for Afib and planned TEE/DCCV 12/20.  Per [place] trial  TEE/DCCV with apixaban 1 arm 10mg  x1 2hr prior to TEE/DCCV   Goal of Therapy:  Heparin level 0.3-0.7 units/ml Monitor platelets by anticoagulation protocol: Yes   Plan:  Apixaban 10mg  x1 then 5mg  BID  Stop heparin    Leota Sauers Pharm.D. CPP, BCPS Clinical Pharmacist [phone] 06/02/2018 1:28 PM

## 2018-06-02 NOTE — Progress Notes (Signed)
  Echocardiogram Echocardiogram Transesophageal has been performed.  Tye Savoy 06/02/2018, 1:00 PM

## 2018-06-02 NOTE — CV Procedure (Signed)
   TRANSESOPHAGEAL ECHOCARDIOGRAM GUIDED DIRECT CURRENT CARDIOVERSION  NAME:  Kenneth [name]   MRN: [id number] DOB:  1975-06-15   ADMIT DATE: 05/28/2018  INDICATIONS:  Atrial fibrillation  PROCEDURE:   Informed consent was obtained prior to the procedure. The risks, benefits and alternatives for the procedure were discussed and the patient comprehended these risks.  Risks include, but are not limited to, cough, sore throat, vomiting, nausea, somnolence, esophageal and stomach trauma or perforation, bleeding, low blood pressure, aspiration, pneumonia, infection, trauma to the teeth and death.    After a procedural time-out, the oropharynx was anesthetized and the patient was sedated by the anesthesia service. The transesophageal probe was inserted in the esophagus and stomach without difficulty and multiple views were obtained.   FINDINGS:  LEFT VENTRICLE: EF = Dilated. EF 20% Global HK  RIGHT VENTRICLE:  Severe global HK  LEFT ATRIUM: Dialted  LEFT ATRIAL APPENDAGE: + smoke. No clot.   RIGHT ATRIUM: Normal  AORTIC VALVE:  [place]. No AI/AS  MITRAL VALVE:    Normal mild MR  TRICUSPID VALVE: Normal. Trivial  TR  PULMONIC VALVE: Normal  No significant PI  INTERATRIAL SEPTUM: No ASD/PFO  PERICARDIUM: No effusion  DESCENDING AORTA: Mild plaque   CARDIOVERSION:     Indications:  Atrial Fibrillation  Procedure Details:  Once the TEE was complete, the patient had the defibrillator pads placed in the anterior and posterior position. Once an appropriate level of sedation was achieved, the patient received a single biphasic, synchronized 200J shock with prompt conversion to sinus rhythm. No apparent complications.   Arvilla Meres, MD  1:10 PM

## 2018-06-02 NOTE — Progress Notes (Signed)
[facility] TEAM 1 - Stepdown/ICU TEAM  Nickola MajorJason Erik Clermont  GEX:[id number] DOB: July 26, 1975 DOA: 05/28/2018 PCP: Patient, No Pcp Per    Brief Narrative:  42yo M w/ a hx of substance abuse including amphetamines and narcotics abuse, and PTSD who presented with SOB for 1 week. In the ER he was found to have new onset atrial fibrillation with RVR, and pulmonary edema.  After admit, further work-up showed EF 20%. His stay has been complicated by the development of hypotension and acute renal failure.  Significant Events: 12/15 admit  12/19 cardiac cath - normal coronaries - EF 20%  Subjective: CHF Team has addressed all active issues today. [facility]   Assessment & Plan:  Acute systolic CHF with cardiogenic shock EF 20% with WMA - likely due to substance abuse - on milrinone drip - care per Cards   New onset a-fib with RVR on IV amiodarone, heparin drip - for TEE/DCCV per Cardiology   Acute hypoxic respiratory failure due to CHF  Methamphetamine abuse / Narcotic abuse / Alcohol abuse / tobacco dependence Has been counseled regarding need to quit substances and alcohol - on Suboxone along with CIWA protocol and low-dose Librium to manage withdrawal  Acute renal failure Due to combination of cardiogenic shock, IV drug abuse  Recent Labs  Lab 05/28/18 1731 05/30/18 0500 05/31/18 0433 06/01/18 0536 06/02/18 0338  CREATININE 1.09 1.33* 1.27* 1.05 1.11    MRSA screen +  DVT prophylaxis: IV heparin  Code Status: FULL CODE Family Communication: no family present at time of exam  Disposition Plan:   Consultants:  CHF Team   Antimicrobials:  none   Objective: Blood pressure 111/83, pulse 79, temperature 98.6 F (37 C), temperature source Oral, resp. rate (!) 8, height 6\' 2"  (1.88 m), weight 79.6 kg, SpO2 95 %.  Intake/Output Summary (Last 24 hours) at 06/02/2018 1005 Last data filed at 06/02/2018 0900 Gross per 24 hour  Intake 979.8 ml  Output -  Net  979.8 ml   Filed Weights   05/31/18 0500 06/01/18 0500 06/02/18 0434  Weight: 84.6 kg 80.4 kg 79.6 kg    Examination: No exam by TRH today - extensive chart review carried out   CBC: Recent Labs  Lab 05/28/18 1731  05/31/18 0433 06/01/18 0536 06/02/18 0338  WBC 8.3   < > 10.5 11.8* 8.3  NEUTROABS 4.1  --   --   --   --   HGB 14.0   < > 13.0 13.5 13.7  HCT 46.7   < > 40.6 41.7 43.0  MCV 95.5   < > 90.6 92.1 91.5  PLT 344   < > 321 328 316   < > = values in this interval not displayed.   Basic Metabolic Panel: Recent Labs  Lab 05/28/18 1731 05/30/18 0500 05/31/18 0433 06/01/18 0536 06/02/18 0338  NA 136 137 138 139 139  K 4.4 5.1 4.4 3.8 3.7  CL 102 96* 98 98 96*  CO2 27 28 30 29 31   GLUCOSE 119* 109* 117* 107* 118*  BUN 13 17 20 12 11   CREATININE 1.09 1.33* 1.27* 1.05 1.11  CALCIUM 8.6* 8.5* 8.1* 8.3* 8.5*  MG 1.9 1.8  --  1.8  --    GFR: Estimated Creatinine Clearance: 98.6 mL/min (by C-G formula based on SCr of 1.11 mg/dL).  Liver Function Tests: Recent Labs  Lab 06/02/18 0338  AST 45*  ALT 29  ALKPHOS 73  BILITOT 0.9  PROT  5.8*  ALBUMIN 3.0*    Coagulation Profile: Recent Labs  Lab 05/30/18 0500  INR 1.17    Recent Results (from the past 240 hour(s))  MRSA PCR Screening     Status: Abnormal   Collection Time: 05/29/18  5:53 PM  Result Value Ref Range Status   MRSA by PCR POSITIVE (A) NEGATIVE Final    Comment:        The GeneXpert MRSA Assay (FDA approved for NASAL specimens only), is one component of a comprehensive MRSA colonization surveillance program. It is not intended to diagnose MRSA infection nor to guide or monitor treatment for MRSA infections. RESULT CALLED TO, READ BACK BY AND VERIFIED WITH: Darl Householder RN 05/29/18 2034 JDW Performed at [facility], [address] [place], [place] [address]      Scheduled Meds: . apixaban  5 mg Oral BID  . aspirin  81 mg Oral Daily  . buprenorphine-naloxone  1 tablet  Sublingual BID  . chlordiazePOXIDE  5 mg Oral TID  . Chlorhexidine Gluconate Cloth  6 each Topical Daily  . Chlorhexidine Gluconate Cloth  6 each Topical Q0600  . digoxin  0.125 mg Oral Daily  . folic acid  1 mg Oral Daily  . furosemide  40 mg Oral Daily  . LORazepam  0-4 mg Intravenous Q12H  . losartan  25 mg Oral Daily  . multivitamin with minerals  1 tablet Oral Daily  . mupirocin ointment  1 application Nasal BID  . nicotine  14 mg Transdermal Daily  . sodium chloride flush  10-40 mL Intracatheter Q12H  . sodium chloride flush  3 mL Intravenous Q12H  . sodium chloride flush  3 mL Intravenous Q12H  . spironolactone  25 mg Oral Daily  . thiamine  100 mg Oral Daily   Or  . thiamine  100 mg Intravenous Daily   Continuous Infusions: . sodium chloride 10 mL/hr at 06/02/18 0900  . sodium chloride    . sodium chloride    . amiodarone 30 mg/hr (06/02/18 0900)  . milrinone 0.125 mcg/kg/min (06/02/18 0900)     LOS: 4 days   Lonia Blood, MD [facility] Office  [phone] Pager - Text Page per Amion  If 7PM-7AM, please contact night-coverage per Amion 06/02/2018, 10:05 AM

## 2018-06-02 NOTE — Interval H&P Note (Signed)
History and Physical Interval Note:  06/02/2018 1:10 PM  Kenneth [name]  has presented today for surgery, with the diagnosis of afib  The various methods of treatment have been discussed with the patient and family. After consideration of risks, benefits and other options for treatment, the patient has consented to  Procedure(s): TRANSESOPHAGEAL ECHOCARDIOGRAM (TEE) (N/A) CARDIOVERSION (N/A) as a surgical intervention .  The patient's history has been reviewed, patient examined, no change in status, stable for surgery.  I have reviewed the patient's chart and labs.  Questions were answered to the patient's satisfaction.     Daniel Bensimhon

## 2018-06-02 NOTE — Clinical Social Work Note (Signed)
Per RN pt to be transfer. Substance use resources placed on pt's hard chart.  [place], [place] [phone]

## 2018-06-02 NOTE — Transfer of Care (Signed)
Immediate Anesthesia Transfer of Care Note  Patient: Kenneth [name]  Procedure(s) Performed: TRANSESOPHAGEAL ECHOCARDIOGRAM (TEE) (N/A ) CARDIOVERSION (N/A )  Patient Location: Endoscopy Unit  Anesthesia Type:MAC  Level of Consciousness: awake, alert , oriented and patient cooperative  Airway & Oxygen Therapy: Patient Spontanous Breathing and Patient connected to nasal cannula oxygen  Post-op Assessment: Report given to RN and Post -op Vital signs reviewed and stable  Post vital signs: Reviewed and stable  Last Vitals:  Vitals Value Taken Time  BP 93/64 06/02/2018  1:00 PM  Temp 36.6 C 06/02/2018 12:59 PM  Pulse 82 06/02/2018  1:03 PM  Resp 14 06/02/2018  1:03 PM  SpO2 98 % 06/02/2018  1:03 PM  Vitals shown include unvalidated device data.  Last Pain:  Vitals:   06/02/18 1300  TempSrc:   PainSc: 0-No pain      Patients Stated Pain Goal: 0 (12/12/14 0109)  Complications: No apparent anesthesia complications

## 2018-06-03 LAB — MAGNESIUM: Magnesium: 2.1 mg/dL (ref 1.7–2.4)

## 2018-06-03 LAB — CBC
HCT: 44.7 % (ref 39.0–52.0)
Hemoglobin: 14.3 g/dL (ref 13.0–17.0)
MCH: 29.5 pg (ref 26.0–34.0)
MCHC: 32 g/dL (ref 30.0–36.0)
MCV: 92.2 fL (ref 80.0–100.0)
PLATELETS: 424 10*3/uL — AB (ref 150–400)
RBC: 4.85 MIL/uL (ref 4.22–5.81)
RDW: 15 % (ref 11.5–15.5)
WBC: 7.4 10*3/uL (ref 4.0–10.5)
nRBC: 0 % (ref 0.0–0.2)

## 2018-06-03 LAB — BASIC METABOLIC PANEL
Anion gap: 11 (ref 5–15)
BUN: 15 mg/dL (ref 6–20)
CHLORIDE: 96 mmol/L — AB (ref 98–111)
CO2: 32 mmol/L (ref 22–32)
Calcium: 9 mg/dL (ref 8.9–10.3)
Creatinine, Ser: 1.21 mg/dL (ref 0.61–1.24)
GFR calc Af Amer: >60 mL/min (ref >60)
GFR calc non Af Amer: >60 mL/min (ref >60)
Glucose, Bld: 109 mg/dL — ABNORMAL HIGH (ref 70–99)
Potassium: 4.7 mmol/L (ref 3.5–5.1)
Sodium: 139 mmol/L (ref 135–145)

## 2018-06-03 LAB — COOXEMETRY PANEL
Carboxyhemoglobin: 1.5 % (ref 0.5–1.5)
Methemoglobin: 1.1 % (ref 0.0–1.5)
O2 Saturation: 61.3 %
Total hemoglobin: 14.5 g/dL (ref 12.0–16.0)

## 2018-06-03 LAB — DIGOXIN LEVEL: Digoxin Level: <0.2 ng/mL — ABNORMAL LOW (ref 0.8–2.0)

## 2018-06-03 MED ORDER — CHLORDIAZEPOXIDE HCL 5 MG PO CAPS
5.0000 mg | ORAL_CAPSULE | Freq: Two times a day (BID) | ORAL | Status: DC
  Administered 2018-06-03 – 2018-06-04 (×2): 5 mg via ORAL
  Filled 2018-06-03 (×2): qty 1

## 2018-06-03 NOTE — Progress Notes (Signed)
[facility] TEAM 1 - Stepdown/ICU TEAM  Nickola MajorJason Erik Toops  ZHY:[id number] DOB: August 05, 1975 DOA: 05/28/2018 PCP: Patient, No Pcp Per    Brief Narrative:  42yo M w/ a hx of substance abuse including amphetamines and narcotics, and PTSD who presented with SOB for 1 week. In the ER he was found to have new onset atrial fibrillation with RVR, and pulmonary edema.  After admit, further work-up showed EF 20%. His stay has been complicated by the development of hypotension and acute renal failure.  Significant Events: 12/15 admit  12/19 cardiac cath - normal coronaries - EF 20% 12/20 TEE / DCCV   Subjective: The patient is resting comfortably in a bedside chair.  He denies chest pain shortness of breath fevers chills nausea or vomiting.  Assessment & Plan:  Acute systolic CHF with cardiogenic shock EF 20% with WMA - likely due to substance abuse - on milrinone drip - care per Cards  Filed Weights   06/01/18 0500 06/02/18 0434 06/03/18 0500  Weight: 80.4 kg 79.6 kg 79.9 kg    New onset a-fib with RVR on IV amiodarone - heparin transitioned to eliquis - s/p TEE/DCCV 12/20  Acute hypoxic respiratory failure due to CHF - resolved   Methamphetamine abuse / Narcotic abuse / Alcohol abuse / tobacco dependence Has been counseled regarding need to quit substances and alcohol - on Suboxone along with CIWA protocol and low-dose Librium to manage withdrawal -no evidence of active withdrawal at this time therefore wean [place] protocol and Librium  Acute renal failure Due to combination of cardiogenic shock and IV drug abuse - renal fxn has now stabilized   Recent Labs  Lab 05/30/18 0500 05/31/18 0433 06/01/18 0536 06/02/18 0338 06/03/18 0418  CREATININE 1.33* 1.27* 1.05 1.11 1.21    MRSA screen +  DVT prophylaxis: eliquis  Code Status: FULL CODE Family Communication: no family present at time of exam  Disposition Plan: per Cardiology   Consultants:  CHF Team   Antimicrobials:    none   Objective: Blood pressure 108/80, pulse 81, temperature 97.7 F (36.5 C), temperature source Oral, resp. rate (!) 9, height 6\' 2"  (1.88 m), weight 79.9 kg, SpO2 96 %.  Intake/Output Summary (Last 24 hours) at 06/03/2018 1127 Last data filed at 06/03/2018 0750 Gross per 24 hour  Intake 843.38 ml  Output 2700 ml  Net -1856.62 ml   Filed Weights   06/01/18 0500 06/02/18 0434 06/03/18 0500  Weight: 80.4 kg 79.6 kg 79.9 kg    Examination: General: No acute respiratory distress Lungs: Clear to auscultation bilaterally without wheezes or crackles Cardiovascular: Regular rate and rhythm without murmur gallop or rub normal S1 and S2 Abdomen: Nontender, nondistended, soft, bowel sounds positive, no rebound, no ascites, no appreciable mass Extremities: No significant cyanosis, clubbing, or edema bilateral lower extremities   CBC: Recent Labs  Lab 05/28/18 1731  06/01/18 0536 06/02/18 0338 06/03/18 0418  WBC 8.3   < > 11.8* 8.3 7.4  NEUTROABS 4.1  --   --   --   --   HGB 14.0   < > 13.5 13.7 14.3  HCT 46.7   < > 41.7 43.0 44.7  MCV 95.5   < > 92.1 91.5 92.2  PLT 344   < > 328 316 424*   < > = values in this interval not displayed.   Basic Metabolic Panel: Recent Labs  Lab 05/30/18 0500  06/01/18 0536 06/02/18 0338 06/03/18 0418  NA 137   < >  139 139 139  K 5.1   < > 3.8 3.7 4.7  CL 96*   < > 98 96* 96*  CO2 28   < > 29 31 32  GLUCOSE 109*   < > 107* 118* 109*  BUN 17   < > 12 11 15   CREATININE 1.33*   < > 1.05 1.11 1.21  CALCIUM 8.5*   < > 8.3* 8.5* 9.0  MG 1.8  --  1.8  --  2.1   < > = values in this interval not displayed.   GFR: Estimated Creatinine Clearance: 90.8 mL/min (by C-G formula based on SCr of 1.21 mg/dL).  Liver Function Tests: Recent Labs  Lab 06/02/18 0338  AST 45*  ALT 29  ALKPHOS 73  BILITOT 0.9  PROT 5.8*  ALBUMIN 3.0*    Coagulation Profile: Recent Labs  Lab 05/30/18 0500  INR 1.17    Recent Results (from the past 240  hour(s))  MRSA PCR Screening     Status: Abnormal   Collection Time: 05/29/18  5:53 PM  Result Value Ref Range Status   MRSA by PCR POSITIVE (A) NEGATIVE Final    Comment:        The GeneXpert MRSA Assay (FDA approved for NASAL specimens only), is one component of a comprehensive MRSA colonization surveillance program. It is not intended to diagnose MRSA infection nor to guide or monitor treatment for MRSA infections. RESULT CALLED TO, READ BACK BY AND VERIFIED WITH: Darl Householder RN 05/29/18 2034 JDW Performed at [facility], [address] [place], [place] [address]      Scheduled Meds: . apixaban  5 mg Oral BID  . aspirin  81 mg Oral Daily  . buprenorphine-naloxone  1 tablet Sublingual BID  . chlordiazePOXIDE  5 mg Oral TID  . Chlorhexidine Gluconate Cloth  6 each Topical Daily  . Chlorhexidine Gluconate Cloth  6 each Topical Q0600  . digoxin  0.125 mg Oral Daily  . folic acid  1 mg Oral Daily  . furosemide  40 mg Oral Daily  . losartan  25 mg Oral Daily  . multivitamin with minerals  1 tablet Oral Daily  . nicotine  14 mg Transdermal Daily  . spironolactone  25 mg Oral Daily  . thiamine  100 mg Oral Daily   Continuous Infusions: . sodium chloride    . amiodarone 30 mg/hr (06/03/18 0425)  . milrinone 0.125 mcg/kg/min (06/02/18 1700)     LOS: 5 days   Lonia Blood, MD [facility] Office  [phone] Pager - Text Page per Amion  If 7PM-7AM, please contact night-coverage per Amion 06/03/2018, 11:27 AM

## 2018-06-03 NOTE — Progress Notes (Signed)
Advanced Heart Failure Rounding Note   Subjective:    Underwent DC-CV on 12/20  Maintaining NSR on IV amio. Remains on milrinone 0.125. Co-o 61%. Eager to go home. Denies SOB, CP, orthopnea or PND.    L/RHC 06/01/18 - Normal coronaries RA = 4 RV = 30/3 PA = 31/16 (21) PCW = 14 Fick cardiac output/index = 5.74/2.8 PVR = 1.2 WU Aosat = 95% PA sat = 70%, 73% SVC = 69%  Assessment: 1. Normal coronaries 2. Severe NICM EF 20% 3. Well compensated hemodynamics on milrinone 0.125 mc/kg/min  Objective:   Weight Range:  Vital Signs:   Temp:  [97.7 F (36.5 C)-98.4 F (36.9 C)] 97.7 F (36.5 C) (12/21 0811) Pulse Rate:  [62-142] 81 (12/21 0811) Resp:  [9-23] 9 (12/21 0500) BP: (87-130)/(55-95) 108/80 (12/21 0811) SpO2:  [83 %-100 %] 96 % (12/21 0811) Weight:  [79.9 kg] 79.9 kg (12/21 0500) Last BM Date: 05/31/18  Weight change: Filed Weights   06/01/18 0500 06/02/18 0434 06/03/18 0500  Weight: 80.4 kg 79.6 kg 79.9 kg   Intake/Output:   Intake/Output Summary (Last 24 hours) at 06/03/2018 1143 Last data filed at 06/03/2018 0750 Gross per 24 hour  Intake 843.38 ml  Output 2700 ml  Net -1856.62 ml   Physical Exam   General:  Well appearing. No resp difficulty HEENT: normal Neck: supple. no JVD. Carotids 2+ bilat; no bruits. No lymphadenopathy or thryomegaly appreciated. Cor: PMI laterally displaced. Regular rate & rhythm. No rubs, gallops or murmurs. Small burn on chest Lungs: clear Abdomen: soft, nontender, nondistended. No hepatosplenomegaly. No bruits or masses. Good bowel sounds. Extremities: no cyanosis, clubbing, rash, edema Neuro: alert & orientedx3, cranial nerves grossly intact. moves all 4 extremities w/o difficulty. Affect pleasant  Telemetry   NSR 70-80s, personally reviewed.   Labs    Basic Metabolic Panel: Recent Labs  Lab 05/28/18 1731 05/30/18 0500 05/31/18 0433 06/01/18 0536 06/02/18 0338 06/03/18 0418  NA 136 137 138 139 139  139  K 4.4 5.1 4.4 3.8 3.7 4.7  CL 102 96* 98 98 96* 96*  CO2 27 28 30 29 31  32  GLUCOSE 119* 109* 117* 107* 118* 109*  BUN 13 17 20 12 11 15   CREATININE 1.09 1.33* 1.27* 1.05 1.11 1.21  CALCIUM 8.6* 8.5* 8.1* 8.3* 8.5* 9.0  MG 1.9 1.8  --  1.8  --  2.1    Liver Function Tests: Recent Labs  Lab 06/02/18 0338  AST 45*  ALT 29  ALKPHOS 73  BILITOT 0.9  PROT 5.8*  ALBUMIN 3.0*   No results for input(s): LIPASE, AMYLASE in the last 168 hours. No results for input(s): AMMONIA in the last 168 hours.  CBC: Recent Labs  Lab 05/28/18 1731  05/30/18 0500 05/31/18 0433 06/01/18 0536 06/02/18 0338 06/03/18 0418  WBC 8.3   < > 10.1 10.5 11.8* 8.3 7.4  NEUTROABS 4.1  --   --   --   --   --   --   HGB 14.0   < > 13.4 13.0 13.5 13.7 14.3  HCT 46.7   < > 42.1 40.6 41.7 43.0 44.7  MCV 95.5   < > 89.4 90.6 92.1 91.5 92.2  PLT 344   < > 332 321 328 316 424*   < > = values in this interval not displayed.    Cardiac Enzymes: No results for input(s): CKTOTAL, CKMB, CKMBINDEX, TROPONINI in the last 168 hours.  BNP: BNP (last 3 results)  Recent Labs    05/28/18 1731 05/29/18 0159  BNP 931.2* 696.2*    ProBNP (last 3 results) No results for input(s): PROBNP in the last 8760 hours.    Other results:  Imaging: No results found.   Medications:     Scheduled Medications: . apixaban  5 mg Oral BID  . aspirin  81 mg Oral Daily  . buprenorphine-naloxone  1 tablet Sublingual BID  . chlordiazePOXIDE  5 mg Oral TID  . Chlorhexidine Gluconate Cloth  6 each Topical Daily  . digoxin  0.125 mg Oral Daily  . folic acid  1 mg Oral Daily  . furosemide  40 mg Oral Daily  . losartan  25 mg Oral Daily  . multivitamin with minerals  1 tablet Oral Daily  . nicotine  14 mg Transdermal Daily  . spironolactone  25 mg Oral Daily  . thiamine  100 mg Oral Daily    Infusions: . sodium chloride    . amiodarone 30 mg/hr (06/03/18 0425)    PRN Medications: sodium chloride,  acetaminophen, LORazepam, nitroGLYCERIN, ondansetron (ZOFRAN) IV   Assessment:   Kenneth [name] is a 42 y.o. male with h/o substance abuse including amphetamines.   [facility] with new acute systolic CHF in the setting of Afib RVR and substance abuse.   Plan/Discussion:    1. Acute systolic CHF -> cardiogenic shock - ECHO with biventricular dysfunction. LVEF 20%.Likely in setting of chronic substance abuse and Afib RVR.  - NICM by cath 06/01/18. Compensated hemodynamics on low dose milrinone. - Co-ox 61% on milrinone 0.125 mcg/kg/min. Will stop today and follow co-ox - Volume status stable (to low) on po lasix 40 mg daily.  - Continue digoxin 0.125 mg daily. Level < 0.2 today - Continue spiro 25 mg daily.  - Continue losartan 25 mg daily.  - Avoid BB for now with low output and hypotension  2. Afib with RVR - Maintaining NSR after DC-CV 12/20 - Continue IV amiodarone one more day - CHA2DS2-VASc is at least 2 (CHF, HTN). Unknown vascular disease. - Continue Eliquis.   3. Substance abuse - UDS 05/28/18 positive for opiates, BZDs, amphetamines, and THC.  - [facility] managing CIWA protocol. Stable.   4. Hypomagnesemia - Mg 2.1 today   Arvilla Meresaniel Dore Oquin, MD  06/03/2018, 11:43 AM  Advanced Heart Failure Team Pager [phone] (M-F; 7a - 4p)  Please contact [facility] for night-coverage after hours (4p -7a ) and weekends on [email or web address]

## 2018-06-04 ENCOUNTER — Encounter (HOSPITAL_COMMUNITY): Payer: Self-pay | Admitting: Internal Medicine

## 2018-06-04 LAB — BASIC METABOLIC PANEL
Anion gap: 12 (ref 5–15)
BUN: 20 mg/dL (ref 6–20)
CHLORIDE: 97 mmol/L — AB (ref 98–111)
CO2: 28 mmol/L (ref 22–32)
Calcium: 9.3 mg/dL (ref 8.9–10.3)
Creatinine, Ser: 1.35 mg/dL — ABNORMAL HIGH (ref 0.61–1.24)
GFR calc Af Amer: >60 mL/min (ref >60)
GFR calc non Af Amer: >60 mL/min (ref >60)
Glucose, Bld: 110 mg/dL — ABNORMAL HIGH (ref 70–99)
Potassium: 4.4 mmol/L (ref 3.5–5.1)
Sodium: 137 mmol/L (ref 135–145)

## 2018-06-04 LAB — COOXEMETRY PANEL
Carboxyhemoglobin: 1.1 % (ref 0.5–1.5)
Methemoglobin: 1.5 % (ref 0.0–1.5)
O2 Saturation: 64.3 %
Total hemoglobin: 15.4 g/dL (ref 12.0–16.0)

## 2018-06-04 LAB — CBC
HCT: 44.7 % (ref 39.0–52.0)
Hemoglobin: 14 g/dL (ref 13.0–17.0)
MCH: 28.9 pg (ref 26.0–34.0)
MCHC: 31.3 g/dL (ref 30.0–36.0)
MCV: 92.2 fL (ref 80.0–100.0)
Platelets: 343 10*3/uL (ref 150–400)
RBC: 4.85 MIL/uL (ref 4.22–5.81)
RDW: 13.9 % (ref 11.5–15.5)
WBC: 7.6 10*3/uL (ref 4.0–10.5)
nRBC: 0 % (ref 0.0–0.2)

## 2018-06-04 MED ORDER — AMIODARONE HCL 200 MG PO TABS: 200.0000 mg | ORAL_TABLET | Freq: Two times a day (BID) | ORAL | 0 refills | Status: DC

## 2018-06-04 MED ORDER — CHLORDIAZEPOXIDE HCL 5 MG PO CAPS: 5.0000 mg | ORAL_CAPSULE | Freq: Every day | ORAL | Status: DC

## 2018-06-04 MED ORDER — FUROSEMIDE 40 MG PO TABS: 40.0000 mg | ORAL_TABLET | Freq: Every day | ORAL | 0 refills | Status: DC

## 2018-06-04 MED ORDER — LOSARTAN POTASSIUM 25 MG PO TABS: 25.0000 mg | ORAL_TABLET | Freq: Every day | ORAL | 0 refills | Status: DC

## 2018-06-04 MED ORDER — DIGOXIN 125 MCG PO TABS: 0.1250 mg | ORAL_TABLET | Freq: Every day | ORAL | 0 refills | Status: DC

## 2018-06-04 MED ORDER — APIXABAN 5 MG PO TABS: 5.0000 mg | ORAL_TABLET | Freq: Two times a day (BID) | ORAL | 0 refills | Status: DC

## 2018-06-04 MED ORDER — AMIODARONE HCL 200 MG PO TABS
200.0000 mg | ORAL_TABLET | Freq: Two times a day (BID) | ORAL | Status: DC
  Administered 2018-06-04: 200 mg via ORAL
  Filled 2018-06-04: qty 1

## 2018-06-04 MED ORDER — SPIRONOLACTONE 25 MG PO TABS: 25.0000 mg | ORAL_TABLET | Freq: Every day | ORAL | 0 refills | Status: DC

## 2018-06-04 NOTE — Progress Notes (Signed)
Pt discharging to home, all discharge paperwork and instructions reviewed with the patient with no questions at this time. Prescriptions provided to patient. IVs removed per protocol.

## 2018-06-04 NOTE — Discharge Summary (Signed)
DISCHARGE SUMMARY  Kenneth [name]  MR#: [id number]  DOB:[birth year]  Date of Admission: 05/28/2018 Date of Discharge: 06/04/2018  Attending Physician:Jeffrey Silvestre Gunner, MD  Patient's WUJ:[id number], No Pcp Per  Consults: CHF Team  Disposition: D/C home   Follow-up Appts: Follow-up Information    [facility] Follow up.   Specialty:  Cardiology Why:  The clinic will call you to arrange a follow up visit. If you have not heard from them within 3 days, call the listed number to arrange for an appointment.  Contact information: [address] [id number] mc [place] [address] [phone]          Discharge Diagnoses: Acute systolic CHF with cardiogenic shock New onset a-fib with RVR Acute hypoxic respiratory failure Methamphetamine abuse / Narcotic abuse Alcohol abuse Tobacco dependence Acute renal failure MRSA screen +  Initial presentation: 42yo M w/ a hx of substance abuse including amphetamines and narcotics, and PTSD who presented with SOB for 1 week. In the ER he was found to have new onset atrial fibrillation with RVR, and pulmonary edema.  After admit, further work-up showed EF 20%. His stay has been complicated by the development of hypotension and acute renal failure.  Hospital Course: 12/15 admit  12/19 cardiac cath - normal coronaries - EF 20% 12/20 TEE / DCCV  12/22 cleared for d/c by CHF Team   Acute systolic CHF with cardiogenic shock EF 20% with WMA - likely due to substance abuse - care per Cards as follows: - ECHO with biventricular dysfunction. LVEF 20%.Likely in setting of chronic substance abuse and Afib RVR.  - NICM by cath 06/01/18. Compensated hemodynamics on low dose milrinone. - Milrinone stopped 12/21 - Co-ox 64% this am - Volume status stable (to low) on po lasix 40 mg daily. Will continue for now - Continue digoxin 0.125 mg daily. Level < 0.2 today - Continue spiro 25  mg daily.  - Continue losartan 25 mg daily.  - Avoid BB for now with low output and hypotension  New onset a-fib with RVR on IV amiodarone - heparin transitioned to eliquis - s/p TEE/DCCV 12/20 - care per Cards as follows: - Maintaining NSR after DC-CV 12/20 - Switch IV amio to po -CHA2DS2-VAScis at least 2 (CHF, HTN). Unknown vascular disease. - Continue Eliquis  Acute hypoxic respiratory failure due to CHF - resolved prior to d/c   Methamphetamine abuse / Narcotic abuse / Alcohol abuse / tobacco dependence Has been counseled regarding need to quit substances and alcohol - on Suboxone during hospital admit, along with CIWA protocol and low-dose Librium to manage withdrawal - no evidence of active withdrawal at time of d/c -the patient had a situation arise at home that he needed to attend to which necessitated his discharge from the hospital a day earlier than was expected -unfortunately this man discharged home on a Sunday -it was explained to the patient that arrangements would not be able to be completed on the Sunday to assist him with Suboxone dosing outside of the hospital -it was suggested that the patient should consider staying in the hospital 1 additional night to allow the medical team to assist in placing him in an outpatient clinic but could assume his Suboxone dosing and ongoing care -the patient chose to proceed with discharge nonetheless  Acute renal failure Due to combination of cardiogenic shock and IV drug abuse - renal fxn fluctuating at time of d/c, and will need to be followed in  the outpt setting   MRSA screen +  Allergies as of 06/04/2018   No Known Allergies     Medication List    TAKE these medications   ALPRAZolam 1 MG tablet Commonly known as:  XANAX Take 1 mg by mouth daily as needed for anxiety.   amiodarone 200 MG tablet Commonly known as:  PACERONE Take 1 tablet (200 mg total) by mouth 2 (two) times daily.   apixaban 5 MG Tabs  tablet Commonly known as:  ELIQUIS Take 1 tablet (5 mg total) by mouth 2 (two) times daily.   digoxin 0.125 MG tablet Commonly known as:  LANOXIN Take 1 tablet (0.125 mg total) by mouth daily. Start taking on:  June 05, 2018   furosemide 40 MG tablet Commonly known as:  LASIX Take 1 tablet (40 mg total) by mouth daily. Start taking on:  June 05, 2018   losartan 25 MG tablet Commonly known as:  COZAAR Take 1 tablet (25 mg total) by mouth daily. Start taking on:  June 05, 2018   spironolactone 25 MG tablet Commonly known as:  ALDACTONE Take 1 tablet (25 mg total) by mouth daily. Start taking on:  June 05, 2018      **THE PATIENT WAS NOT PROVIDED WITH PRESCRIPTIONS FOR ANY CONTROLLED SUBSTANCES AT THE TIME OF HIS D/C HOME  Day of Discharge BP 107/81 (BP Location: Right Arm)   Pulse 94   Temp 97.8 F (36.6 C) (Oral)   Resp 18   Ht 6\' 2"  (1.88 m)   Wt 80 kg   SpO2 100%   BMI 22.64 kg/m   Physical Exam: [facility] as pt was d/c unexpectedly due to an acute issue at his home to which he needed to urgently attend.   Basic Metabolic Panel: Recent Labs  Lab 05/30/18 0500 05/31/18 0433 06/01/18 0536 06/02/18 0338 06/03/18 0418 06/04/18 0457  NA 137 138 139 139 139 137  K 5.1 4.4 3.8 3.7 4.7 4.4  CL 96* 98 98 96* 96* 97*  CO2 28 30 29 31  32 28  GLUCOSE 109* 117* 107* 118* 109* 110*  BUN 17 20 12 11 15 20   CREATININE 1.33* 1.27* 1.05 1.11 1.21 1.35*  CALCIUM 8.5* 8.1* 8.3* 8.5* 9.0 9.3  MG 1.8  --  1.8  --  2.1  --     Liver Function Tests: Recent Labs  Lab 06/02/18 0338  AST 45*  ALT 29  ALKPHOS 73  BILITOT 0.9  PROT 5.8*  ALBUMIN 3.0*    Coags: Recent Labs  Lab 05/30/18 0500  INR 1.17   CBC: Recent Labs  Lab 05/31/18 0433 06/01/18 0536 06/02/18 0338 06/03/18 0418 06/04/18 0457  WBC 10.5 11.8* 8.3 7.4 7.6  HGB 13.0 13.5 13.7 14.3 14.0  HCT 40.6 41.7 43.0 44.7 44.7  MCV 90.6 92.1 91.5 92.2 92.2  PLT 321 328  316 424* 343    BNP (last 3 results) Recent Labs    05/28/18 1731 05/29/18 0159  BNP 931.2* 696.2*     Recent Results (from the past 240 hour(s))  MRSA PCR Screening     Status: Abnormal   Collection Time: 05/29/18  5:53 PM  Result Value Ref Range Status   MRSA by PCR POSITIVE (A) NEGATIVE Final    Comment:        The GeneXpert MRSA Assay (FDA approved for NASAL specimens only), is one component of a comprehensive MRSA colonization surveillance program. It is not intended  to diagnose MRSA infection nor to guide or monitor treatment for MRSA infections. RESULT CALLED TO, READ BACK BY AND VERIFIED WITH: Darl Householder RN 05/29/18 2034 JDW Performed at [facility], [address], [place], [place] [address]       Time spent in discharge (includes decision making & examination of pt): 35 minutes - NO CHARGE as pt not seen prior to D/C home   06/04/2018, 6:33 PM   Lonia Blood, MD [facility] Office  [phone] Pager [phone]  On-Call/Text Page:      [email or web address]      password [facility]

## 2018-06-04 NOTE — Progress Notes (Signed)
[place] TEAM 1 - Stepdown/ICU TEAM  Kailyn Bergemann  BSJ:[id number] DOB: 1976/04/24 DOA: 05/28/2018 PCP: Patient, No Pcp Per    Brief Narrative:  42yo M w/ a hx of substance abuse including amphetamines and narcotics, and PTSD who presented with SOB for 1 week. In the ER he was found to have new onset atrial fibrillation with RVR, and pulmonary edema.  After admit, further work-up showed EF 20%. His stay has been complicated by the development of hypotension and acute renal failure.  Significant Events: 12/15 admit  12/19 cardiac cath - normal coronaries - EF 20% 12/20 TEE / DCCV   Subjective: All active issues are currently being addressed by the CHF Team. [facility] remains the attending service.   Assessment & Plan:  Acute systolic CHF with cardiogenic shock EF 20% with WMA - likely due to substance abuse - care per Cards  [facility] Weights   06/02/18 0434 06/03/18 0500 06/04/18 0415  Weight: 79.6 kg 79.9 kg 80 kg    New onset a-fib with RVR on IV amiodarone - heparin transitioned to eliquis - s/p TEE/DCCV 12/20  Acute hypoxic respiratory failure due to CHF - resolved   Methamphetamine abuse / Narcotic abuse / Alcohol abuse / tobacco dependence Has been counseled regarding need to quit substances and alcohol - on Suboxone along with CIWA protocol and low-dose Librium to manage withdrawal -no evidence of active withdrawal at this time - stop CIWA today and wean Librium  Acute renal failure Due to combination of cardiogenic shock and IV drug abuse - renal fxn fluctuating   Recent Labs  Lab 05/31/18 0433 06/01/18 0536 06/02/18 0338 06/03/18 0418 06/04/18 0457  CREATININE 1.27* 1.05 1.11 1.21 1.35*    MRSA screen +  DVT prophylaxis: eliquis  Code Status: FULL CODE Family Communication:  Disposition Plan: per Cardiology   Consultants:  CHF Team   Antimicrobials:  none   Objective: Blood pressure 107/81, pulse 94, temperature 97.8 F (36.6 C), temperature  source Oral, resp. rate 18, height 6\' 2"  (1.88 m), weight 80 kg, SpO2 100 %.  Intake/Output Summary (Last 24 hours) at 06/04/2018 0921 Last data filed at 06/04/2018 0400 Gross per 24 hour  Intake 951.94 ml  Output -  Net 951.94 ml   Filed Weights   06/02/18 0434 06/03/18 0500 06/04/18 0415  Weight: 79.6 kg 79.9 kg 80 kg    Examination: No exam by [facility] today    CBC: Recent Labs  Lab 05/28/18 1731  06/02/18 0338 06/03/18 0418 06/04/18 0457  WBC 8.3   < > 8.3 7.4 7.6  NEUTROABS 4.1  --   --   --   --   HGB 14.0   < > 13.7 14.3 14.0  HCT 46.7   < > 43.0 44.7 44.7  MCV 95.5   < > 91.5 92.2 92.2  PLT 344   < > 316 424* 343   < > = values in this interval not displayed.   Basic Metabolic Panel: Recent Labs  Lab 05/30/18 0500  06/01/18 0536 06/02/18 0338 06/03/18 0418 06/04/18 0457  NA 137   < > 139 139 139 137  K 5.1   < > 3.8 3.7 4.7 4.4  CL 96*   < > 98 96* 96* 97*  CO2 28   < > 29 31 32 28  GLUCOSE 109*   < > 107* 118* 109* 110*  BUN 17   < > 12 11 15 20   CREATININE 1.33*   < >  1.05 1.11 1.21 1.35*  CALCIUM 8.5*   < > 8.3* 8.5* 9.0 9.3  MG 1.8  --  1.8  --  2.1  --    < > = values in this interval not displayed.   GFR: Estimated Creatinine Clearance: 81.5 mL/min (A) (by C-G formula based on SCr of 1.35 mg/dL (H)).  Liver Function Tests: Recent Labs  Lab 06/02/18 0338  AST 45*  ALT 29  ALKPHOS 73  BILITOT 0.9  PROT 5.8*  ALBUMIN 3.0*    Coagulation Profile: Recent Labs  Lab 05/30/18 0500  INR 1.17    Recent Results (from the past 240 hour(s))  MRSA PCR Screening     Status: Abnormal   Collection Time: 05/29/18  5:53 PM  Result Value Ref Range Status   MRSA by PCR POSITIVE (A) NEGATIVE Final    Comment:        The GeneXpert MRSA Assay (FDA approved for NASAL specimens only), is one component of a comprehensive MRSA colonization surveillance program. It is not intended to diagnose MRSA infection nor to guide or monitor treatment for MRSA  infections. RESULT CALLED TO, READ BACK BY AND VERIFIED WITH: Darl HouseholderS WELKER RN 05/29/18 2034 JDW Performed at [facility], [address] [place], [place] [id number]      Scheduled Meds: . apixaban  5 mg Oral BID  . aspirin  81 mg Oral Daily  . buprenorphine-naloxone  1 tablet Sublingual BID  . chlordiazePOXIDE  5 mg Oral BID  . Chlorhexidine Gluconate Cloth  6 each Topical Daily  . digoxin  0.125 mg Oral Daily  . folic acid  1 mg Oral Daily  . furosemide  40 mg Oral Daily  . losartan  25 mg Oral Daily  . multivitamin with minerals  1 tablet Oral Daily  . nicotine  14 mg Transdermal Daily  . spironolactone  25 mg Oral Daily  . thiamine  100 mg Oral Daily   Continuous Infusions: . sodium chloride    . amiodarone 30 mg/hr (06/04/18 0400)     LOS: 6 days   Lonia BloodJeffrey T. , MD [facility] Office  [phone] Pager - Text Page per Amion  If 7PM-7AM, please contact night-coverage per Amion 06/04/2018, 9:21 AM

## 2018-06-04 NOTE — Care Management (Signed)
Pt given Eliquis cards.  Pt verbalizes understanding to use 30 day free card to get the next 30 days free and use the copay card to reduce copay.    Pt inquiring about where to follow up for suboxone treatment.  List of treatment center printed off of internet for patient.

## 2018-06-04 NOTE — Progress Notes (Addendum)
Advanced Heart Failure Rounding Note   Subjective:    Underwent DC-CV on 12/20  Milrinone off. Remains in NSR. Feels good. No CP or SOB. Eager to go home. CVP 4. Co-ox 64%   L/RHC 06/01/18 - Normal coronaries RA = 4 RV = 30/3 PA = 31/16 (21) PCW = 14 Fick cardiac output/index = 5.74/2.8 PVR = 1.2 WU Aosat = 95% PA sat = 70%, 73% SVC = 69%  Assessment: 1. Normal coronaries 2. Severe NICM EF 20% 3. Well compensated hemodynamics on milrinone 0.125 mc/kg/min  Objective:   Weight Range:  Vital Signs:   Temp:  [97.8 F (36.6 C)-98.7 F (37.1 C)] 97.8 F (36.6 C) (12/22 0415) Pulse Rate:  [70-94] 94 (12/22 0415) Resp:  [8-19] 18 (12/22 0415) BP: (100-114)/(73-83) 107/81 (12/22 0415) SpO2:  [93 %-100 %] 100 % (12/22 0415) Weight:  [80 kg] 80 kg (12/22 0415) Last BM Date: 05/31/18  Weight change: Filed Weights   06/02/18 0434 06/03/18 0500 06/04/18 0415  Weight: 79.6 kg 79.9 kg 80 kg   Intake/Output:   Intake/Output Summary (Last 24 hours) at 06/04/2018 1042 Last data filed at 06/04/2018 0400 Gross per 24 hour  Intake 951.94 ml  Output -  Net 951.94 ml   Physical Exam   General:  Well appearing. No resp difficulty HEENT: normal Neck: supple. no JVD. Carotids 2+ bilat; no bruits. No lymphadenopathy or thryomegaly appreciated. Cor: PMI laterally displaced. Regular rate & rhythm. No rubs, gallops or murmurs. Lungs: clear Abdomen: soft, nontender, nondistended. No hepatosplenomegaly. No bruits or masses. Good bowel sounds. Extremities: no cyanosis, clubbing, rash, edema Neuro: alert & orientedx3, cranial nerves grossly intact. moves all 4 extremities w/o difficulty. Affect pleasant   Telemetry   NSR 80-90s, personally reviewed.   Labs    Basic Metabolic Panel: Recent Labs  Lab 05/28/18 1731 05/30/18 0500 05/31/18 0433 06/01/18 0536 06/02/18 0338 06/03/18 0418 06/04/18 0457  NA 136 137 138 139 139 139 137  K 4.4 5.1 4.4 3.8 3.7 4.7 4.4    CL 102 96* 98 98 96* 96* 97*  CO2 27 28 30 29 31  32 28  GLUCOSE 119* 109* 117* 107* 118* 109* 110*  BUN 13 17 20 12 11 15 20   CREATININE 1.09 1.33* 1.27* 1.05 1.11 1.21 1.35*  CALCIUM 8.6* 8.5* 8.1* 8.3* 8.5* 9.0 9.3  MG 1.9 1.8  --  1.8  --  2.1  --     Liver Function Tests: Recent Labs  Lab 06/02/18 0338  AST 45*  ALT 29  ALKPHOS 73  BILITOT 0.9  PROT 5.8*  ALBUMIN 3.0*   No results for input(s): LIPASE, AMYLASE in the last 168 hours. No results for input(s): AMMONIA in the last 168 hours.  CBC: Recent Labs  Lab 05/28/18 1731  05/31/18 0433 06/01/18 0536 06/02/18 0338 06/03/18 0418 06/04/18 0457  WBC 8.3   < > 10.5 11.8* 8.3 7.4 7.6  NEUTROABS 4.1  --   --   --   --   --   --   HGB 14.0   < > 13.0 13.5 13.7 14.3 14.0  HCT 46.7   < > 40.6 41.7 43.0 44.7 44.7  MCV 95.5   < > 90.6 92.1 91.5 92.2 92.2  PLT 344   < > 321 328 316 424* 343   < > = values in this interval not displayed.    Cardiac Enzymes: No results for input(s): CKTOTAL, CKMB, CKMBINDEX, TROPONINI in the last 168  hours.  BNP: BNP (last 3 results) Recent Labs    05/28/18 1731 05/29/18 0159  BNP 931.2* 696.2*    ProBNP (last 3 results) No results for input(s): PROBNP in the last 8760 hours.    Other results:  Imaging: No results found.   Medications:     Scheduled Medications: . apixaban  5 mg Oral BID  . aspirin  81 mg Oral Daily  . buprenorphine-naloxone  1 tablet Sublingual BID  . chlordiazePOXIDE  5 mg Oral BID  . [START ON 06/05/2018] chlordiazePOXIDE  5 mg Oral Daily  . Chlorhexidine Gluconate Cloth  6 each Topical Daily  . digoxin  0.125 mg Oral Daily  . folic acid  1 mg Oral Daily  . furosemide  40 mg Oral Daily  . losartan  25 mg Oral Daily  . multivitamin with minerals  1 tablet Oral Daily  . nicotine  14 mg Transdermal Daily  . spironolactone  25 mg Oral Daily  . thiamine  100 mg Oral Daily    Infusions: . sodium chloride    . amiodarone 30 mg/hr (06/04/18  0400)    PRN Medications: sodium chloride, acetaminophen, nitroGLYCERIN, ondansetron (ZOFRAN) IV   Assessment:   Kenneth [name] is a 42 y.o. male with h/o substance abuse including amphetamines.   He presented to [facility] 05/28/18 with new acute systolic CHF in the setting of Afib RVR and substance abuse.   Plan/Discussion:    1. Acute systolic CHF -> cardiogenic shock - ECHO with biventricular dysfunction. LVEF 20%.Likely in setting of chronic substance abuse and Afib RVR.  - NICM by cath 06/01/18. Compensated hemodynamics on low dose milrinone. - Milrinone stopped 12/21 - Co-ox 64% this am - Volume status stable (to low) on po lasix 40 mg daily. Will continue for now - Continue digoxin 0.125 mg daily. Level < 0.2 today - Continue spiro 25 mg daily.  - Continue losartan 25 mg daily.  - Avoid BB for now with low output and hypotension  2. Afib with RVR - Maintaining NSR after DC-CV 12/20 - Switch IV amio to po - CHA2DS2-VASc is at least 2 (CHF, HTN). Unknown vascular disease. - Continue Eliquis.   3. Substance abuse - UDS 05/28/18 positive for opiates, BZDs, amphetamines, and THC.  - [facility] CIWA protocol. Stable.   4. Hypomagnesemia - Mg 2.1 yesterday  Likely home in am if stable off milrinone and IV amio.   Arvilla Meres, MD  06/04/2018, 10:42 AM  Advanced Heart Failure Team Pager [phone] (M-F; 7a - 4p)  Please contact [facility] for night-coverage after hours (4p -7a ) and weekends on [email or web address]   Addnedum:  Patient now asking to go home today as his heater broke this am and needs to go home and fix it. I am ok with d/c today from our standpoint.   Home meds Amio 200 bid Lasix 40 daily Eliquis 5 bid Digoxin 0.125 daily Losartan 25 daily Spiro 25 daily   Can stop ASA   Will arrange f/u in HF Clinic.   Arvilla Meres, MD  11:28 AM

## 2018-06-08 ENCOUNTER — Other Ambulatory Visit: Payer: Self-pay

## 2018-06-08 ENCOUNTER — Encounter (HOSPITAL_COMMUNITY): Payer: Self-pay

## 2018-06-08 ENCOUNTER — Inpatient Hospital Stay (HOSPITAL_COMMUNITY): Payer: Self-pay

## 2018-06-08 ENCOUNTER — Ambulatory Visit (HOSPITAL_COMMUNITY)
Admission: RE | Admit: 2018-06-08 | Discharge: 2018-06-08 | Disposition: A | Discharge status: Home / Self Care | Payer: BLUE CROSS/BLUE SHIELD | Source: Ambulatory Visit | Attending: Internal Medicine | Admitting: Internal Medicine

## 2018-06-08 VITALS — BP 120/80 | HR 74 | Wt 177.0 lb

## 2018-06-08 DIAGNOSIS — F4310 Post-traumatic stress disorder, unspecified: Secondary | ICD-10-CM | POA: Diagnosis not present

## 2018-06-08 DIAGNOSIS — I428 Other cardiomyopathies: Secondary | ICD-10-CM | POA: Insufficient documentation

## 2018-06-08 DIAGNOSIS — I5022 Chronic systolic (congestive) heart failure: Secondary | ICD-10-CM | POA: Diagnosis not present

## 2018-06-08 DIAGNOSIS — I480 Paroxysmal atrial fibrillation: Secondary | ICD-10-CM | POA: Insufficient documentation

## 2018-06-08 DIAGNOSIS — I509 Heart failure, unspecified: Secondary | ICD-10-CM

## 2018-06-08 DIAGNOSIS — F1910 Other psychoactive substance abuse, uncomplicated: Secondary | ICD-10-CM | POA: Diagnosis not present

## 2018-06-08 DIAGNOSIS — Z79899 Other long term (current) drug therapy: Secondary | ICD-10-CM | POA: Diagnosis not present

## 2018-06-08 DIAGNOSIS — F419 Anxiety disorder, unspecified: Secondary | ICD-10-CM

## 2018-06-08 DIAGNOSIS — F17210 Nicotine dependence, cigarettes, uncomplicated: Secondary | ICD-10-CM | POA: Diagnosis not present

## 2018-06-08 LAB — BASIC METABOLIC PANEL
Anion gap: 9 (ref 5–15)
BUN: 17 mg/dL (ref 6–20)
CO2: 25 mmol/L (ref 22–32)
CREATININE: 1.19 mg/dL (ref 0.61–1.24)
Calcium: 9 mg/dL (ref 8.9–10.3)
Chloride: 102 mmol/L (ref 98–111)
GFR calc Af Amer: >60 mL/min (ref >60)
GFR calc non Af Amer: >60 mL/min (ref >60)
Glucose, Bld: 76 mg/dL (ref 70–99)
Potassium: 4.7 mmol/L (ref 3.5–5.1)
Sodium: 136 mmol/L (ref 135–145)

## 2018-06-08 MED ORDER — FUROSEMIDE 40 MG PO TABS: 40.0000 mg | ORAL_TABLET | ORAL | 0 refills | Status: DC | PRN

## 2018-06-08 MED ORDER — SACUBITRIL-VALSARTAN 24-26 MG PO TABS: 1.0000 | ORAL_TABLET | Freq: Two times a day (BID) | ORAL | 3 refills | Status: DC

## 2018-06-08 NOTE — Progress Notes (Deleted)
Patient ID: Kenneth [name], male   DOB: 14-Mar-1976, 42 y.o.   MRN: [id number]   After hospitalization 12/15-12/22/2019.  From discharge summary: Discharge Diagnoses: Acute systolic CHF with cardiogenic shock New onset a-fib with RVR Acute hypoxic respiratory failure Methamphetamine abuse / Narcotic abuse Alcohol abuse Tobacco dependence Acute renal failure MRSA screen +  Initial presentation: 42yo M w/ a hx of substance abuse including amphetamines and narcotics, and PTSD who presented with SOB for 1 week. In the ER he was found to have new onset atrial fibrillation with RVR, and pulmonary edema.  After admit, further work-up showed EF 20%. His stay has been complicated by the development of hypotension and acute renal failure.  Hospital Course: 12/15 admit  12/19 cardiac cath - normal coronaries - EF 20% 12/20 TEE / DCCV  12/22 cleared for d/c by CHF Team   Acute systolic CHF with cardiogenic shock EF 20% with WMA - likely due to substance abuse - care per Cards as follows: - ECHO with biventricular dysfunction. LVEF 20%.Likely in setting of chronic substance abuse and Afib RVR.  - NICM by cath 06/01/18. Compensated hemodynamics on low dose milrinone. - Milrinone stopped 12/21 - Co-ox 64%this am - Volume status stable (to low) on po lasix 40 mg daily.Will continue for now - Continue digoxin 0.125 mg daily. Level < 0.2 today - Continue spiro 25 mg daily.  - Continue losartan 25 mg daily.  - Avoid BB for now with low output and hypotension  New onset a-fib with RVR on IV amiodarone - heparin transitioned to eliquis - s/p TEE/DCCV 12/20 - care per Cards as follows: - Maintaining NSR after DC-CV 12/20 -Switch IV amio to po -CHA2DS2-VAScis at least 2 (CHF, HTN). Unknown vascular disease. - Continue Eliquis  Acute hypoxic respiratory failure due to CHF - resolved prior to d/c   Methamphetamine abuse / Narcotic abuse / Alcohol abuse / tobacco dependence Has  been counseled regarding need to quit substances and alcohol - on Suboxone during hospital admit, along with CIWA protocol and low-dose Librium to manage withdrawal - no evidence of active withdrawal at time of d/c-the patient had a situation arise at home that he needed to attend to which necessitated his discharge from the hospital a day earlier than was expected -unfortunately this man discharged home on a Sunday -it was explained to the patient that arrangements would not be able to be completed on the Sunday to assist him with Suboxone dosing outside of the hospital -it was suggested that the patient should consider staying in the hospital 1 additional night to allow the medical team to assist in placing him in an outpatient clinic but could assume his Suboxone dosing and ongoing care -the patient chose to proceed with discharge nonetheless  Acute renal failure Due to combination of cardiogenic shock and IV drug abuse - renal fxnfluctuatingat time of d/c, and will need to be followed in the outpt setting   MRSA screen +

## 2018-06-08 NOTE — Patient Instructions (Signed)
STOP Losartan.  Start Entresto 24/26mg  twice daily.  Take Lasix only as needed. (please call our office at [phone] if you have any questions about when to take this medication)  Continue Amiodarone twice daily for 1 week and then decrease to once daily.  Routine lab work today. Will notify you of abnormal results   Repeat labs in 10 days.  Follow up in 4 weeks.

## 2018-06-08 NOTE — Progress Notes (Signed)
Advanced Heart Failure Clinic Note   Referring Physician: PCP: Patient, No Pcp Per PCP-Cardiologist: Arvilla Meres, MD   HPI:  Kenneth [name] is a 42 y.o. male w/ h/o Chronic systolic CHF, Paroxysmal Afib, polysubstance abuse, and chronic suboxone use.  Admitted 12/15 - 06/04/18 with new onset systolic CHF. Cath with NICM, thought to be most likely due to combination of Afib with RVR and polysubstance abuse.  Medications adjusted as tolerated. He underwent TEE/DCCV 06/02/18 and maintained NSR to discharge.   He presents today for post hospital follow up. He is feeling great. Has Medicaid, and has not had any trouble picking up his medications. Mild lightheadedness when he first got home that has gradually improved. He has mild chest pressure when he gets very anxious. No palpitations, edema, orthopnea or PND. Edema resolved. Breathing back to baseline. Has PCP visit 06/13/18 to discuss anxiety and his suboxone. 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 He has stayed away from drugs, and states multiple times that he has been scared away from them.   Review of systems complete and found to be negative unless listed in HPI.    L/RHC 06/01/18 - Normal coronaries RA = 4 RV = 30/3 PA = 31/16 (21) PCW = 14 Fick cardiac output/index = 5.74/2.8 PVR = 1.2 WU Aosat = 95% PA sat = 70%, 73% SVC = 69%  Assessment: 1. Normal coronaries 2. Severe NICM EF 20% 3. Well compensated hemodynamics on milrinone 0.125 mc/kg/min  Past Medical History:  Diagnosis Date  . Frequent headaches   . Narcotic abuse (HCC)   . PTSD (post-traumatic stress disorder)     Current Outpatient Medications  Medication Sig Dispense Refill  . ALPRAZolam (XANAX) 1 MG tablet Take 1 mg by mouth daily as needed for anxiety.    Marland Kitchen amiodarone (PACERONE) 200 MG tablet Take 1 tablet (200 mg total) by mouth 2 (two) times daily. 60  tablet 0  . apixaban (ELIQUIS) 5 MG TABS tablet Take 1 tablet (5 mg total) by mouth 2 (two) times daily. 60 tablet 0  . digoxin (LANOXIN) 0.125 MG tablet Take 1 tablet (0.125 mg total) by mouth daily. 30 tablet 0  . furosemide (LASIX) 40 MG tablet Take 1 tablet (40 mg total) by mouth daily. 30 tablet 0  . losartan (COZAAR) 25 MG tablet Take 1 tablet (25 mg total) by mouth daily. 30 tablet 0  . spironolactone (ALDACTONE) 25 MG tablet Take 1 tablet (25 mg total) by mouth daily. 30 tablet 0   No current facility-administered medications for this encounter.    No Known Allergies   Social History   Socioeconomic History  . Marital status: Married    Spouse name: Not on file  . Number of children: Not on file  . Years of education: Not on file  . Highest education level: Not on file  Occupational History  . Not on file  Social Needs  . Financial resource strain: Not on file  . Food insecurity:    Worry: Not on file    Inability: Not on file  . Transportation needs:    Medical: Not on file    Non-medical: Not on file  Tobacco Use  . Smoking status: Current Some Day Smoker    Packs/day: 1.00    Years: 0.50    Pack years: 0.50    Types: Cigarettes  . Smokeless tobacco: Current  User    Types: Snuff  Substance and Sexual Activity  . Alcohol use: No    Alcohol/week: 0.0 standard drinks  . Drug use: No    Types: Cocaine, Marijuana  . Sexual activity: Yes    Birth control/protection: None  Lifestyle  . Physical activity:    Days per week: Not on file    Minutes per session: Not on file  . Stress: Not on file  Relationships  . Social connections:    Talks on phone: Not on file    Gets together: Not on file    Attends religious service: Not on file    Active member of club or organization: Not on file    Attends meetings of clubs or organizations: Not on file    Relationship status: Not on file  . Intimate partner violence:    Fear of current or ex partner: Not on file     Emotionally abused: Not on file    Physically abused: Not on file    Forced sexual activity: Not on file  Other Topics Concern  . Not on file  Social History Narrative  . Not on file      Family History  Problem Relation Age of Onset  . Miscarriages / [place]birth[place] Brother   . Schizophrenia Brother     Vitals:   06/08/18 1046  BP: 120/80  Pulse: 74  SpO2: 98%  Weight: 80.3 kg (177 lb)    Wt Readings from Last 3 Encounters:  06/08/18 80.3 kg (177 lb)  06/04/18 80 kg (176 lb 5.9 oz)  02/16/18 81.6 kg (180 lb)     PHYSICAL EXAM: General:  Well appearing. No respiratory difficulty HEENT: normal Neck: supple. no JVD. Carotids 2+ bilat; no bruits. No lymphadenopathy or thyromegaly appreciated. Cor: PMI nondisplaced. Regular rate & rhythm. No rubs, gallops or murmurs. Lungs: clear Abdomen: soft, nontender, nondistended. No hepatosplenomegaly. No bruits or masses. Good bowel sounds. Extremities: no cyanosis, clubbing, rash, edema Neuro: alert & oriented x 3, cranial nerves grossly intact. moves all 4 extremities w/o difficulty. Affect pleasant.  ECG: NSR 73 bpm, personally revieqwed  ASSESSMENT & PLAN:  1. Chronic systolic CHF, NICM - ECHO 05/29/18 LVEF 20%.Likely in setting of chronic substance abuse and Afib RVR.  - NICM by cath 06/01/18. Compensated hemodynamics on low dose milrinone. - Milrinone stopped 12/21 - NYHA II symptoms - Volume status stable on exam.   - Stop losartan. Change to Entresto 24/26 mg BID.  - change lasix to as needed only.  - Continue digoxin 0.125 mg daily. Level < 0.2 06/04/18 - Continue spiro 25 mg daily. BMET today.  - Avoid BB for now with low output and hypotension. Will consider low dose at next visit.   2. Paroxysmal Afib  - Maintaining NSR. - Continue amiodarone 200 mg BID x 1 more week, then decrease to 200 mg daily.  -CHA2DS2-VAScis at least 2 (CHF, HTN). Unknown vascular disease. - Continue Eliquis 5mg  BID  3. Substance  abuse - UDS 05/28/18 positive for opiates, BZDs, amphetamines, and THC. - He is on Suboxone. [name]. He was advised to stay until a weekday in order to have his Suboxone arranged, but chose to be discharged instead - It has been communicated that under no circumstances will the HF clinic address his Suboxone.   4. Social - Will ask HFSW to contact for help with medications, transportation, or appointments prn.   Doing very well. Meds and labs as  above. Repeat BMET 10 days with switch to [facility]. Pt knows to call with any change in symptoms. RTC 4 weeks, sooner with symptoms. Plan repeat Echo in March April for ICD consideration.   Graciella Freer, PA-C 06/08/18   Patient seen and examined with the above-signed Advanced Practice Provider and/or Housestaff. I personally reviewed laboratory data, imaging studies and relevant notes. I independently examined the patient and formulated the important aspects of the plan. I have edited the note to reflect any of my changes or salient points. I have personally discussed the plan with the patient and/or family.  Much improved. Maintaining NSR on amio. No bleeding with Eliquis. Volume stats looks great. NYHA II. S3 has resolved. Agree with switch to [facility]. Continue to follow closely. Stressed need to remain compliant with HF meds and avoid substance use.   Arvilla Meres, MD  6:42 PM

## 2018-06-12 ENCOUNTER — Encounter (HOSPITAL_COMMUNITY): Payer: Self-pay | Admitting: Internal Medicine

## 2018-06-12 ENCOUNTER — Telehealth (HOSPITAL_COMMUNITY): Payer: Self-pay | Admitting: Cardiology

## 2018-06-12 NOTE — Telephone Encounter (Signed)
entresto PA completed via [employer] [facility] #[id number] Valid 06/12/18-06/13/2019 auth information faxed to pharmacy [facility] [phone]

## 2018-06-12 NOTE — Anesthesia Postprocedure Evaluation (Signed)
Anesthesia Post Note  Patient: Kenneth [name]  Procedure(s) Performed: TRANSESOPHAGEAL ECHOCARDIOGRAM (TEE) (N/A ) CARDIOVERSION (N/A )     Patient location during evaluation: Endoscopy Anesthesia Type: General Level of consciousness: awake and alert Pain management: pain level controlled Vital Signs Assessment: post-procedure vital signs reviewed and stable Respiratory status: spontaneous breathing, nonlabored ventilation, respiratory function stable and patient connected to nasal cannula oxygen Cardiovascular status: blood pressure returned to baseline and stable Postop Assessment: no apparent nausea or vomiting Anesthetic complications: no    Last Vitals:  Vitals:   06/04/18 0300 06/04/18 0415  BP:  107/81  Pulse:  94  Resp: 15 18  Temp:  36.6 C  SpO2:  100%    Last Pain:  Vitals:   06/04/18 0415  TempSrc: Oral  PainSc:                  Json Koelzer

## 2018-06-13 ENCOUNTER — Inpatient Hospital Stay: Payer: Self-pay

## 2018-06-13 ENCOUNTER — Telehealth (HOSPITAL_COMMUNITY): Payer: Self-pay

## 2018-06-13 NOTE — Telephone Encounter (Signed)
Pt called with no answer VM left for to to call back. Cr stable. K upper end of normal.  Please make sure he is NOT on any potassium supplementation (wasn't ordered, so shouldn't be) and encourage him to avoid high potassium food.

## 2018-06-15 ENCOUNTER — Encounter (HOSPITAL_COMMUNITY): Payer: Self-pay | Admitting: Licensed Clinical Social Worker

## 2018-06-15 ENCOUNTER — Telehealth (HOSPITAL_COMMUNITY): Payer: Self-pay | Admitting: Licensed Clinical Social Worker

## 2018-06-15 NOTE — Telephone Encounter (Signed)
CSW consulted to complete SDOH with patient per APP request.  SDOH assessment completed and concerns identified.  Pt has been unable to work for the past month due to his medical concerns and it has starting to make things difficult financially.  Pt interested in applying for disability and food stamps- CSW explained how to proceed and emailed resources.  Pt also states that it has become difficult to afford enough food- pt is interested in food pantry information- states he has a car and would have reliable transportation to pick up food when needed- food pantry list emailed.  CSW will continue to follow in clinic and assist as needed  Burna Sis, LCSW Clinical Social Worker Advanced Heart Failure Clinic [phone]

## 2018-06-19 ENCOUNTER — Ambulatory Visit (HOSPITAL_COMMUNITY)
Admission: RE | Admit: 2018-06-19 | Discharge: 2018-06-19 | Disposition: A | Discharge status: Home / Self Care | Payer: BLUE CROSS/BLUE SHIELD | Source: Ambulatory Visit | Attending: Internal Medicine | Admitting: Internal Medicine

## 2018-06-19 DIAGNOSIS — I509 Heart failure, unspecified: Secondary | ICD-10-CM | POA: Insufficient documentation

## 2018-06-19 LAB — BASIC METABOLIC PANEL
Anion gap: 10 (ref 5–15)
BUN: 17 mg/dL (ref 6–20)
CO2: 25 mmol/L (ref 22–32)
Calcium: 9.2 mg/dL (ref 8.9–10.3)
Chloride: 102 mmol/L (ref 98–111)
Creatinine, Ser: 1.17 mg/dL (ref 0.61–1.24)
GFR calc Af Amer: >60 mL/min (ref >60)
GFR calc non Af Amer: >60 mL/min (ref >60)
Glucose, Bld: 96 mg/dL (ref 70–99)
Potassium: 4.7 mmol/L (ref 3.5–5.1)
Sodium: 137 mmol/L (ref 135–145)

## 2018-07-05 NOTE — Progress Notes (Signed)
[facility] Note   Referring Physician: PCP: Patient, No Pcp Per PCP-Cardiologist: Arvilla Meres, MD   HPI:  Kenneth [name] is a 43 y.o. male w/ h/o Chronic systolic CHF, Paroxysmal Afib, polysubstance abuse, and chronic suboxone use.  Admitted 12/15 - 06/04/18 with new onset systolic CHF. Cath with NICM, thought to be most likely due to combination of Afib with RVR and polysubstance abuse.  Medications adjusted as tolerated. He underwent TEE/DCCV 06/02/18 and maintained NSR to discharge.   He presents today for regular follow up. Last visit switched to [facility]. Has been feeling good overall. Back at work, but taking it easier. He took his last dose of all of his medications yesterday, so had none apart from [place] this am. Still struggling with anxiety, Hasn't made PCP appt yet. He denies edema, lightheadedness or dizziness. He denies any undue SOB, but as above is not over-taxing himself. Would like to talk to HFSW today.   Review of systems complete and found to be negative unless listed in HPI.    L/RHC 06/01/18 - Normal coronaries RA = 4 RV = 30/3 PA = 31/16 (21) PCW = 14 Fick cardiac output/index = 5.74/2.8 PVR = 1.2 WU Aosat = 95% PA sat = 70%, 73% SVC = 69%  Assessment: 1. Normal coronaries 2. Severe NICM EF 20% 3. Well compensated hemodynamics on milrinone 0.125 mc/kg/min  Past Medical History:  Diagnosis Date  . Frequent headaches   . Narcotic abuse (HCC)   . PTSD (post-traumatic stress disorder)     Current Outpatient Medications  Medication Sig Dispense Refill  . ALPRAZolam (XANAX) 1 MG tablet Take 1 mg by mouth daily as needed for anxiety.    Marland Kitchen amiodarone (PACERONE) 200 MG tablet Take 1 tablet (200 mg total) by mouth 2 (two) times daily. 60 tablet 0  . apixaban (ELIQUIS) 5 MG TABS tablet Take 1 tablet (5 mg total) by mouth 2 (two) times daily. 60 tablet 0  . digoxin (LANOXIN) 0.125 MG tablet Take 1 tablet (0.125 mg total) by  mouth daily. 30 tablet 0  . furosemide (LASIX) 40 MG tablet Take 1 tablet (40 mg total) by mouth as needed for fluid or edema. 30 tablet 0  . sacubitril-valsartan (ENTRESTO) 24-26 MG Take 1 tablet by mouth 2 (two) times daily. 60 tablet 3  . spironolactone (ALDACTONE) 25 MG tablet Take 1 tablet (25 mg total) by mouth daily. 30 tablet 0   No current facility-administered medications for this encounter.    No Known Allergies   Social History   Socioeconomic History  . Marital status: Married    Spouse name: Not on file  . Number of children: 2  . Years of education: Not on file  . Highest education level: Not on file  Occupational History  . Not on file  Social Needs  . Financial resource strain: Somewhat hard  . Food insecurity:    Worry: Sometimes true    Inability: Sometimes true  . Transportation needs:    Medical: No    Non-medical: No  Tobacco Use  . Smoking status: Current Some Day Smoker    Packs/day: 1.00    Years: 0.50    Pack years: 0.50    Types: Cigarettes  . Smokeless tobacco: Current User    Types: Snuff  Substance and Sexual Activity  . Alcohol use: No    Alcohol/week: 0.0 standard drinks  . Drug use: No    Types: Cocaine, Marijuana  . Sexual activity:  Yes    Birth control/protection: None  Lifestyle  . Physical activity:    Days per week: Not on file    Minutes per session: Not on file  . Stress: Not on file  Relationships  . Social connections:    Talks on phone: Not on file    Gets together: Not on file    Attends religious service: Not on file    Active member of club or organization: Not on file    Attends meetings of clubs or organizations: Not on file    Relationship status: Not on file  . Intimate partner violence:    Fear of current or ex partner: Not on file    Emotionally abused: Not on file    Physically abused: Not on file    Forced sexual activity: Not on file  Other Topics Concern  . Not on file  Social History Narrative    06/15/18- has been unable to work for 1 month due to medical issues- this has lead to some financial strain- sent SSI information, food stamps application, and food pantry list      Family History  Problem Relation Age of Onset  . Miscarriages / [place] Brother   . Schizophrenia Brother     Vitals:   07/06/18 1001  BP: (!) 138/98  Pulse: 84  Weight: 84.9 kg (187 lb 3.2 oz)    Wt Readings from Last 3 Encounters:  07/06/18 84.9 kg (187 lb 3.2 oz)  06/08/18 80.3 kg (177 lb)  06/04/18 80 kg (176 lb 5.9 oz)     PHYSICAL EXAM: General: Well appearing. No resp difficulty. HEENT: Normal Neck: Supple. JVP 5-6. Carotids 2+ bilat; no bruits. No thyromegaly or nodule noted. Cor: PMI nondisplaced. RRR, No M/G/R noted Lungs: CTAB, normal effort. Abdomen: Soft, non-tender, non-distended, no HSM. No bruits or masses. +BS  Extremities: No cyanosis, clubbing, or rash. R and LLE no edema.  Neuro: Alert & orientedx3, cranial nerves grossly intact. moves all 4 extremities w/o difficulty. Affect pleasant   ASSESSMENT & PLAN:  1. Chronic systolic CHF, NICM - ECHO 05/29/18 LVEF 20%.Likely in setting of chronic substance abuse and Afib RVR.  - NICM by cath 06/01/18. Compensated hemodynamics on low dose milrinone. - Milrinone stopped 12/21 - NYHA II symptoms - Volume status stable on exam.  - Increase Entresto to 49/51 mg BID.  - Continue lasix as needed only.  - Continue digoxin 0.125 mg daily. Level < 0.2 06/04/18 - Continue spiro 25 mg daily. BMET today.  - Add low dose coreg 3.125 mg BID cautiously.  - Reinforced fluid restriction to < 2 L daily, sodium restriction to less than 2000 mg daily, and the importance of daily weights.    2. Paroxysmal Afib  - Regular on exam.  - Continue amiodarone 200 mg daily.  -CHA2DS2-VAScis at least 2 (CHF, HTN). Unknown vascular disease. - Continue Eliquis 5mg  BID  3. Substance abuse - UDS 05/28/18 positive for opiates, BZDs, amphetamines,  and THC. - He is on Suboxone. It has been communicated that under no circumstances will the HF clinic address his Suboxone. No change.   4. Social - HFSW following for help with medications, transportation, or appointments prn. He is doing better, but needs to make PCP appt.   He is doing OK overall. Stressed need to establish with PCP for his other medical issues. Labs and Meds as above. RTC 4-6 weeks for continued med titration. Plan repeat Echo April/May for ICD consideration.  Graciella Freer, PA-C 07/06/18   Greater than 50% of the 25 minute visit was spent in counseling/coordination of care regarding disease state education, salt/fluid restriction, sliding scale diuretics, and medication compliance.

## 2018-07-06 ENCOUNTER — Ambulatory Visit (HOSPITAL_COMMUNITY)
Admission: RE | Admit: 2018-07-06 | Discharge: 2018-07-06 | Disposition: A | Discharge status: Home / Self Care | Payer: BLUE CROSS/BLUE SHIELD | Source: Ambulatory Visit | Attending: Cardiology | Admitting: Cardiology

## 2018-07-06 ENCOUNTER — Encounter (HOSPITAL_COMMUNITY): Payer: Self-pay

## 2018-07-06 ENCOUNTER — Other Ambulatory Visit: Payer: Self-pay

## 2018-07-06 VITALS — BP 138/98 | HR 84 | Wt 187.2 lb

## 2018-07-06 DIAGNOSIS — Z79899 Other long term (current) drug therapy: Secondary | ICD-10-CM | POA: Diagnosis not present

## 2018-07-06 DIAGNOSIS — I509 Heart failure, unspecified: Secondary | ICD-10-CM | POA: Diagnosis not present

## 2018-07-06 DIAGNOSIS — I480 Paroxysmal atrial fibrillation: Secondary | ICD-10-CM

## 2018-07-06 DIAGNOSIS — F4310 Post-traumatic stress disorder, unspecified: Secondary | ICD-10-CM | POA: Insufficient documentation

## 2018-07-06 DIAGNOSIS — I5022 Chronic systolic (congestive) heart failure: Secondary | ICD-10-CM | POA: Insufficient documentation

## 2018-07-06 DIAGNOSIS — Z7901 Long term (current) use of anticoagulants: Secondary | ICD-10-CM | POA: Diagnosis not present

## 2018-07-06 DIAGNOSIS — I428 Other cardiomyopathies: Secondary | ICD-10-CM | POA: Insufficient documentation

## 2018-07-06 DIAGNOSIS — I4891 Unspecified atrial fibrillation: Secondary | ICD-10-CM | POA: Diagnosis not present

## 2018-07-06 DIAGNOSIS — F1910 Other psychoactive substance abuse, uncomplicated: Secondary | ICD-10-CM | POA: Diagnosis not present

## 2018-07-06 DIAGNOSIS — I110 Hypertensive heart disease with heart failure: Secondary | ICD-10-CM | POA: Diagnosis not present

## 2018-07-06 DIAGNOSIS — F17210 Nicotine dependence, cigarettes, uncomplicated: Secondary | ICD-10-CM | POA: Insufficient documentation

## 2018-07-06 MED ORDER — SPIRONOLACTONE 25 MG PO TABS: 25.0000 mg | ORAL_TABLET | Freq: Every day | ORAL | 6 refills | Status: DC

## 2018-07-06 MED ORDER — SACUBITRIL-VALSARTAN 49-51 MG PO TABS: 1.0000 | ORAL_TABLET | Freq: Two times a day (BID) | ORAL | 6 refills | Status: DC

## 2018-07-06 MED ORDER — AMIODARONE HCL 200 MG PO TABS: 200.0000 mg | ORAL_TABLET | Freq: Two times a day (BID) | ORAL | 6 refills | Status: DC

## 2018-07-06 MED ORDER — FUROSEMIDE 40 MG PO TABS: 40.0000 mg | ORAL_TABLET | ORAL | 6 refills | Status: AC | PRN

## 2018-07-06 MED ORDER — DIGOXIN 125 MCG PO TABS: 0.1250 mg | ORAL_TABLET | Freq: Every day | ORAL | 6 refills | Status: DC

## 2018-07-06 MED ORDER — CARVEDILOL 3.125 MG PO TABS: 3.1250 mg | ORAL_TABLET | Freq: Two times a day (BID) | ORAL | 6 refills | Status: DC

## 2018-07-06 MED ORDER — APIXABAN 5 MG PO TABS: 5.0000 mg | ORAL_TABLET | Freq: Two times a day (BID) | ORAL | 6 refills | Status: DC

## 2018-07-06 NOTE — Addendum Note (Signed)
Encounter addended by: Burna Sis, LCSW on: 07/06/2018 12:07 PM  Actions taken: Clinical Note Signed

## 2018-07-06 NOTE — Patient Instructions (Addendum)
Labs were done today. We will call you with any ABNORMAL results. No news is good news!  BEGIN taking Coreg 3.125mg  (1 tab) twice a day.  INCREASE Entresto to 49/51mg  (1 tab) twice a day.  All refill prescriptions have been sent to your pharmacy!  Your physician recommends that you schedule a follow-up appointment in 4 weeks.  Your physician has requested that you have an echocardiogram. Echocardiography is a painless test that uses sound waves to create images of your heart. It provides your doctor with information about the size and shape of your heart and how well your heart's chambers and valves are working. This procedure takes approximately one hour. There are no restrictions for this procedure.  Your physician recommends that you schedule a follow-up appointment in 3 months, your ECHO will be done the same day.

## 2018-07-06 NOTE — Progress Notes (Signed)
CSW consulted to help pt get PCP.  CSW met with pt and provided with local list of PCP offices- informed him he needed to call and confirm they are in network with his insurance provider.  Pt also inquired about disability- CSW discussed methods to apply for disability and provided pt with CSW number in case pt has further questions.  CSW will continue to follow and assist as needed.  Jorge Ny, LCSW Clinical Social Worker [facility] [phone]

## 2018-07-07 ENCOUNTER — Other Ambulatory Visit (HOSPITAL_COMMUNITY): Payer: Self-pay

## 2018-07-07 MED ORDER — SPIRONOLACTONE 25 MG PO TABS: 25.0000 mg | ORAL_TABLET | Freq: Every day | ORAL | 6 refills | Status: DC

## 2018-07-07 MED ORDER — DIGOXIN 125 MCG PO TABS: 0.1250 mg | ORAL_TABLET | Freq: Every day | ORAL | 6 refills | Status: DC

## 2018-07-07 MED ORDER — APIXABAN 5 MG PO TABS: 5.0000 mg | ORAL_TABLET | Freq: Two times a day (BID) | ORAL | 6 refills | Status: DC

## 2018-07-20 ENCOUNTER — Encounter (HOSPITAL_COMMUNITY): Payer: Self-pay | Admitting: *Deleted

## 2018-07-20 ENCOUNTER — Other Ambulatory Visit: Payer: Self-pay

## 2018-07-20 ENCOUNTER — Emergency Department (HOSPITAL_COMMUNITY): Payer: BLUE CROSS/BLUE SHIELD

## 2018-07-20 ENCOUNTER — Emergency Department (HOSPITAL_COMMUNITY)
Admission: EM | Admit: 2018-07-20 | Discharge: 2018-07-20 | Disposition: A | Discharge status: Home / Self Care | Payer: BLUE CROSS/BLUE SHIELD | Attending: Emergency Medicine | Admitting: Emergency Medicine

## 2018-07-20 DIAGNOSIS — I509 Heart failure, unspecified: Secondary | ICD-10-CM | POA: Diagnosis not present

## 2018-07-20 DIAGNOSIS — F17210 Nicotine dependence, cigarettes, uncomplicated: Secondary | ICD-10-CM | POA: Insufficient documentation

## 2018-07-20 DIAGNOSIS — Z7901 Long term (current) use of anticoagulants: Secondary | ICD-10-CM | POA: Insufficient documentation

## 2018-07-20 DIAGNOSIS — R42 Dizziness and giddiness: Secondary | ICD-10-CM | POA: Insufficient documentation

## 2018-07-20 DIAGNOSIS — Z79899 Other long term (current) drug therapy: Secondary | ICD-10-CM | POA: Insufficient documentation

## 2018-07-20 DIAGNOSIS — G43119 Migraine with aura, intractable, without status migrainosus: Secondary | ICD-10-CM | POA: Insufficient documentation

## 2018-07-20 DIAGNOSIS — R51 Headache: Secondary | ICD-10-CM | POA: Diagnosis not present

## 2018-07-20 HISTORY — DX: Unspecified atrial fibrillation: I48.91

## 2018-07-20 LAB — BASIC METABOLIC PANEL
Anion gap: 11 (ref 5–15)
BUN: 13 mg/dL (ref 6–20)
CO2: 28 mmol/L (ref 22–32)
Calcium: 9.3 mg/dL (ref 8.9–10.3)
Chloride: 99 mmol/L (ref 98–111)
Creatinine, Ser: 1.13 mg/dL (ref 0.61–1.24)
GFR calc Af Amer: >60 mL/min (ref >60)
GFR calc non Af Amer: >60 mL/min (ref >60)
Glucose, Bld: 109 mg/dL — ABNORMAL HIGH (ref 70–99)
Potassium: 4.3 mmol/L (ref 3.5–5.1)
SODIUM: 138 mmol/L (ref 135–145)

## 2018-07-20 LAB — CBC
HCT: 41.8 % (ref 39.0–52.0)
Hemoglobin: 14 g/dL (ref 13.0–17.0)
MCH: 28.9 pg (ref 26.0–34.0)
MCHC: 33.5 g/dL (ref 30.0–36.0)
MCV: 86.4 fL (ref 80.0–100.0)
Platelets: 277 10*3/uL (ref 150–400)
RBC: 4.84 MIL/uL (ref 4.22–5.81)
RDW: 12.8 % (ref 11.5–15.5)
WBC: 6.5 10*3/uL (ref 4.0–10.5)
nRBC: 0 % (ref 0.0–0.2)

## 2018-07-20 LAB — TSH: TSH: 6.533 u[IU]/mL — ABNORMAL HIGH (ref 0.350–4.500)

## 2018-07-20 LAB — T4, FREE: FREE T4: 1.08 ng/dL (ref 0.82–1.77)

## 2018-07-20 LAB — I-STAT TROPONIN, ED: TROPONIN I, POC: 0 ng/mL (ref 0.00–0.08)

## 2018-07-20 MED ORDER — DIPHENHYDRAMINE HCL 25 MG PO CAPS
25.0000 mg | ORAL_CAPSULE | Freq: Once | ORAL | Status: AC
  Administered 2018-07-20: 25 mg via ORAL
  Filled 2018-07-20: qty 1

## 2018-07-20 MED ORDER — SODIUM CHLORIDE 0.9% FLUSH: 3.0000 mL | Freq: Once | INTRAVENOUS | Status: DC

## 2018-07-20 MED ORDER — PROCHLORPERAZINE EDISYLATE 10 MG/2ML IJ SOLN
10.0000 mg | Freq: Once | INTRAMUSCULAR | Status: AC
  Administered 2018-07-20: 10 mg via INTRAVENOUS
  Filled 2018-07-20: qty 2

## 2018-07-20 NOTE — ED Triage Notes (Signed)
Pt was working on his [employer] today, was bent down for over an hour. When pt stood up started feeling like his heart was racing, felt dizzy, and has been having a migraine since event. Reports vision "feels like I am looking through a kaleidoscope." Pt was hx of similar in the past. Reports last time he had an aura with migraine he was diagnosed with heart failure and afib; has been under a lot more stress recently

## 2018-07-20 NOTE — ED Provider Notes (Signed)
[facility] EMERGENCY DEPARTMENT Provider Note   CSN: [id number] Arrival date & time: 07/20/18  2057     History   Chief Complaint No chief complaint on file.   HPI Kenneth [name] is a 43 y.o. male.  HPI  Kenneth [name] is a 43 y.o. male with PMH of fibrillation on Eliquis, amiodarone, carvedilol, narcotic abuse, PTSD who presents with his significant other at the bedside with headache, vision change, and lightheadedness.  Patient states that for the last 2 to 3 days he has had building photophobia that worsened today.  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  He did not syncopize.  He developed flashing colors and lights in his bilateral eyes, worse on the right than the left at that time with a posterior occipital headache on the right.  He states this is identical to his long history of migraines with aura.  This is the same aura that he develops either immediately before or concomitantly with onset of headache.  However, he states last time he had this he was diagnosed with A. fib and heart failure and is concerned that he is having the same.  Additionally, notes about a 4 pound weight gain in the last 3 or 4 days but states that he is within his normal weight range and does not feel very volume overloaded.  However, he did feel his abdomen was slightly distended this morning.  He took an additional p.o. Lasix at around 4 PM today and has urinated a significant amount since then.  When he stood up after working and became lightheaded it was around 4 hours after taking his extra dose of Lasix.  He feels improved at this time but still has vision changes in the right eye and a headache.  States he is very anxious that he has back in heart failure.  Past Medical History:  Diagnosis Date  . A-fib (HCC)   . Frequent headaches   . Narcotic abuse (HCC)   . PTSD (post-traumatic stress disorder)      Patient Active Problem List   Diagnosis Date Noted  . Atrial fibrillation with rapid ventricular response (HCC) 05/29/2018  . New onset a-fib (HCC) 05/28/2018  . Acute CHF (congestive heart failure) (HCC) 05/28/2018  . Methamphetamine abuse (HCC) 05/28/2018  . PTSD (post-traumatic stress disorder) 05/20/2015  . 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 Frequent headaches 05/20/2015    Past Surgical History:  Procedure Laterality Date  . CARDIOVERSION N/A 06/02/2018   Procedure: CARDIOVERSION;  Surgeon: Dolores Patty, MD;  Location: [facility] ENDOSCOPY;  Service: Cardiovascular;  Laterality: N/A;  . RIGHT/LEFT HEART CATH AND CORONARY ANGIOGRAPHY N/A 06/01/2018   Procedure: RIGHT/LEFT HEART CATH AND CORONARY ANGIOGRAPHY;  Surgeon: Dolores Patty, MD;  Location: [facility] INVASIVE CV LAB;  Service: Cardiovascular;  Laterality: N/A;  . TEE WITHOUT CARDIOVERSION N/A 06/02/2018   Procedure: TRANSESOPHAGEAL ECHOCARDIOGRAM (TEE);  Surgeon: Dolores Patty, MD;  Location: [facility] ENDOSCOPY;  Service: Cardiovascular;  Laterality: N/A;  . TONSILLECTOMY          Home Medications    Prior to Admission medications   Medication Sig Start Date End Date Taking? Authorizing Provider  ALPRAZolam Prudy Feeler) 1 MG tablet Take 1 mg by mouth daily as needed for anxiety.    [provider]  amiodarone (PACERONE) 200 MG tablet Take 1 tablet (200 mg total) by mouth 2 (  two) times daily. 07/06/18   Graciella Freer, PA-C  apixaban (ELIQUIS) 5 MG TABS tablet Take 1 tablet (5 mg total) by mouth 2 (two) times daily. 07/07/18   Bensimhon, Bevelyn Buckles, MD  carvedilol (COREG) 3.125 MG tablet Take 1 tablet (3.125 mg total) by mouth 2 (two) times daily with a meal. 07/06/18   Tillery, Mariam Dollar, PA-C  digoxin (LANOXIN) 0.125 MG tablet Take 1 tablet (0.125 mg total) by mouth daily. 07/07/18   Bensimhon, Bevelyn Buckles, MD  furosemide (LASIX) 40 MG tablet Take 1 tablet (40 mg total) by mouth as needed for  fluid or edema. 07/06/18   Graciella Freer, PA-C  sacubitril-valsartan (ENTRESTO) 49-51 MG Take 1 tablet by mouth 2 (two) times daily. 07/06/18   Graciella Freer, PA-C  spironolactone (ALDACTONE) 25 MG tablet Take 1 tablet (25 mg total) by mouth daily. 07/07/18   Bensimhon, Bevelyn Buckles, MD    Family History Family History  Problem Relation Age of Onset  . Miscarriages / [place] Brother   . Schizophrenia Brother     Social History Social History   Tobacco Use  . Smoking status: Current Some Day Smoker    Packs/day: 1.00    Years: 0.50    Pack years: 0.50    Types: Cigarettes  . Smokeless tobacco: Current User    Types: Snuff  Substance Use Topics  . Alcohol use: No    Alcohol/week: 0.0 standard drinks  . Drug use: No    Types: Cocaine, Marijuana     Allergies   Patient has no known allergies.   Review of Systems Review of Systems  Constitutional: Negative for activity change, appetite change, chills, fever and unexpected weight change.  HENT: Negative for ear pain and sore throat.   Eyes: Positive for photophobia and visual disturbance. Negative for pain.  Respiratory: Positive for shortness of breath. Negative for cough.   Cardiovascular: Negative for chest pain, palpitations and leg swelling.  Gastrointestinal: Positive for abdominal distention. Negative for abdominal pain and vomiting.  Genitourinary: Negative for dysuria and hematuria.  Musculoskeletal: Negative for arthralgias and back pain.  Skin: Negative for color change and rash.  Neurological: Positive for light-headedness and headaches. Negative for seizures and syncope.  Psychiatric/Behavioral: The patient is nervous/anxious.   All other systems reviewed and are negative.    Physical Exam Updated Vital Signs BP 104/62   Pulse 68   Temp 97.9 F (36.6 C) (Oral)   Resp 11   SpO2 97%   Physical Exam Vitals signs and nursing note reviewed.  Constitutional:      Appearance: Normal  appearance. He is well-developed. He is not ill-appearing.  HENT:     Head: Normocephalic and atraumatic.     Jaw: There is normal jaw occlusion.  Eyes:     General: Lids are normal. Vision grossly intact.     Extraocular Movements: Extraocular movements intact.     Conjunctiva/sclera: Conjunctivae normal.     Right eye: No chemosis.    Left eye: No chemosis.    Pupils: Pupils are equal, round, and reactive to light.     Visual Fields: Right eye visual fields normal and left eye visual fields normal.     Comments: Normal visual fields and visual acuity bilaterally.  Neck:     Musculoskeletal: Neck supple.  Cardiovascular:     Rate and Rhythm: Normal rate and regular rhythm.     Heart sounds: S1 normal and S2 normal. No murmur.  Pulmonary:  Effort: Pulmonary effort is normal. No tachypnea or respiratory distress.     Breath sounds: Normal breath sounds. No decreased breath sounds, wheezing or rhonchi.  Abdominal:     General: There is no distension.     Palpations: Abdomen is soft.     Tenderness: There is no abdominal tenderness. There is no guarding or rebound. Negative signs include Murphy's sign.  Musculoskeletal:        General: No swelling or tenderness.     Right lower leg: No edema.     Left lower leg: No edema.  Skin:    General: Skin is warm and dry.  Neurological:     General: No focal deficit present.     Mental Status: He is alert and oriented to person, place, and time.     GCS: GCS eye subscore is 4. GCS verbal subscore is 5. GCS motor subscore is 6.     Cranial Nerves: Cranial nerves are intact.     Sensory: Sensation is intact.     Motor: Motor function is intact.     Coordination: Coordination is intact.  Psychiatric:        Mood and Affect: Mood is anxious.        Behavior: Behavior is cooperative.      ED Treatments / Results  Labs (all labs ordered are listed, but only abnormal results are displayed) Labs Reviewed  BASIC METABOLIC PANEL -  Abnormal; Notable for the following components:      Result Value   Glucose, Bld 109 (*)    All other components within normal limits  TSH - Abnormal; Notable for the following components:   TSH 6.533 (*)    All other components within normal limits  CBC  T4, FREE  I-STAT TROPONIN, ED    EKG EKG Interpretation  Date/Time:  Thursday July 20 2018 22:13:24 EST Ventricular Rate:  63 PR Interval:  222 QRS Duration: 112 QT Interval:  440 QTC Calculation: 451 R Axis:   95 Text Interpretation:  Sinus rhythm Prolonged PR interval Anterior infarct, old Confirmed by Jacalyn Lefevre [phone]) on 07/20/2018 10:29:33 PM   Radiology Dg Chest Portable 1 View  Result Date: 07/20/2018 CLINICAL DATA:  Migraines tonight. History of atrial fibrillation, CHF, mild dyspnea now resolved. Smoker. EXAM: PORTABLE CHEST 1 VIEW COMPARISON:  05/30/2018 FINDINGS: Mild cardiac enlargement. No vascular congestion. Lungs are clear. Previous congestive changes and edema have resolved. No pleural effusions. No pneumothorax. Calcified granulomas in the right lung with calcified right mediastinal lymph nodes. Mediastinal contours appear intact. IMPRESSION: Cardiac enlargement. No evidence of active pulmonary disease. Electronically Signed   By: Burman Nieves M.D.   On: 07/20/2018 22:01    Procedures Procedures (including critical care time)  Medications Ordered in ED Medications  sodium chloride flush (NS) 0.9 % injection 3 mL (has no administration in time range)  prochlorperazine (COMPAZINE) injection 10 mg (10 mg Intravenous Given 07/20/18 2221)  diphenhydrAMINE (BENADRYL) capsule 25 mg (25 mg Oral Given 07/20/18 2221)     Initial Impression / Assessment and Plan / ED Course  I have reviewed the triage vital signs and the nursing notes.  Pertinent labs & imaging results that were available during my care of the patient were reviewed by me and considered in my medical decision making (see chart for  details).     MDM:  Imaging: Chest x-ray showing cardiac enlargement with no evidence for active pulmonary disease.  ED Provider Interpretation of EKG: Sinus rhythm with  a rate around 80 bpm, normal axis, no ST segment elevations or depressions, pathologic T wave abnormalities or interval regularity with exception of prolonged PR interval at 222.  EKG quality is poor.  Repeat EKG with sinus rhythm with a rate of 63 bpm, normal axis and no ST segment elevations or depressions or pathologic T wave abnormalities.  Intervals are normal.  Poor R wave progression in the anterior leads.  Biphasic T wave in lead V5 unchanged from prior EKGs.  Labs: CBC and BMP normal.  Troponin negative.  TSH slightly elevated. Free T4 1.08  On initial evaluation, patient appears stable but anxious. Afebrile and hemodynamically stable. Alert and oriented x4, pleasant, and cooperative.  Patient presents with headache, seeing flashing colors in his vision, and lightheadedness after standing as detailed above.  On exam, patient has no focal abnormality.  Heart and lungs normal.  No increased work of breathing.  EKG showed sinus rhythm with no tachycardia or significant arrhythmia.  Intervals largely normal with exception of first-degree AV block.  No ischemic changes.  He does not appear volume overloaded at this time.  Chest x-ray unremarkable as above.  Troponin ordered prior to my evaluation was negative.  Doubt ACS as patient has not had chest pain.  No indication for BNP at this time.  Neurologic exam is normal.  However, patient did note that he felt he could not see out of his right eye on initial conversation.  When he was told his EKG did not show any significant evidence for ischemia or arrhythmia he stated his vision normalized immediately.  He still had persistent right occipital headache.  He never had pain in his eye or external change in its appearance.  Acuity normal on my exam and visual fields normal.   Extraocular movements normal.  Pupils normal.    He has been compliant with all his medications including his anticoagulants.  Low suspicion for amaurosis fugax, optic neuritis, or other vision threatening pathology including temporal arteritis.  Patient states his symptoms are identical with his prior history of migraines with aura and feel that his diuresis and then standing rapidly precipitated it.  Additionally, he has had 2 to 3 days of building photophobia which he states is identical to preceding symptoms he has had with this is same migraine with aura.  Headache was not thunderclap in onset and worsened gradually after standing up.  Not the worst headache of his life.  Normal neuro exam and mild headache at this time with no neck pain.  Doubt subarachnoid hemorrhage.  No indication for CT head.  No indication for CTA.  Given IV Compazine and p.o. Benadryl for headache with resolution.  Labs otherwise unremarkable as detailed above.  Patient feels normal and is appropriate for outpatient follow-up with DC home.  Given return precautions.  The plan for this patient was discussed with Dr. Particia NearingHaviland who voiced agreement and who oversaw evaluation and treatment of this patient.   The patient was fully informed and involved with the history taking, evaluation, workup including labs/images, and plan. The patient's concerns and questions were addressed to the patient's satisfaction and he expressed agreement with the plan to DC home.    Final Clinical Impressions(s) / ED Diagnoses   Final diagnoses:  Intractable migraine with aura without status migrainosus  Lightheaded    ED Discharge Orders    None       Janeah Kovacich, Sherryle LisJames F II, MD 07/20/18 [id number]    Jacalyn LefevreHaviland, Julie, MD 07/20/18 2348

## 2018-08-03 NOTE — Progress Notes (Signed)
Advanced Heart Failure Clinic Note   Referring Physician: PCP: Patient, No Pcp Per PCP-Cardiologist: Arvilla Meres, MD   HPI:  Kenneth [name] is a 43 y.o. male w/ h/o Chronic systolic CHF, Paroxysmal Afib, polysubstance abuse, and chronic suboxone use.  Admitted 12/15 - 06/04/18 with new onset systolic CHF. Cath with NICM, thought to be most likely due to combination of Afib with RVR and polysubstance abuse.  Medications adjusted as tolerated. He underwent TEE/DCCV 06/02/18 and maintained NSR to discharge.   He presents today for 1 month follow up. Last visit coreg was added and Entresto was increased. He was seen in ED 07/20/18 with a migraine. Overall doing okay. He has been dizzy with standing since last medication changes. He gets tunnel vision and feels like he is going to pass out. No SOB. Can walk around in stores with no breaks. Fatigue much improved. He feels back to normal. No edema, orthopnea, or PND. No cough. Has chest pain occasionally on left side. Seems to occur after eating spicy foods. Not exertional. Occasional nausea with it. Anxiety is really bad. He is taking xanax, but has abstained from any other street drugs. Appetite down the last 2 days, but picking back up. No bleeding on eliquis. Taking all medications. Weight down 5 lbs on our scale. Weights 177-180 lbs on his home scale. Took lasix x1 with weight gain, which was the day he went to ED.  Review of systems complete and found to be negative unless listed in HPI.   L/RHC 06/01/18 - Normal coronaries RA = 4 RV = 30/3 PA = 31/16 (21) PCW = 14 Fick cardiac output/index = 5.74/2.8 PVR = 1.2 WU Aosat = 95% PA sat = 70%, 73% SVC = 69%  Assessment: 1. Normal coronaries 2. Severe NICM EF 20% 3. Well compensated hemodynamics on milrinone 0.125 mc/kg/min  Past Medical History:  Diagnosis Date  . A-fib (HCC)   . Frequent headaches   . Narcotic abuse (HCC)   . PTSD (post-traumatic stress disorder)      Current Outpatient Medications  Medication Sig Dispense Refill  . ALPRAZolam (XANAX) 1 MG tablet Take 1 mg by mouth daily as needed for anxiety.    Marland Kitchen amiodarone (PACERONE) 200 MG tablet Take 1 tablet (200 mg total) by mouth 2 (two) times daily. (Patient taking differently: Take 200 mg by mouth daily. ) 60 tablet 6  . apixaban (ELIQUIS) 5 MG TABS tablet Take 1 tablet (5 mg total) by mouth 2 (two) times daily. 60 tablet 6  . carvedilol (COREG) 3.125 MG tablet Take 1 tablet (3.125 mg total) by mouth 2 (two) times daily with a meal. 60 tablet 6  . digoxin (LANOXIN) 0.125 MG tablet Take 1 tablet (0.125 mg total) by mouth daily. 30 tablet 6  . furosemide (LASIX) 40 MG tablet Take 1 tablet (40 mg total) by mouth as needed for fluid or edema. 30 tablet 6  . sacubitril-valsartan (ENTRESTO) 49-51 MG Take 1 tablet by mouth 2 (two) times daily. 60 tablet 6  . spironolactone (ALDACTONE) 25 MG tablet Take 1 tablet (25 mg total) by mouth daily. 30 tablet 6   No current facility-administered medications for this encounter.    No Known Allergies   Social History   Socioeconomic History  . Marital status: Married    Spouse name: Not on file  . Number of children: 2  . Years of education: Not on file  . Highest education level: Not on file  Occupational History  .  Not on file  Social Needs  . Financial resource strain: Somewhat hard  . Food insecurity:    Worry: Sometimes true    Inability: Sometimes true  . Transportation needs:    Medical: No    Non-medical: No  Tobacco Use  . Smoking status: Current Some Day Smoker    Packs/day: 1.00    Years: 0.50    Pack years: 0.50    Types: Cigarettes  . Smokeless tobacco: Current User    Types: Snuff  Substance and Sexual Activity  . Alcohol use: No    Alcohol/week: 0.0 standard drinks  . Drug use: No    Types: Cocaine, Marijuana  . Sexual activity: Yes    Birth control/protection: None  Lifestyle  . Physical activity:    Days per week:  Not on file    Minutes per session: Not on file  . Stress: Not on file  Relationships  . Social connections:    Talks on phone: Not on file    Gets together: Not on file    Attends religious service: Not on file    Active member of club or organization: Not on file    Attends meetings of clubs or organizations: Not on file    Relationship status: Not on file  . Intimate partner violence:    Fear of current or ex partner: Not on file    Emotionally abused: Not on file    Physically abused: Not on file    Forced sexual activity: Not on file  Other Topics Concern  . Not on file  Social History Narrative   06/15/18- has been unable to work for 1 month due to medical issues- this has lead to some financial strain- sent SSI information, food stamps application, and food pantry list      Family History  Problem Relation Age of Onset  . Miscarriages / [place] Brother   . Schizophrenia Brother     Vitals:   08/04/18 1011  BP: 98/72  Pulse: 65  SpO2: 99%  Weight: 82.5 kg (181 lb 12.8 oz)   Orthostatics: 98/72 sitting 110/90 standing  Wt Readings from Last 3 Encounters:  08/04/18 82.5 kg (181 lb 12.8 oz)  07/06/18 84.9 kg (187 lb 3.2 oz)  06/08/18 80.3 kg (177 lb)     PHYSICAL EXAM: General: Appears anxious. No resp difficulty. HEENT: Normal Neck: Supple. No JVD. Carotids 2+ bilat; no bruits. No thyromegaly or nodule noted. Cor: PMI nondisplaced. RRR, No M/G/R noted Lungs: CTAB, normal effort. Abdomen: Soft, non-tender, non-distended, no HSM. No bruits or masses. +BS  Extremities: No cyanosis, clubbing, or rash. R and LLE no edema.  Neuro: Alert & orientedx3, cranial nerves grossly intact. moves all 4 extremities w/o difficulty. Affect pleasant   ASSESSMENT & PLAN:  1. Chronic systolic CHF, NICM - ECHO 05/29/18 LVEF 20%.Likely in setting of chronic substance abuse and Afib RVR.  - NICM by cath 06/01/18. Compensated hemodynamics on low dose milrinone. - Milrinone  stopped 12/21 - NYHA I-II symptoms - Volume status dry on exam. ReDS: 31% - Continue lasix as needed only. He did poorly taking 40 mg once. I advised he try 20 mg if he needs it.  - Decrease Entresto to 24/26 mg BID with severe dizziness, tunnel vision, and presyncope. BP now on lower side, but orthostatics are negative. BMET today.  - Continue digoxin 0.125 mg daily. Level < 0.2 06/04/18 - Continue spiro 25 mg daily. - Continue coreg 3.125 mg BID - Reinforced  fluid restriction to < 2 L daily, sodium restriction to less than 2000 mg daily, and the importance of daily weights. - Repeat echo scheduled for April.     2. Paroxysmal Afib  - Regular on exam.  - Continue amiodarone 200 mg daily. TSH mildly elevated 07/20/18, but T4 normal. Monitor closely. AST mildly up (45), ALT normal (06/02/18). Recheck TFTs and LFTs today.  -CHA2DS2-VAScis at least 2 (CHF, HTN). Unknown vascular disease. - Continue Eliquis 5mg  BID. Denies bleeding.   3. Substance abuse - UDS 05/28/18 positive for opiates, BZDs, amphetamines, and THC. - He is on Suboxone. It has been communicated that under no circumstances will the HF clinic address his Suboxone. No change.   4. Social - HFSW following for help with medications, transportation, or appointments prn. He is doing better, but needs to make PCP appt. CSW to see today.   5. Chest pain - Non-exertional. It occurs after eating spicy foods or when he is anxious.  - LHC 05/2018 with normal coronaries. I do not think this is cardiac related.   Keep follow up with Dr Gala Romney in April with repeat echo.   Alford Highland, NP 08/04/18   Greater than 50% of the 25 minute visit was spent in counseling/coordination of care regarding disease state education, salt/fluid restriction, sliding scale diuretics, and medication compliance.

## 2018-08-04 ENCOUNTER — Ambulatory Visit (HOSPITAL_COMMUNITY)
Admission: RE | Admit: 2018-08-04 | Discharge: 2018-08-04 | Disposition: A | Discharge status: Home / Self Care | Payer: BLUE CROSS/BLUE SHIELD | Source: Ambulatory Visit | Attending: Internal Medicine | Admitting: Internal Medicine

## 2018-08-04 VITALS — BP 98/72 | HR 65 | Wt 181.8 lb

## 2018-08-04 DIAGNOSIS — I4891 Unspecified atrial fibrillation: Secondary | ICD-10-CM

## 2018-08-04 DIAGNOSIS — F17210 Nicotine dependence, cigarettes, uncomplicated: Secondary | ICD-10-CM | POA: Diagnosis not present

## 2018-08-04 DIAGNOSIS — I509 Heart failure, unspecified: Secondary | ICD-10-CM

## 2018-08-04 DIAGNOSIS — R079 Chest pain, unspecified: Secondary | ICD-10-CM | POA: Insufficient documentation

## 2018-08-04 DIAGNOSIS — Z7901 Long term (current) use of anticoagulants: Secondary | ICD-10-CM | POA: Insufficient documentation

## 2018-08-04 DIAGNOSIS — I110 Hypertensive heart disease with heart failure: Secondary | ICD-10-CM | POA: Diagnosis not present

## 2018-08-04 DIAGNOSIS — F1910 Other psychoactive substance abuse, uncomplicated: Secondary | ICD-10-CM

## 2018-08-04 DIAGNOSIS — I428 Other cardiomyopathies: Secondary | ICD-10-CM | POA: Insufficient documentation

## 2018-08-04 DIAGNOSIS — F4310 Post-traumatic stress disorder, unspecified: Secondary | ICD-10-CM | POA: Diagnosis not present

## 2018-08-04 DIAGNOSIS — Z79899 Other long term (current) drug therapy: Secondary | ICD-10-CM | POA: Diagnosis not present

## 2018-08-04 DIAGNOSIS — I5022 Chronic systolic (congestive) heart failure: Secondary | ICD-10-CM | POA: Diagnosis not present

## 2018-08-04 DIAGNOSIS — I480 Paroxysmal atrial fibrillation: Secondary | ICD-10-CM | POA: Insufficient documentation

## 2018-08-04 LAB — T4, FREE: Free T4: 1.12 ng/dL (ref 0.82–1.77)

## 2018-08-04 LAB — COMPREHENSIVE METABOLIC PANEL
ALT: 20 U/L (ref 0–44)
AST: 30 U/L (ref 15–41)
Albumin: 3.8 g/dL (ref 3.5–5.0)
Alkaline Phosphatase: 57 U/L (ref 38–126)
Anion gap: 7 (ref 5–15)
BUN: 10 mg/dL (ref 6–20)
CO2: 27 mmol/L (ref 22–32)
Calcium: 9 mg/dL (ref 8.9–10.3)
Chloride: 105 mmol/L (ref 98–111)
Creatinine, Ser: 1.03 mg/dL (ref 0.61–1.24)
GFR calc Af Amer: >60 mL/min (ref >60)
GFR calc non Af Amer: >60 mL/min (ref >60)
Glucose, Bld: 122 mg/dL — ABNORMAL HIGH (ref 70–99)
Potassium: 4.2 mmol/L (ref 3.5–5.1)
Sodium: 139 mmol/L (ref 135–145)
TOTAL PROTEIN: 6.5 g/dL (ref 6.5–8.1)
Total Bilirubin: 0.8 mg/dL (ref 0.3–1.2)

## 2018-08-04 LAB — TSH: TSH: 3.528 u[IU]/mL (ref 0.350–4.500)

## 2018-08-04 MED ORDER — SACUBITRIL-VALSARTAN 24-26 MG PO TABS: 1.0000 | ORAL_TABLET | Freq: Two times a day (BID) | ORAL | 6 refills | Status: DC

## 2018-08-04 NOTE — Progress Notes (Signed)
CSW met with patient in the clinic to assist with PCP. Patient shared he has had PTSD for years due to a traumatic event in 2004. "I get panic attacks and the only thing that helps is xanax". He noted that he "gets the xanax off the street". Patient became tearful during conversation stating he also had some traumatic events during his childhood. Patient also stated that he saw a psychiatrist when it happened in 2004 but has not had any ongoing treatment for PTSD nor on any prescribed medications. CSW provided information on PCP and as well as [facility] his [facility]. CSW also provided counseling around issues of coping and follow up with recommended referrals. Patient verbalizes understanding and follow up. Patient appears motivated for help and plans to reach out if further assistance needed. CSW available as needed. Raquel Sarna, [place], [place]

## 2018-08-04 NOTE — Progress Notes (Signed)
ReDS Vest - 08/04/18 1000      ReDS Vest   Estimated volume prior to reading  Low    Fitting Posture  Sitting    Height Marker  Tall    Ruler Value  29    Center Strip  Aligned    ReDS Value  31

## 2018-08-04 NOTE — Patient Instructions (Signed)
Lab work done today. We will contact you for any abnormal lab work.  DECREASE Entresto 24-26 (1 tab) twice daily  Keep follow up appointment with Dr. Gala Romney

## 2018-08-05 LAB — T3, FREE: T3 FREE: 2.9 pg/mL (ref 2.0–4.4)

## 2018-09-27 ENCOUNTER — Telehealth (HOSPITAL_COMMUNITY): Payer: Self-pay | Admitting: Vascular Surgery

## 2018-09-27 NOTE — Telephone Encounter (Signed)
Left pt message to inform: PT appt canceled due to Covid -19 , pt will be put on list of pt to call back for later appt,

## 2018-10-06 ENCOUNTER — Ambulatory Visit (HOSPITAL_COMMUNITY): Payer: BLUE CROSS/BLUE SHIELD

## 2018-10-06 ENCOUNTER — Encounter (HOSPITAL_COMMUNITY): Payer: Self-pay | Admitting: Internal Medicine

## 2018-11-13 ENCOUNTER — Encounter (HOSPITAL_COMMUNITY): Payer: Self-pay

## 2018-11-13 ENCOUNTER — Emergency Department (HOSPITAL_COMMUNITY)
Admission: EM | Admit: 2018-11-13 | Discharge: 2018-11-13 | Disposition: A | Discharge status: Home / Self Care | Payer: BC Managed Care – PPO | Attending: Emergency Medicine | Admitting: Emergency Medicine

## 2018-11-13 ENCOUNTER — Emergency Department (HOSPITAL_COMMUNITY): Payer: BC Managed Care – PPO

## 2018-11-13 ENCOUNTER — Ambulatory Visit (HOSPITAL_COMMUNITY)
Admission: EM | Admit: 2018-11-13 | Discharge: 2018-11-13 | Disposition: A | Discharge status: Other Acute Inpatient Hospital | Payer: BC Managed Care – PPO | Source: Home / Self Care

## 2018-11-13 ENCOUNTER — Other Ambulatory Visit: Payer: Self-pay

## 2018-11-13 DIAGNOSIS — Z5321 Procedure and treatment not carried out due to patient leaving prior to being seen by health care provider: Secondary | ICD-10-CM | POA: Insufficient documentation

## 2018-11-13 DIAGNOSIS — Y929 Unspecified place or not applicable: Secondary | ICD-10-CM | POA: Diagnosis not present

## 2018-11-13 DIAGNOSIS — Y999 Unspecified external cause status: Secondary | ICD-10-CM | POA: Insufficient documentation

## 2018-11-13 DIAGNOSIS — Y939 Activity, unspecified: Secondary | ICD-10-CM | POA: Insufficient documentation

## 2018-11-13 DIAGNOSIS — W293XXA Contact with powered garden and outdoor hand tools and machinery, initial encounter: Secondary | ICD-10-CM | POA: Insufficient documentation

## 2018-11-13 DIAGNOSIS — S81812A Laceration without foreign body, left lower leg, initial encounter: Secondary | ICD-10-CM | POA: Insufficient documentation

## 2018-11-13 NOTE — ED Triage Notes (Addendum)
Pt arrives POV for eval of L leg laceration sustained w/ chainsaw. Pt reports happened approx 1 hour PTA. Reports EMS assessed and dressed wound, no spurting noted. Wound is dressed and hemostatic in triage. Pt reports muscle is visible. L foot w/ +CSM. Denies numbness/tingling. Tetatnus UTD

## 2018-11-13 NOTE — ED Notes (Signed)
Attempted to call pt on the phone for room, no answer on any numbers that were provided at triage.

## 2018-11-13 NOTE — ED Notes (Signed)
Per KL pt to go to ED for further eval

## 2018-11-14 ENCOUNTER — Emergency Department (HOSPITAL_COMMUNITY)
Admission: EM | Admit: 2018-11-14 | Discharge: 2018-11-14 | Disposition: A | Discharge status: Home / Self Care | Payer: BC Managed Care – PPO | Attending: Emergency Medicine | Admitting: Emergency Medicine

## 2018-11-14 ENCOUNTER — Other Ambulatory Visit: Payer: Self-pay

## 2018-11-14 ENCOUNTER — Encounter (HOSPITAL_COMMUNITY): Payer: Self-pay | Admitting: *Deleted

## 2018-11-14 DIAGNOSIS — S81812A Laceration without foreign body, left lower leg, initial encounter: Secondary | ICD-10-CM | POA: Diagnosis not present

## 2018-11-14 DIAGNOSIS — Y939 Activity, unspecified: Secondary | ICD-10-CM | POA: Insufficient documentation

## 2018-11-14 DIAGNOSIS — Y929 Unspecified place or not applicable: Secondary | ICD-10-CM | POA: Diagnosis not present

## 2018-11-14 DIAGNOSIS — Y999 Unspecified external cause status: Secondary | ICD-10-CM | POA: Diagnosis not present

## 2018-11-14 DIAGNOSIS — Z79899 Other long term (current) drug therapy: Secondary | ICD-10-CM | POA: Diagnosis not present

## 2018-11-14 DIAGNOSIS — S8982XA Other specified injuries of left lower leg, initial encounter: Secondary | ICD-10-CM | POA: Diagnosis not present

## 2018-11-14 DIAGNOSIS — W293XXA Contact with powered garden and outdoor hand tools and machinery, initial encounter: Secondary | ICD-10-CM | POA: Diagnosis not present

## 2018-11-14 DIAGNOSIS — Z7901 Long term (current) use of anticoagulants: Secondary | ICD-10-CM | POA: Diagnosis not present

## 2018-11-14 DIAGNOSIS — F17210 Nicotine dependence, cigarettes, uncomplicated: Secondary | ICD-10-CM | POA: Diagnosis not present

## 2018-11-14 MED ORDER — CEPHALEXIN 500 MG PO CAPS: 500.0000 mg | ORAL_CAPSULE | Freq: Four times a day (QID) | ORAL | 0 refills | Status: DC

## 2018-11-14 MED ORDER — LIDOCAINE-EPINEPHRINE 1 %-1:100000 IJ SOLN
10.0000 mL | Freq: Once | INTRAMUSCULAR | Status: AC
  Administered 2018-11-14: 10 mL
  Filled 2018-11-14: qty 10

## 2018-11-14 MED ORDER — CEPHALEXIN 250 MG PO CAPS
500.0000 mg | ORAL_CAPSULE | Freq: Once | ORAL | Status: AC
  Administered 2018-11-14: 500 mg via ORAL
  Filled 2018-11-14: qty 2

## 2018-11-14 NOTE — ED Notes (Signed)
ED Provider at bedside. 

## 2018-11-14 NOTE — ED Triage Notes (Signed)
Pt arrives, reporting he cut his left lower leg with a chainsaw around 1430 yesterday. He was seen by ems, went to urgent care and came here, did not wait to be seen. Dressing from urgent care remains intact. No continued bleeding.

## 2018-11-14 NOTE — ED Provider Notes (Signed)
[facility] EMERGENCY DEPARTMENT Provider Note   CSN: [id number] Arrival date & time: 11/14/18  0224    History   Chief Complaint Chief Complaint  Patient presents with  . Laceration    HPI Kenneth [name] is a 43 y.o. male.     43 year old male with a history of narcotic abuse, PTSD presents to the emergency department for evaluation of laceration to his left lower extremity.  He was using a chainsaw when he accidentally cut his left leg.  Incident occurred at 1430.  He initially came to the emergency department, but left after waiting for over 5 hours because he needed to go home and take his daily medications.  States that his sister expressed concern for infection potential and encouraged him to come back to the ED tonight.  He is having pain at the laceration site which is constant, unchanged.  No numbness or paresthesias.  Tdap UTD.  The history is provided by the patient. No language interpreter was used.  Laceration    Past Medical History:  Diagnosis Date  . A-fib (HCC)   . Frequent headaches   . Narcotic abuse (HCC)   . PTSD (post-traumatic stress disorder)     Patient Active Problem List   Diagnosis Date Noted  . Atrial fibrillation with rapid ventricular response (HCC) 05/29/2018  . New onset a-fib (HCC) 05/28/2018  . Acute CHF (congestive heart failure) (HCC) 05/28/2018  . Methamphetamine abuse (HCC) 05/28/2018  . PTSD (post-traumatic stress disorder) 05/20/2015  . 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 Frequent headaches 05/20/2015    Past Surgical History:  Procedure Laterality Date  . CARDIOVERSION N/A 06/02/2018   Procedure: CARDIOVERSION;  Surgeon: Dolores Patty, MD;  Location: [facility] ENDOSCOPY;  Service: Cardiovascular;  Laterality: N/A;  . RIGHT/LEFT HEART CATH AND CORONARY ANGIOGRAPHY N/A 06/01/2018   Procedure: RIGHT/LEFT HEART CATH AND CORONARY ANGIOGRAPHY;  Surgeon: Dolores Patty, MD;  Location: [facility] INVASIVE  CV LAB;  Service: Cardiovascular;  Laterality: N/A;  . TEE WITHOUT CARDIOVERSION N/A 06/02/2018   Procedure: TRANSESOPHAGEAL ECHOCARDIOGRAM (TEE);  Surgeon: Dolores Patty, MD;  Location: [facility] ENDOSCOPY;  Service: Cardiovascular;  Laterality: N/A;  . TONSILLECTOMY          Home Medications    Prior to Admission medications   Medication Sig Start Date End Date Taking? Authorizing Provider  ALPRAZolam Prudy Feeler) 1 MG tablet Take 1 mg by mouth daily as needed for anxiety.    [provider]  amiodarone (PACERONE) 200 MG tablet Take 1 tablet (200 mg total) by mouth 2 (two) times daily. Patient taking differently: Take 200 mg by mouth daily.  07/06/18   Graciella Freer, PA-C  apixaban (ELIQUIS) 5 MG TABS tablet Take 1 tablet (5 mg total) by mouth 2 (two) times daily. 07/07/18   Bensimhon, Bevelyn Buckles, MD  carvedilol (COREG) 3.125 MG tablet Take 1 tablet (3.125 mg total) by mouth 2 (two) times daily with a meal. 07/06/18   Tillery, Mariam Dollar, PA-C  cephALEXin (KEFLEX) 500 MG capsule Take 1 capsule (500 mg total) by mouth 4 (four) times daily. 11/14/18   Antony Madura, PA-C  digoxin (LANOXIN) 0.125 MG tablet Take 1 tablet (0.125 mg total) by mouth daily. 07/07/18   Bensimhon, Bevelyn Buckles, MD  furosemide (LASIX) 40 MG tablet Take 1 tablet (40 mg total) by mouth as needed for fluid or edema. 07/06/18   Graciella Freer, PA-C  sacubitril-valsartan (ENTRESTO) 24-26 MG Take 1 tablet by mouth  2 (two) times daily. 08/04/18   Alford Highland, NP  spironolactone (ALDACTONE) 25 MG tablet Take 1 tablet (25 mg total) by mouth daily. 07/07/18   Bensimhon, Bevelyn Buckles, MD    Family History Family History  Problem Relation Age of Onset  . Miscarriages / [place] Brother   . Schizophrenia Brother     Social History Social History   Tobacco Use  . Smoking status: Current Some Day Smoker    Packs/day: 1.00    Years: 0.50    Pack years: 0.50    Types: Cigarettes  . Smokeless tobacco:  Current User    Types: Snuff  Substance Use Topics  . Alcohol use: No    Alcohol/week: 0.0 standard drinks  . Drug use: No    Types: Cocaine, Marijuana     Allergies   Patient has no known allergies.   Review of Systems Review of Systems Ten systems reviewed and are negative for acute change, except as noted in the HPI.    Physical Exam Updated Vital Signs BP 122/70 (BP Location: Right Arm)   Pulse 95   Temp 98.3 F (36.8 C) (Oral)   Resp 16   SpO2 99%   Physical Exam Vitals signs and nursing note reviewed.  Constitutional:      General: He is not in acute distress.    Appearance: He is well-developed. He is not diaphoretic.     Comments: Nontoxic appearing and in NAD  HENT:     Head: Normocephalic and atraumatic.  Eyes:     General: No scleral icterus.    Conjunctiva/sclera: Conjunctivae normal.  Neck:     Musculoskeletal: Normal range of motion.  Cardiovascular:     Rate and Rhythm: Normal rate and regular rhythm.     Pulses: Normal pulses.     Comments: DP pulse 2+ in the left lower extremity.  Capillary refill brisk in all digits of the left foot. Pulmonary:     Effort: Pulmonary effort is normal. No respiratory distress.     Comments: Respirations even and unlabored Musculoskeletal: Normal range of motion.       Legs:     Comments: TTP at laceration site. No purulent drainage or surrounding erythema/heat to touch. Negative Thompson's test. Full AROM and PROM of the L ankle including dorsiflexion and plantar flexion against resistance. Patient able to wiggle all toes.  Skin:    General: Skin is warm and dry.     Coloration: Skin is not pale.     Findings: No erythema or rash.  Neurological:     Mental Status: He is alert and oriented to person, place, and time.  Psychiatric:        Behavior: Behavior normal.        ED Treatments / Results  Labs (all labs ordered are listed, but only abnormal results are displayed) Labs Reviewed - No data to  display  EKG None  Radiology Dg Tibia/fibula Left  Result Date: 11/13/2018 CLINICAL DATA:  Left leg laceration. EXAM: LEFT TIBIA AND FIBULA - 2 VIEW COMPARISON:  None. FINDINGS: There is no evidence of fracture or other focal bone lesions. Soft tissues are unremarkable. Ankle monitor is noted. IMPRESSION: Negative. Electronically Signed   By: Lupita Raider M.D.   On: 11/13/2018 16:11    Procedures Wound packing Date/Time: 11/14/2018 6:03 AM Performed by: Antony Madura, PA-C Authorized by: Antony Madura, PA-C  Consent: The procedure was performed in an emergent situation. Verbal consent obtained. Risks and  benefits: risks, benefits and alternatives were discussed Consent given by: patient Patient understanding: patient states understanding of the procedure being performed Patient consent: the patient's understanding of the procedure matches consent given Procedure consent: procedure consent matches procedure scheduled Relevant documents: relevant documents present and verified Test results: test results available and properly labeled Site marked: the operative site was marked Imaging studies: imaging studies available Required items: required blood products, implants, devices, and special equipment available Patient identity confirmed: verbally with patient and arm band Time out: Immediately prior to procedure a "time out" was called to verify the correct patient, procedure, equipment, support staff and site/side marked as required. Local anesthesia used: yes Anesthesia: local infiltration  Anesthesia: Local anesthesia used: yes Local Anesthetic: lidocaine 2% with epinephrine  Sedation: Patient sedated: no  Patient tolerance: Patient tolerated the procedure well with no immediate complications Comments: Packing of wet-to-dry dressing in gaping laceration sustained by chainsaw >15 hours ago. Lidocaine applied for pain control during irrigation with 1L sterile water.     (including critical care time)  Medications Ordered in ED Medications  cephALEXin (KEFLEX) capsule 500 mg (has no administration in time range)  lidocaine-EPINEPHrine (XYLOCAINE W/EPI) 1 %-1:100000 (with pres) injection 10 mL (10 mLs Other Given by Other 11/14/18 0612)    6:41 AM Wet-to-dry dressing applied and secured with [place].   Initial Impression / Assessment and Plan / ED Course  I have reviewed the triage vital signs and the nursing notes.  Pertinent labs & imaging results that were available during my care of the patient were reviewed by me and considered in my medical decision making (see chart for details).        43 year old male presents to the emergency department for laceration to his left lower leg sustained at 1430 yesterday after cutting himself with a chainsaw.  Patient neurovascularly intact with updated tetanus.  Discussed inability to close wound as laceration sustained >15 hours ago. Wound was copiously irrigated with 1 L of sterile water.  A wet-to-dry dressing was applied.  Counseled on wound care.  Will also have patient continue a 5-day course of Keflex for protection against skin flora.  Encouraged wound recheck in 48 hours with return to the ED for worsening symptoms.  Return precautions discussed and provided. Patient discharged in stable condition with no unaddressed concerns.   Final Clinical Impressions(s) / ED Diagnoses   Final diagnoses:  Laceration of left lower leg, initial encounter    ED Discharge Orders         Ordered    cephALEXin (KEFLEX) 500 MG capsule  4 times daily     11/14/18 0640           Antony Madura, PA-C 11/14/18 1975    Nira Conn, MD 11/14/18 2330

## 2018-11-14 NOTE — Discharge Instructions (Addendum)
Change your wet-to-dry dressing 3 times per day for the next 5 to 7 days.  Do not apply peroxide to the skin as this will breakdown newly forming skin and may prolong wound healing.  Take Tylenol or ibuprofen for management of discomfort.  You have been prescribed Keflex for infection prevention.  Take as prescribed for 5 days.  Have your wound rechecked in 48 hours by a primary care doctor; come back to the ED if you are unable to follow up with a primary physician. Return sooner if symptoms persist or worsen.

## 2018-11-14 NOTE — ED Notes (Signed)
Pt unable to tolerate irrgation of wound due to pain- PA made aware with irrigate after lidocaine administration

## 2018-11-24 ENCOUNTER — Telehealth (HOSPITAL_COMMUNITY): Payer: Self-pay

## 2018-11-24 ENCOUNTER — Ambulatory Visit (HOSPITAL_COMMUNITY)
Admission: RE | Admit: 2018-11-24 | Discharge: 2018-11-24 | Disposition: A | Discharge status: Home / Self Care | Payer: BC Managed Care – PPO | Source: Ambulatory Visit | Attending: Student | Admitting: Student

## 2018-11-24 ENCOUNTER — Other Ambulatory Visit: Payer: Self-pay

## 2018-11-24 ENCOUNTER — Ambulatory Visit (HOSPITAL_BASED_OUTPATIENT_CLINIC_OR_DEPARTMENT_OTHER)
Admission: RE | Admit: 2018-11-24 | Discharge: 2018-11-24 | Disposition: A | Discharge status: Home / Self Care | Payer: BC Managed Care – PPO | Source: Ambulatory Visit | Attending: Internal Medicine | Admitting: Internal Medicine

## 2018-11-24 DIAGNOSIS — I110 Hypertensive heart disease with heart failure: Secondary | ICD-10-CM | POA: Insufficient documentation

## 2018-11-24 DIAGNOSIS — F17210 Nicotine dependence, cigarettes, uncomplicated: Secondary | ICD-10-CM | POA: Diagnosis not present

## 2018-11-24 DIAGNOSIS — I480 Paroxysmal atrial fibrillation: Secondary | ICD-10-CM

## 2018-11-24 DIAGNOSIS — I5022 Chronic systolic (congestive) heart failure: Secondary | ICD-10-CM

## 2018-11-24 DIAGNOSIS — I428 Other cardiomyopathies: Secondary | ICD-10-CM | POA: Insufficient documentation

## 2018-11-24 DIAGNOSIS — Z79899 Other long term (current) drug therapy: Secondary | ICD-10-CM | POA: Diagnosis not present

## 2018-11-24 DIAGNOSIS — I351 Nonrheumatic aortic (valve) insufficiency: Secondary | ICD-10-CM | POA: Diagnosis not present

## 2018-11-24 DIAGNOSIS — F4310 Post-traumatic stress disorder, unspecified: Secondary | ICD-10-CM | POA: Insufficient documentation

## 2018-11-24 DIAGNOSIS — Z7901 Long term (current) use of anticoagulants: Secondary | ICD-10-CM | POA: Insufficient documentation

## 2018-11-24 DIAGNOSIS — I509 Heart failure, unspecified: Secondary | ICD-10-CM

## 2018-11-24 MED ORDER — AMIODARONE HCL 100 MG PO TABS: 100.0000 mg | ORAL_TABLET | Freq: Every day | ORAL | 1 refills | Status: DC

## 2018-11-24 NOTE — Addendum Note (Signed)
Encounter addended by: Jovita Kussmaul, RN on: 11/24/2018 1:29 PM  Actions taken: Order list changed, Medication long-term status modified, Clinical Note Signed

## 2018-11-24 NOTE — Addendum Note (Signed)
Encounter addended by: Jovita Kussmaul, RN on: 11/24/2018 11:33 AM  Actions taken: Medication long-term status modified, Pharmacy for encounter modified, Order list changed

## 2018-11-24 NOTE — Telephone Encounter (Signed)
Reviewed AVS with wife:  1. Stop digoxin /confirmed 2. Cut amio to 100 daily /confirmed 3. F/u 4-6 months.  Aron Baba

## 2018-11-24 NOTE — Progress Notes (Signed)
  Echocardiogram 2D Echocardiogram has been performed.  Kenneth [name] 11/24/2018, 10:47 AM

## 2018-11-24 NOTE — Progress Notes (Signed)
Pt wife called back after AVS review and stated pt has not been taking amiodarone for months, per DB ok for med to be d/c'd. Updated and confirmed med list with wife.

## 2018-11-24 NOTE — Progress Notes (Signed)
Heart Failure TeleHealth Note  Due to national recommendations of social distancing due to COVID 19, Audio/video telehealth visit is felt to be most appropriate for this patient at this time.  See MyChart message from today for patient consent regarding telehealth for [name].  Date:  11/24/2018   ID:  Kenneth [name], DOB 30-Sep-1975, MRN [id number]  Location: Home  Provider location:  Advanced [facility] Failure Clinic Type of Visit: Established patient  PCP:  Patient, No Pcp Per  Cardiologist:  Arvilla Meresaniel Hellen Shanley, MD Primary HF: Arley Garant  Chief Complaint: Heart Failure follow-up   History of Present Illness:  Kenneth [name] is a 43 y.o. male w/ h/o chronic systolic HF, paroxysmal Afib, polysubstance abuse, and chronic suboxone use.  Admitted 12/15 - 06/04/18 with new onset systolic CHF. Cath with NICM, EF 20% (normal cors) thought to be most likely due to combination of Afib with RVR and polysubstance abuse.  Medications adjusted as tolerated. He underwent TEE/DCCV 06/02/18 and maintained NSR to discharge.   He presents today for audio/video conferencing for a telehealth visit today. At last visit Entresto decreased to 24/26 due to symptomatic hypotension. Says he has been doing well. Now back to work cutting trees. Last week cut himself in the ankle with a chainsaw. Only occasioanl edema. Will take lasix 20mg  as needed. Can do most activities as needed.    1 month follow up. Last visit coreg was added and Entresto was increased. He was seen in ED 07/20/18 with a migraine. Overall doing okay. He has been dizzy with standing since last medication changes. He gets tunnel vision and feels like he is going to pass out. No SOB. Can walk around in stores with no breaks. Fatigue much improved. He feels back to normal. No edema, orthopnea, or PND. No cough. Has chest pain occasionally on left side. Seems to occur after eating spicy foods. Not exertional. Occasional nausea with it.  Anxiety is really bad. He is taking xanax, but has abstained from any other street drugs. Appetite down the last 2 days, but picking back up. No bleeding on eliquis. Taking all medications. Weight down 5 lbs on our scale. Weights 177-180 lbs on his home scale. Took lasix x1 with weight gain, which was the day he went to ED.  Kenneth [name] denies symptoms worrisome for COVID 19.   Past Medical History:  Diagnosis Date  . A-fib (HCC)   . Frequent headaches   . Narcotic abuse (HCC)   . PTSD (post-traumatic stress disorder)    Past Surgical History:  Procedure Laterality Date  . CARDIOVERSION N/A 06/02/2018   Procedure: CARDIOVERSION;  Surgeon: Dolores PattyBensimhon, Ellanie Oppedisano R, MD;  [facility];  Service: Cardiovascular;  Laterality: N/A;  . RIGHT/LEFT HEART CATH AND CORONARY ANGIOGRAPHY N/A 06/01/2018   Procedure: RIGHT/LEFT HEART CATH AND CORONARY ANGIOGRAPHY;  Surgeon: Dolores PattyBensimhon, Bernardino Dowell R, MD;  [facility] INVASIVE CV LAB;  Service: Cardiovascular;  Laterality: N/A;  . TEE WITHOUT CARDIOVERSION N/A 06/02/2018   Procedure: TRANSESOPHAGEAL ECHOCARDIOGRAM (TEE);  Surgeon: Dolores PattyBensimhon, Jabarri Stefanelli R, MD;  [facility];  Service: Cardiovascular;  Laterality: N/A;  . TONSILLECTOMY       Current Outpatient Medications  Medication Sig Dispense Refill  . ALPRAZolam (XANAX) 1 MG tablet Take 1 mg by mouth daily as needed for anxiety.    Marland Kitchen. amiodarone (PACERONE) 200 MG tablet Take 1 tablet (200 mg total) by mouth 2 (two) times daily. (Patient taking differently: Take 200 mg by mouth daily. )  60 tablet 6  . apixaban (ELIQUIS) 5 MG TABS tablet Take 1 tablet (5 mg total) by mouth 2 (two) times daily. 60 tablet 6  . carvedilol (COREG) 3.125 MG tablet Take 1 tablet (3.125 mg total) by mouth 2 (two) times daily with a meal. 60 tablet 6  . cephALEXin (KEFLEX) 500 MG capsule Take 1 capsule (500 mg total) by mouth 4 (four) times daily. 20 capsule 0  . digoxin (LANOXIN) 0.125 MG tablet Take 1 tablet  (0.125 mg total) by mouth daily. 30 tablet 6  . furosemide (LASIX) 40 MG tablet Take 1 tablet (40 mg total) by mouth as needed for fluid or edema. 30 tablet 6  . sacubitril-valsartan (ENTRESTO) 24-26 MG Take 1 tablet by mouth 2 (two) times daily. 60 tablet 6  . spironolactone (ALDACTONE) 25 MG tablet Take 1 tablet (25 mg total) by mouth daily. 30 tablet 6   No current facility-administered medications for this encounter.     Allergies:   Patient has no known allergies.   Social History:  The patient  reports that he has been smoking cigarettes. He has a 0.50 pack-year smoking history. His smokeless tobacco use includes snuff. He reports that he does not drink alcohol or use drugs.   Family History:  The patient's family history includes Miscarriages / Stillbirths in his brother; Schizophrenia in his brother.   ROS:  Please see the history of present illness.   All other systems are personally reviewed and negative.   Exam:  (Video/Tele Health Call; Exam is subjective and or/visual.) General:  Speaks in full sentences. No resp difficulty. Lungs: Normal respiratory effort with conversation.  Abdomen: Non-distended per patient report Extremities: Pt denies edema. Neuro: Alert & oriented x 3.   Recent Labs: 05/29/2018: B Natriuretic Peptide 696.2 06/03/2018: Magnesium 2.1 07/20/2018: Hemoglobin 14.0; Platelets 277 08/04/2018: ALT 20; BUN 10; Creatinine, Ser 1.03; Potassium 4.2; Sodium 139; TSH 3.528  Personally reviewed   Wt Readings from Last 3 Encounters:  11/13/18 81.6 kg (180 lb)  08/04/18 82.5 kg (181 lb 12.8 oz)  07/06/18 84.9 kg (187 lb 3.2 oz)      ASSESSMENT AND PLAN:  1. Chronic systolic CHF, NICM - ECHO 05/29/18 LVEF 20%.Likely in setting of chronic substance abuse and Afib RVR.  - NICM by cath 06/01/18. Compensated hemodynamics on low dose milrinone. - Milrinone stopped 12/21 - NYHA I-II symptoms - Echo today reviewed personally EF 55-60%.  - Volume status dry on  exam. - Continue lasix 20 as needed only. - Continue Entresto 24/26 mg BID unable to tolerate higher doses due to severe dizziness - Stop digoxin - Continue spiro 25 mg daily. - Continue coreg 3.125 mg BID   2. Paroxysmal Afib  - Regular on echo - Cut amiodarone to 100 mg daily. Will see back in 4 months and will either stop completely or switch to another agent -CHA2DS2-VAScis at least 2 (CHF, HTN). - Continue Eliquis 5mg  BID. No bleeding.   3. Substance abuse - UDS 05/28/18 positive for opiates, BZDs, amphetamines, and THC. - He is on Methadone  COVID screen The patient does not have any symptoms that suggest any further testing/ screening at this time.  Social distancing reinforced today.  Recommended follow-up:  As above  Relevant cardiac medications were reviewed at length with the patient today.   The patient does not have concerns regarding their medications at this time.   The following changes were made today:  As above  Today, I have spent  22 minutes with the patient with telehealth technology discussing the above issues .    Signed, Arvilla Meres, MD  11/24/2018 11:08 AM  Advanced Heart Failure Clinic [facility] [address] Heart and Vascular [place] [address] [phone] (office) [phone] (fax)

## 2018-12-02 ENCOUNTER — Other Ambulatory Visit: Payer: Self-pay

## 2018-12-02 ENCOUNTER — Ambulatory Visit (HOSPITAL_COMMUNITY)
Admission: EM | Admit: 2018-12-02 | Discharge: 2018-12-02 | Disposition: A | Discharge status: Home / Self Care | Payer: BC Managed Care – PPO | Attending: Internal Medicine | Admitting: Internal Medicine

## 2018-12-02 ENCOUNTER — Encounter (HOSPITAL_COMMUNITY): Payer: Self-pay

## 2018-12-02 DIAGNOSIS — L03115 Cellulitis of right lower limb: Secondary | ICD-10-CM | POA: Diagnosis not present

## 2018-12-02 MED ORDER — DOXYCYCLINE HYCLATE 100 MG PO CAPS: 100.0000 mg | ORAL_CAPSULE | Freq: Two times a day (BID) | ORAL | 0 refills | Status: AC

## 2018-12-02 NOTE — ED Triage Notes (Addendum)
Pt states he had a white pimple on his right knee. Pt states it was a black center. 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 He says his knee is hot and it hurts a lot. It's red and swelling.

## 2018-12-02 NOTE — ED Provider Notes (Signed)
Kenneth [name]    CSN: [id number] Arrival date & time: 12/02/18  1708     History   Chief Complaint Chief Complaint  Patient presents with  . Knee Pain  . Insect Bite    HPI Kenneth [name] is a 43 y.o. male with history of A. fib, narcotic abuse, MRSA infection presenting for right knee pain and swelling.  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  Denies bug/spider/tick bite.  Patient endorsing redness, pain, intermittent purulent discharge, throbbing sensation when he stands.  Patient denies fever, chills, malaise, decreased appetite, joint swelling, difficulty ambulating, chest pain, shortness of breath.   No lower extremity paresthesias or numbness.  Of note, patient was recently seen in the ER on 6/2 for chainsaw laceration, completed course of Keflex 2 weeks ago and has been healing well.    Past Medical History:  Diagnosis Date  . A-fib ([place])   . Frequent headaches   . Narcotic abuse ([place])   . PTSD (post-traumatic stress disorder)     Patient Active Problem List   Diagnosis Date Noted  . Atrial fibrillation with rapid ventricular response ([place]) 05/29/2018  . New onset a-fib ([place]) 05/28/2018  . Acute CHF (congestive heart failure) ([place]) 05/28/2018  . 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 PTSD (post-traumatic stress disorder) 05/20/2015  . Narcotic abuse in remission ([place]) 05/20/2015  . Frequent headaches 05/20/2015    Past Surgical History:  Procedure Laterality Date  . CARDIOVERSION N/A 06/02/2018   Procedure: CARDIOVERSION;  Surgeon: Jolaine Artist, MD;  Location: [facility] ENDOSCOPY;  Service: Cardiovascular;  Laterality: N/A;  . RIGHT/LEFT HEART CATH AND CORONARY ANGIOGRAPHY N/A 06/01/2018   Procedure: RIGHT/LEFT HEART CATH AND CORONARY ANGIOGRAPHY;  Surgeon: Jolaine Artist, MD;  Location: [facility] CV LAB;  Service: Cardiovascular;  Laterality: N/A;  . TEE WITHOUT CARDIOVERSION N/A 06/02/2018   Procedure: TRANSESOPHAGEAL ECHOCARDIOGRAM (TEE);  Surgeon: Jolaine Artist, MD;  Location:  [facility] ENDOSCOPY;  Service: Cardiovascular;  Laterality: N/A;  . TONSILLECTOMY         Home Medications    Prior to Admission medications   Medication Sig Start Date End Date Taking? Authorizing Provider  ALPRAZolam Duanne Moron) 1 MG tablet Take 1 mg by mouth daily as needed for anxiety.    [provider]  apixaban (ELIQUIS) 5 MG TABS tablet Take 1 tablet (5 mg total) by mouth 2 (two) times daily. 07/07/18   Bensimhon, Shaune Pascal, MD  carvedilol (COREG) 3.125 MG tablet Take 1 tablet (3.125 mg total) by mouth 2 (two) times daily with a meal. 07/06/18   Tillery, Satira Mccallum, PA-C  doxycycline (VIBRAMYCIN) 100 MG capsule Take 1 capsule (100 mg total) by mouth 2 (two) times daily for 7 days. 12/02/18 12/09/18  Hall-Potvin, [place], PA-C  furosemide (LASIX) 40 MG tablet Take 1 tablet (40 mg total) by mouth as needed for fluid or edema. 07/06/18   Shirley Friar, PA-C  sacubitril-valsartan (ENTRESTO) 24-26 MG Take 1 tablet by mouth 2 (two) times daily. 08/04/18   Georgiana Shore, NP  spironolactone (ALDACTONE) 25 MG tablet Take 1 tablet (25 mg total) by mouth daily. 07/07/18   Bensimhon, Shaune Pascal, MD    Family History Family History  Problem Relation Age of Onset  . Miscarriages / [place] Brother   . Schizophrenia Brother     Social History Social History   Tobacco Use  . Smoking status: Current Some Day Smoker    Packs/day: 1.00  Years: 0.50    Pack years: 0.50    Types: Cigarettes  . Smokeless tobacco: Current User    Types: Snuff  Substance Use Topics  . Alcohol use: No    Alcohol/week: 0.0 standard drinks  . Drug use: No    Types: Cocaine, Marijuana     Allergies   Patient has no known allergies.   Review of Systems As per HPI   Physical Exam Triage Vital Signs ED Triage Vitals  Enc Vitals Group     BP 12/02/18 1740 108/67     Pulse Rate 12/02/18 1740 96      Resp 12/02/18 1740 18     Temp 12/02/18 1740 98.3 F (36.8 C)     Temp src --      SpO2 12/02/18 1740 98 %     Weight 12/02/18 1737 180 lb (81.6 kg)     Height --      Head Circumference --      Peak Flow --      Pain Score 12/02/18 1737 10     Pain Loc --      Pain Edu? --      Excl. in GC? --    No data found.  Updated Vital Signs BP 108/67 (BP Location: Right Arm)   Pulse 96   Temp 98.3 F (36.8 C)   Resp 18   Wt 180 lb (81.6 kg)   SpO2 98%   BMI 23.11 kg/m   Visual Acuity Right Eye Distance:   Left Eye Distance:   Bilateral Distance:    Right Eye Near:   Left Eye Near:    Bilateral Near:     Physical Exam Vitals signs reviewed.  Constitutional:      General: He is not in acute distress. HENT:     Head: Normocephalic and atraumatic.  Eyes:     General: No scleral icterus.    Pupils: Pupils are equal, round, and reactive to light.  Neck:     Musculoskeletal: Neck supple. No muscular tenderness.  Cardiovascular:     Rate and Rhythm: Normal rate.  Pulmonary:     Effort: Pulmonary effort is normal.  Musculoskeletal:     Comments: No obvious bony deformity or joint swelling.  Prepatellar, patella, medial and lateral aspects of knee joint nontender.  Negative blotting.  Patient is able to bear weight, and ambulate normally.  NVI bilaterally/symmetric  Lymphadenopathy:     Cervical: No cervical adenopathy.  Skin:    Coloration: Skin is not jaundiced or pale.     Comments: 3 x 3 cm area of erythema that is exquisitely tender to palpation.  Surrounding erythema extending to a 15 x 15 cm patch.  Neurological:     Mental Status: He is alert and oriented to person, place, and time.      UC Treatments / Results  Labs (all labs ordered are listed, but only abnormal results are displayed) Labs Reviewed - No data to display  EKG None  Radiology No results found.  Procedures Procedures (including critical care time)  Medications Ordered in UC  Medications - No data to display  Initial Impression / Assessment and Plan / UC Course  I have reviewed the triage vital signs and the nursing notes.  Pertinent labs & imaging results that were available during my care of the patient were reviewed by me and considered in my medical decision making (see chart for details).     43 year old male with history of  narcotic use, MRSA infection, atrial fibrillation presenting for right knee pain and redness.  Physical findings consistent with cellulitis, no acute findings concerning for septic arthritis.  Will treat outpatient with doxycycline x7 days patient overall appears well and is afebrile.  Strict return precautions discussed, patient verbalized understanding. Final Clinical Impressions(s) / UC Diagnoses   Final diagnoses:  Cellulitis of right lower extremity     Discharge Instructions     Take antibiotic twice daily with food as prescribed. Finish antibiotics all the way. Return if you develop fever, worsening pain, swelling of knee, inability to walk.    ED Prescriptions    Medication Sig Dispense Auth. Provider   doxycycline (VIBRAMYCIN) 100 MG capsule Take 1 capsule (100 mg total) by mouth 2 (two) times daily for 7 days. 14 capsule Hall-Potvin, [place], PA-C     Controlled Substance Prescriptions [place] Controlled Substance Registry consulted? Not Applicable   Shea Evans, [place] 12/02/18 1823

## 2018-12-02 NOTE — Discharge Instructions (Signed)
Take antibiotic twice daily with food as prescribed. Finish antibiotics all the way. Return if you develop fever, worsening pain, swelling of knee, inability to walk.

## 2019-01-22 ENCOUNTER — Other Ambulatory Visit (HOSPITAL_COMMUNITY): Payer: Self-pay

## 2019-01-22 MED ORDER — CARVEDILOL 3.125 MG PO TABS: 3.1250 mg | ORAL_TABLET | Freq: Two times a day (BID) | ORAL | 5 refills | Status: DC

## 2019-03-07 NOTE — Progress Notes (Signed)
Advanced Heart Failure Clinic Note   Date:  03/07/2019   ID:  [name], DOB [birth year], MRN [id number]  Location: Home  Provider location: [place] [facility] Type of Visit: Established patient  PCP:  Patient, No Pcp Per  Cardiologist:  Arvilla Meres, MD Primary HF: Bensimhon  Chief Complaint: Heart Failure follow-up   History of Present Illness:  Kenneth [name] is a 43 y.o. male w/ h/o chronic systolic HF, paroxysmal Afib, polysubstance abuse, and chronic suboxone use.  Admitted 12/15 - 06/04/18 with new onset systolic CHF. Cath with NICM, EF 20% (normal cors) thought to be most likely due to combination of Afib with RVR and polysubstance abuse.  Medications adjusted as tolerated. He underwent TEE/DCCV 06/02/18 and maintained NSR to discharge.   Echo 6/20: EF 55-60% RV normal   He presents today for f/u. Feels good. Still cutting trees. No CP or SOB. No edema. Taking all meds. No dizziness.    Past Medical History:  Diagnosis Date  . A-fib (HCC)   . Frequent headaches   . Narcotic abuse (HCC)   . PTSD (post-traumatic stress disorder)    Past Surgical History:  Procedure Laterality Date  . CARDIOVERSION N/A 06/02/2018   Procedure: CARDIOVERSION;  Surgeon: Dolores Patty, MD;  Location: [facility] ENDOSCOPY;  Service: Cardiovascular;  Laterality: N/A;  . RIGHT/LEFT HEART CATH AND CORONARY ANGIOGRAPHY N/A 06/01/2018   Procedure: RIGHT/LEFT HEART CATH AND CORONARY ANGIOGRAPHY;  Surgeon: Dolores Patty, MD;  Location: [facility] INVASIVE CV LAB;  Service: Cardiovascular;  Laterality: N/A;  . TEE WITHOUT CARDIOVERSION N/A 06/02/2018   Procedure: TRANSESOPHAGEAL ECHOCARDIOGRAM (TEE);  Surgeon: Dolores Patty, MD;  Location: [facility] ENDOSCOPY;  Service: Cardiovascular;  Laterality: N/A;  . TONSILLECTOMY       Current Outpatient Medications  Medication Sig Dispense Refill  . ALPRAZolam (XANAX) 1 MG tablet Take 1 mg by mouth daily as needed for  anxiety.    Marland Kitchen apixaban (ELIQUIS) 5 MG TABS tablet Take 1 tablet (5 mg total) by mouth 2 (two) times daily. 60 tablet 6  . carvedilol (COREG) 3.125 MG tablet Take 1 tablet (3.125 mg total) by mouth 2 (two) times daily with a meal. 60 tablet 5  . furosemide (LASIX) 40 MG tablet Take 1 tablet (40 mg total) by mouth as needed for fluid or edema. 30 tablet 6  . sacubitril-valsartan (ENTRESTO) 24-26 MG Take 1 tablet by mouth 2 (two) times daily. 60 tablet 6  . spironolactone (ALDACTONE) 25 MG tablet Take 1 tablet (25 mg total) by mouth daily. 30 tablet 6   No current facility-administered medications for this encounter.     Allergies:   Patient has no known allergies.   Social History:  The patient  reports that he has been smoking cigarettes. He has a 0.50 pack-year smoking history. His smokeless tobacco use includes snuff. He reports that he does not drink alcohol or use drugs.   Family History:  The patient's family history includes Miscarriages / Stillbirths in his brother; Schizophrenia in his brother.   ROS:  Please see the history of present illness.   All other systems are personally reviewed and negative.   Vitals:   03/08/19 1012  BP: 128/88  Pulse: 90  SpO2: 97%  Weight: 85 kg (187 lb 6.4 oz)    Exam:  General:  Well appearing. No resp difficulty HEENT: normal Neck: supple. no JVD. Carotids 2+ bilat; no bruits. No lymphadenopathy or thryomegaly appreciated. Cor: PMI  nondisplaced. Regular rate & rhythm. No rubs, gallops or murmurs. Lungs: clear Abdomen: soft, nontender, nondistended. No hepatosplenomegaly. No bruits or masses. Good bowel sounds. Extremities: no cyanosis, clubbing, rash, edema  Scar on L ankle from chainsaw wound. R ankle alarm  Neuro: alert & orientedx3, cranial nerves grossly intact. moves all 4 extremities w/o difficulty. Affect pleasant   Recent Labs: 05/29/2018: B Natriuretic Peptide 696.2 06/03/2018: Magnesium 2.1 07/20/2018: Hemoglobin 14.0; Platelets  277 08/04/2018: ALT 20; BUN 10; Creatinine, Ser 1.03; Potassium 4.2; Sodium 139; TSH 3.528  Personally reviewed   Wt Readings from Last 3 Encounters:  12/02/18 81.6 kg (180 lb)  11/13/18 81.6 kg (180 lb)  08/04/18 82.5 kg (181 lb 12.8 oz)      ASSESSMENT AND PLAN:  1. Chronic systolic CHF, NICM - ECHO 05/29/18 LVEF 20%.Likely in setting of chronic substance abuse and Afib RVR.  - NICM by cath 06/01/18. Compensated hemodynamics on low dose milrinone. - Milrinone stopped 12/21 - NYHA I - Echo 6/20 EF 55-60%.  - Volume status dry on exam.ok 0 as needed only. - Continue Entresto 24/26 mg BID unable to tolerate higher doses due to severe dizziness - Continue spiro 25 mg daily. - Continue coreg 3.125 mg BID   2. Paroxysmal Afib  - He is in NR - Now off amio -CHA2DS2-VASc2 (CHF, HTN). - Given that he has not had further AF and HF resolved and he works with chainsaws will stop Eliquis - I have asked him to monitor resting HRs with FitBit or apple watch if goes up > 100 or irregular -> call me immedaitely  3. Substance abuse - UDS 05/28/18 positive for opiates, BZDs, amphetamines, and THC. - He is on Methadone   Signed, Glori Bickers, MD  03/07/2019 10:17 PM  Advanced Heart Failure [place] [address] Heart and [place] [address] [phone] (office) [phone] (fax)

## 2019-03-08 ENCOUNTER — Encounter (HOSPITAL_COMMUNITY): Payer: Self-pay | Admitting: Internal Medicine

## 2019-03-08 ENCOUNTER — Other Ambulatory Visit: Payer: Self-pay

## 2019-03-08 ENCOUNTER — Ambulatory Visit (HOSPITAL_COMMUNITY)
Admission: RE | Admit: 2019-03-08 | Discharge: 2019-03-08 | Disposition: A | Discharge status: Home / Self Care | Payer: BC Managed Care – PPO | Source: Ambulatory Visit | Attending: Internal Medicine | Admitting: Internal Medicine

## 2019-03-08 VITALS — BP 128/88 | HR 90 | Wt 187.4 lb

## 2019-03-08 DIAGNOSIS — F1910 Other psychoactive substance abuse, uncomplicated: Secondary | ICD-10-CM | POA: Diagnosis not present

## 2019-03-08 DIAGNOSIS — I480 Paroxysmal atrial fibrillation: Secondary | ICD-10-CM | POA: Diagnosis not present

## 2019-03-08 DIAGNOSIS — F4310 Post-traumatic stress disorder, unspecified: Secondary | ICD-10-CM | POA: Insufficient documentation

## 2019-03-08 DIAGNOSIS — I5022 Chronic systolic (congestive) heart failure: Secondary | ICD-10-CM

## 2019-03-08 DIAGNOSIS — Z79899 Other long term (current) drug therapy: Secondary | ICD-10-CM | POA: Insufficient documentation

## 2019-03-08 DIAGNOSIS — F17210 Nicotine dependence, cigarettes, uncomplicated: Secondary | ICD-10-CM | POA: Diagnosis not present

## 2019-03-08 DIAGNOSIS — I428 Other cardiomyopathies: Secondary | ICD-10-CM | POA: Diagnosis not present

## 2019-03-08 DIAGNOSIS — Z7901 Long term (current) use of anticoagulants: Secondary | ICD-10-CM | POA: Insufficient documentation

## 2019-03-08 DIAGNOSIS — F1110 Opioid abuse, uncomplicated: Secondary | ICD-10-CM | POA: Insufficient documentation

## 2019-03-08 LAB — BASIC METABOLIC PANEL
Anion gap: 9 (ref 5–15)
BUN: 15 mg/dL (ref 6–20)
CO2: 27 mmol/L (ref 22–32)
Calcium: 9.4 mg/dL (ref 8.9–10.3)
Chloride: 101 mmol/L (ref 98–111)
Creatinine, Ser: 1 mg/dL (ref 0.61–1.24)
GFR calc Af Amer: >60 mL/min (ref >60)
GFR calc non Af Amer: >60 mL/min (ref >60)
Glucose, Bld: 100 mg/dL — ABNORMAL HIGH (ref 70–99)
Potassium: 4.2 mmol/L (ref 3.5–5.1)
Sodium: 137 mmol/L (ref 135–145)

## 2019-03-08 LAB — CBC
HCT: 39.5 % (ref 39.0–52.0)
Hemoglobin: 13.1 g/dL (ref 13.0–17.0)
MCH: 29.6 pg (ref 26.0–34.0)
MCHC: 33.2 g/dL (ref 30.0–36.0)
MCV: 89.2 fL (ref 80.0–100.0)
Platelets: 325 10*3/uL (ref 150–400)
RBC: 4.43 MIL/uL (ref 4.22–5.81)
RDW: 13.4 % (ref 11.5–15.5)
WBC: 6 10*3/uL (ref 4.0–10.5)
nRBC: 0 % (ref 0.0–0.2)

## 2019-03-08 NOTE — Addendum Note (Signed)
Encounter addended by: Shonna Chock, CMA on: 1/96/2229 10:32 AM  Actions taken: Diagnosis association updated, Order list changed, Medication long-term status modified, Charge Capture section accepted, Clinical Note Signed

## 2019-03-08 NOTE — Patient Instructions (Addendum)
Labs done today. Patient will contact you only if your labs are abnormal.  STOP Eliquis  Please continue all other medication as prescribed.   Your physician recommends that you schedule a follow-up appointment in: 9 months. We will contact you to schedule an appointment.  At the [facility], you and your health needs are our priority. As part of our continuing mission to provide you with exceptional heart care, we have created designated Provider Care Teams. These Care Teams include your primary Cardiologist (physician) and Advanced Practice Providers (APPs- Physician Assistants and Nurse Practitioners) who all work together to provide you with the care you need, when you need it.   You may see any of the following providers on your designated Care Team at your next follow up: Marland Kitchen Dr Glori Bickers . Dr Loralie Champagne . Darrick Grinder, NP   Please be sure to bring in all your medications bottles to every appointment.

## 2019-04-30 ENCOUNTER — Other Ambulatory Visit (HOSPITAL_COMMUNITY): Payer: Self-pay | Admitting: Internal Medicine

## 2019-06-04 ENCOUNTER — Other Ambulatory Visit (HOSPITAL_COMMUNITY): Payer: Self-pay

## 2019-06-04 MED ORDER — SACUBITRIL-VALSARTAN 24-26 MG PO TABS: 1.0000 | ORAL_TABLET | Freq: Two times a day (BID) | ORAL | 6 refills | Status: DC

## 2019-06-18 ENCOUNTER — Encounter (HOSPITAL_COMMUNITY): Payer: Self-pay | Admitting: *Deleted

## 2019-06-18 ENCOUNTER — Telehealth (HOSPITAL_COMMUNITY): Payer: Self-pay | Admitting: *Deleted

## 2019-06-18 ENCOUNTER — Emergency Department (HOSPITAL_COMMUNITY)
Admission: EM | Admit: 2019-06-18 | Discharge: 2019-06-19 | Disposition: A | Discharge status: Home / Self Care | Payer: BC Managed Care – PPO | Attending: Emergency Medicine | Admitting: Emergency Medicine

## 2019-06-18 ENCOUNTER — Other Ambulatory Visit: Payer: Self-pay

## 2019-06-18 DIAGNOSIS — F17210 Nicotine dependence, cigarettes, uncomplicated: Secondary | ICD-10-CM | POA: Diagnosis not present

## 2019-06-18 DIAGNOSIS — R072 Precordial pain: Secondary | ICD-10-CM | POA: Diagnosis not present

## 2019-06-18 DIAGNOSIS — R0789 Other chest pain: Secondary | ICD-10-CM | POA: Diagnosis not present

## 2019-06-18 DIAGNOSIS — R079 Chest pain, unspecified: Secondary | ICD-10-CM | POA: Diagnosis not present

## 2019-06-18 DIAGNOSIS — R509 Fever, unspecified: Secondary | ICD-10-CM | POA: Diagnosis not present

## 2019-06-18 DIAGNOSIS — U071 COVID-19: Secondary | ICD-10-CM | POA: Diagnosis not present

## 2019-06-18 DIAGNOSIS — I509 Heart failure, unspecified: Secondary | ICD-10-CM | POA: Insufficient documentation

## 2019-06-18 DIAGNOSIS — Z79899 Other long term (current) drug therapy: Secondary | ICD-10-CM | POA: Diagnosis not present

## 2019-06-18 HISTORY — DX: Heart failure, unspecified: I50.9

## 2019-06-18 LAB — BASIC METABOLIC PANEL
Anion gap: 11 (ref 5–15)
BUN: 8 mg/dL (ref 6–20)
CO2: 25 mmol/L (ref 22–32)
Calcium: 8.8 mg/dL — ABNORMAL LOW (ref 8.9–10.3)
Chloride: 102 mmol/L (ref 98–111)
Creatinine, Ser: 0.98 mg/dL (ref 0.61–1.24)
GFR calc Af Amer: >60 mL/min (ref >60)
GFR calc non Af Amer: >60 mL/min (ref >60)
Glucose, Bld: 114 mg/dL — ABNORMAL HIGH (ref 70–99)
Potassium: 4.3 mmol/L (ref 3.5–5.1)
Sodium: 138 mmol/L (ref 135–145)

## 2019-06-18 LAB — CBC
HCT: 39.6 % (ref 39.0–52.0)
Hemoglobin: 12.7 g/dL — ABNORMAL LOW (ref 13.0–17.0)
MCH: 29.5 pg (ref 26.0–34.0)
MCHC: 32.1 g/dL (ref 30.0–36.0)
MCV: 92.1 fL (ref 80.0–100.0)
Platelets: 207 10*3/uL (ref 150–400)
RBC: 4.3 MIL/uL (ref 4.22–5.81)
RDW: 12.9 % (ref 11.5–15.5)
WBC: 5.9 10*3/uL (ref 4.0–10.5)
nRBC: 0 % (ref 0.0–0.2)

## 2019-06-18 LAB — TROPONIN I (HIGH SENSITIVITY): Troponin I (High Sensitivity): <2 ng/L (ref <18)

## 2019-06-18 MED ORDER — ACETAMINOPHEN 325 MG PO TABS
650.0000 mg | ORAL_TABLET | Freq: Once | ORAL | Status: AC
  Administered 2019-06-18: 650 mg via ORAL
  Filled 2019-06-18: qty 2

## 2019-06-18 MED ORDER — SODIUM CHLORIDE 0.9% FLUSH: 3.0000 mL | Freq: Once | INTRAVENOUS | Status: DC

## 2019-06-18 NOTE — ED Notes (Signed)
[phone] pt is waiting in the car, please call when roomed.

## 2019-06-18 NOTE — ED Triage Notes (Signed)
[name] wants him to sit in the car , pt is sitting in the car and wants to be called.

## 2019-06-18 NOTE — ED Notes (Signed)
Called pts wife requesting that he come back in for blood work. Wife stated that pt is sick right now and it will be a minute.

## 2019-06-18 NOTE — ED Triage Notes (Addendum)
PT states hx of chf. States he feels his chest is congested since yesterday.  He's been spitting up brown sputum. States headache yesterday.  Pt goes to a methadone clinic.

## 2019-06-18 NOTE — Telephone Encounter (Signed)
pts wife called to report pt is dizzy, has a headache, short of breath to the point he cant talk, and has a fever. Patients wife is sending pt to the ED because of covid symptoms.

## 2019-06-18 NOTE — ED Notes (Signed)
Pts wife asking about wait time, made this NT aware of pt sitting in car.

## 2019-06-18 NOTE — ED Triage Notes (Signed)
Wife stated, the Dr. Javier Docker him tested for COVID

## 2019-06-19 ENCOUNTER — Emergency Department (HOSPITAL_COMMUNITY): Payer: BC Managed Care – PPO

## 2019-06-19 DIAGNOSIS — R509 Fever, unspecified: Secondary | ICD-10-CM | POA: Diagnosis not present

## 2019-06-19 DIAGNOSIS — R079 Chest pain, unspecified: Secondary | ICD-10-CM | POA: Diagnosis not present

## 2019-06-19 LAB — POC SARS CORONAVIRUS 2 AG -  ED: SARS Coronavirus 2 Ag: POSITIVE — AB

## 2019-06-19 LAB — TROPONIN I (HIGH SENSITIVITY): Troponin I (High Sensitivity): 3 ng/L (ref <18)

## 2019-06-19 NOTE — Discharge Instructions (Addendum)
Tylenol 1000 mg rotated with ibuprofen 600 mg every 6 hours as needed for pain or fever.  Drink plenty of fluids and get plenty of rest.  Isolate yourself at home until you are symptom-free +1-week.  Return to the emergency department if you develop severe chest pain, worsening breathing, or other new and concerning symptoms.        Infection Prevention Recommendations for Individuals Confirmed to have, or Being Evaluated for, 2019 Novel Coronavirus (COVID-19) Infection Who Receive Care at Home  Individuals who are confirmed to have, or are being evaluated for, COVID-19 should follow the prevention steps below until a healthcare provider or local or state health department says they can return to normal activities.  Stay home except to get medical care You should restrict activities outside your home, except for getting medical care. Do not go to work, school, or public areas, and do not use public transportation or taxis.  Call ahead before visiting your doctor Before your medical appointment, call the healthcare provider and tell them that you have, or are being evaluated for, COVID-19 infection. This will help the healthcare providers office take steps to keep other people from getting infected. Ask your healthcare provider to call the local or state health department.  Monitor your symptoms Seek prompt medical attention if your illness is worsening (e.g., difficulty breathing). Before going to your medical appointment, call the healthcare provider and tell them that you have, or are being evaluated for, COVID-19 infection. Ask your healthcare provider to call the local or state health department.  Wear a facemask You should wear a facemask that covers your nose and mouth when you are in the same room with other people and when you visit a healthcare provider. People who live with or visit you should also wear a facemask while they are in the same room with you.  Separate  yourself from other people in your home As much as possible, you should stay in a different room from other people in your home. Also, you should use a separate bathroom, if available.  Avoid sharing household items You should not share dishes, drinking glasses, cups, eating utensils, towels, bedding, or other items with other people in your home. After using these items, you should wash them thoroughly with soap and water.  Cover your coughs and sneezes Cover your mouth and nose with a tissue when you cough or sneeze, or you can cough or sneeze into your sleeve. Throw used tissues in a lined trash can, and immediately wash your hands with soap and water for at least 20 seconds or use an alcohol-based hand rub.  Wash your [employer] your hands often and thoroughly with soap and water for at least 20 seconds. You can use an alcohol-based hand sanitizer if soap and water are not available and if your hands are not visibly dirty. Avoid touching your eyes, nose, and mouth with unwashed hands.   Prevention Steps for Caregivers and Household Members of Individuals Confirmed to have, or Being Evaluated for, COVID-19 Infection Being Cared for in the Home  If you live with, or provide care at home for, a person confirmed to have, or being evaluated for, COVID-19 infection please follow these guidelines to prevent infection:  Follow healthcare providers instructions Make sure that you understand and can help the patient follow any healthcare provider instructions for all care.  Provide for the patients basic needs You should help the patient with basic needs in the home and provide support  for getting groceries, prescriptions, and other personal needs.  Monitor the patients symptoms If they are getting sicker, call his or her medical provider and tell them that the patient has, or is being evaluated for, COVID-19 infection. This will help the healthcare providers office take steps to keep  other people from getting infected. Ask the healthcare provider to call the local or state health department.  Limit the number of people who have contact with the patient If possible, have only one caregiver for the patient. Other household members should stay in another home or place of residence. If this is not possible, they should stay in another room, or be separated from the patient as much as possible. Use a separate bathroom, if available. Restrict visitors who do not have an essential need to be in the home.  Keep older adults, very young children, and other sick people away from the patient Keep older adults, very young children, and those who have compromised immune systems or chronic health conditions away from the patient. This includes people with chronic heart, lung, or kidney conditions, diabetes, and cancer.  Ensure good ventilation Make sure that shared spaces in the home have good air flow, such as from an air conditioner or an opened window, weather permitting.  Wash your hands often Wash your hands often and thoroughly with soap and water for at least 20 seconds. You can use an alcohol based hand sanitizer if soap and water are not available and if your hands are not visibly dirty. Avoid touching your eyes, nose, and mouth with unwashed hands. Use disposable paper towels to dry your hands. If not available, use dedicated cloth towels and replace them when they become wet.  Wear a facemask and gloves Wear a disposable facemask at all times in the room and gloves when you touch or have contact with the patients blood, body fluids, and/or secretions or excretions, such as sweat, saliva, sputum, nasal mucus, vomit, urine, or feces.  Ensure the mask fits over your nose and mouth tightly, and do not touch it during use. Throw out disposable facemasks and gloves after using them. Do not reuse. Wash your hands immediately after removing your facemask and gloves. If your  personal clothing becomes contaminated, carefully remove clothing and launder. Wash your hands after handling contaminated clothing. Place all used disposable facemasks, gloves, and other waste in a lined container before disposing them with other household waste. Remove gloves and wash your hands immediately after handling these items.  Do not share dishes, glasses, or other household items with the patient Avoid sharing household items. You should not share dishes, drinking glasses, cups, eating utensils, towels, bedding, or other items with a patient who is confirmed to have, or being evaluated for, COVID-19 infection. After the person uses these items, you should wash them thoroughly with soap and water.  Wash laundry thoroughly Immediately remove and wash clothes or bedding that have blood, body fluids, and/or secretions or excretions, such as sweat, saliva, sputum, nasal mucus, vomit, urine, or feces, on them. Wear gloves when handling laundry from the patient. Read and follow directions on labels of laundry or clothing items and detergent. In general, wash and dry with the warmest temperatures recommended on the label.  Clean all areas the individual has used often Clean all touchable surfaces, such as counters, tabletops, doorknobs, bathroom fixtures, toilets, phones, keyboards, tablets, and bedside tables, every day. Also, clean any surfaces that may have blood, body fluids, and/or secretions or excretions on  them. Wear gloves when cleaning surfaces the patient has come in contact with. Use a diluted bleach solution (e.g., dilute bleach with 1 part bleach and 10 parts water) or a household disinfectant with a label that says EPA-registered for coronaviruses. To make a bleach solution at home, add 1 tablespoon of bleach to 1 quart (4 cups) of water. For a larger supply, add  cup of bleach to 1 gallon (16 cups) of water. Read labels of cleaning products and follow recommendations provided on  product labels. Labels contain instructions for safe and effective use of the cleaning product including precautions you should take when applying the product, such as wearing gloves or eye protection and making sure you have good ventilation during use of the product. Remove gloves and wash hands immediately after cleaning.  Monitor yourself for signs and symptoms of illness Caregivers and household members are considered close contacts, should monitor their health, and will be asked to limit movement outside of the home to the extent possible. Follow the monitoring steps for close contacts listed on the symptom monitoring form.   ? If you have additional questions, contact your local health department or call the epidemiologist on call at [phone] (available 24/7). ? This guidance is subject to change. For the most up-to-date guidance from [facility], please refer to their website: [email or web address]

## 2019-06-19 NOTE — ED Provider Notes (Signed)
[facility] EMERGENCY DEPARTMENT Provider Note   CSN: [id number] Arrival date & time: 06/18/19  1413     History Chief Complaint  Patient presents with  . Chest Pain    Kenneth [name] is a 44 y.o. male.  Patient is a 44 year old male with past medical history of nonischemic cardiomyopathy, CHF, A. fib, PTSD.  He presents today for evaluation of chest congestion, tightness, and cough since yesterday.  Patient states he has been coughing up a brown-colored sputum.  He also describes a headache, but no fever.  He denies any contacts with Covid.  He spoke with his cardiologist who instructed him to come to the ER to be evaluated for CHF versus Covid.  The history is provided by the patient.  Chest Pain Pain location:  Substernal area Pain quality: tightness   Pain radiates to:  Does not radiate Pain severity:  Moderate Onset quality:  Sudden Duration:  2 days Timing:  Constant Progression:  Worsening Chronicity:  New Relieved by:  Nothing Worsened by:  Nothing      Past Medical History:  Diagnosis Date  . A-fib ([place])   . CHF (congestive heart failure) ([place])   . Frequent headaches   . Narcotic abuse ([place])   . PTSD (post-traumatic stress disorder)     Patient Active Problem List   Diagnosis Date Noted  . Atrial fibrillation with rapid ventricular response ([place]) 05/29/2018  . New onset a-fib ([place]) 05/28/2018  . Acute CHF (congestive heart failure) ([place]) 05/28/2018  . Methamphetamine abuse ([place]) 05/28/2018  . PTSD (post-traumatic stress disorder) 05/20/2015  . 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 Frequent headaches 05/20/2015    Past Surgical History:  Procedure Laterality Date  . CARDIOVERSION N/A 06/02/2018   Procedure: CARDIOVERSION;  Surgeon: Jolaine Artist, MD;  Location: [facility] ENDOSCOPY;  Service: Cardiovascular;  Laterality: N/A;  . RIGHT/LEFT HEART CATH AND CORONARY ANGIOGRAPHY N/A 06/01/2018   Procedure: RIGHT/LEFT HEART  CATH AND CORONARY ANGIOGRAPHY;  Surgeon: Jolaine Artist, MD;  Location: [facility] CV LAB;  Service: Cardiovascular;  Laterality: N/A;  . TEE WITHOUT CARDIOVERSION N/A 06/02/2018   Procedure: TRANSESOPHAGEAL ECHOCARDIOGRAM (TEE);  Surgeon: Jolaine Artist, MD;  Location[facility] ENDOSCOPY;  Service: Cardiovascular;  Laterality: N/A;  . TONSILLECTOMY         Family History  Problem Relation Age of Onset  . Miscarriages / [place] Brother   . Schizophrenia Brother     Social History   Tobacco Use  . Smoking status: Current Some Day Smoker    Packs/day: 1.00    Years: 0.50    Pack years: 0.50    Types: Cigarettes  . Smokeless tobacco: Former Systems developer    Types: Snuff  Substance Use Topics  . Alcohol use: No    Alcohol/week: 0.0 standard drinks  . Drug use: No    Types: Cocaine, Marijuana    Comment: no cocaine since 2018    Home Medications Prior to Admission medications   Medication Sig Start Date End Date Taking? Authorizing Provider  ALPRAZolam Duanne Moron) 1 MG tablet Take 1 mg by mouth daily as needed for anxiety.    [provider]  carvedilol (COREG) 3.125 MG tablet TAKE 1 TABLET (3.125 MG TOTAL) BY MOUTH 2 (TWO) TIMES DAILY WITH A MEAL. 05/01/19   Bensimhon, Shaune Pascal, MD  furosemide (LASIX) 40 MG tablet Take 1 tablet (40 mg total) by mouth as needed for fluid or edema. 07/06/18   Shirley Friar,  PA-C  sacubitril-valsartan (ENTRESTO) 24-26 MG Take 1 tablet by mouth 2 (two) times daily. 06/04/19   Bensimhon, Bevelyn Buckles, MD  spironolactone (ALDACTONE) 25 MG tablet Take 1 tablet (25 mg total) by mouth daily. 07/07/18   Bensimhon, Bevelyn Buckles, MD    Allergies    Patient has no known allergies.  Review of Systems   Review of Systems  Cardiovascular: Positive for chest pain.  All other systems reviewed and are negative.   Physical Exam Updated Vital Signs BP (!) 146/95 (BP Location: Left Arm)   Pulse 91   Temp 98.3 F (36.8 C) (Oral)   Resp 14   Ht 6'  2" (1.88 m)   Wt 83.9 kg   SpO2 100%   BMI 23.75 kg/m   Physical Exam Vitals and nursing note reviewed.  Constitutional:      General: He is not in acute distress.    Appearance: He is well-developed. He is not diaphoretic.  HENT:     Head: Normocephalic and atraumatic.  Cardiovascular:     Rate and Rhythm: Normal rate and regular rhythm.     Heart sounds: No murmur. No friction rub.  Pulmonary:     Effort: Pulmonary effort is normal. No respiratory distress.     Breath sounds: Normal breath sounds. No wheezing or rales.  Abdominal:     General: Bowel sounds are normal. There is no distension.     Palpations: Abdomen is soft.     Tenderness: There is no abdominal tenderness.  Musculoskeletal:        General: Normal range of motion.     Cervical back: Normal range of motion and neck supple.     Right lower leg: No tenderness. No edema.     Left lower leg: No tenderness. No edema.  Skin:    General: Skin is warm and dry.  Neurological:     Mental Status: He is alert and oriented to person, place, and time.     Coordination: Coordination normal.     ED Results / Procedures / Treatments   Labs (all labs ordered are listed, but only abnormal results are displayed) Labs Reviewed  BASIC METABOLIC PANEL - Abnormal; Notable for the following components:      Result Value   Glucose, Bld 114 (*)    Calcium 8.8 (*)    All other components within normal limits  CBC - Abnormal; Notable for the following components:   Hemoglobin 12.7 (*)    All other components within normal limits  POC SARS CORONAVIRUS 2 AG -  ED  TROPONIN I (HIGH SENSITIVITY)  TROPONIN I (HIGH SENSITIVITY)    EKG EKG Interpretation  Date/Time:  Monday June 18 2019 14:23:55 EST Ventricular Rate:  93 PR Interval:  184 QRS Duration: 106 QT Interval:  354 QTC Calculation: 440 R Axis:   69 Text Interpretation: Normal sinus rhythm T wave abnormality, consider inferior ischemia Abnormal ECG No  significant change since 07/20/2018 Reconfirmed by Geoffery Lyons ([id number]) on 06/19/2019 2:33:33 AM   Radiology No results found.  Procedures Procedures (including critical care time)  Medications Ordered in ED Medications  sodium chloride flush (NS) 0.9 % injection 3 mL (has no administration in time range)  acetaminophen (TYLENOL) tablet 650 mg (650 mg Oral Given 06/18/19 1807)    ED Course  I have reviewed the triage vital signs and the nursing notes.  Pertinent labs & imaging results that were available during my care of the patient were reviewed by  me and considered in my medical decision making (see chart for details).    MDM Rules/Calculators/A&P  Patient is a 44 year old male with history of nonischemic cardiomyopathy presenting today with complaints of chest discomfort and URI symptoms.  His work-up today shows no evidence for a cardiac etiology.  His troponin is negative x2 and EKG is unchanged.  Chest x-ray shows no evidence for CHF or other cardiopulmonary abnormality.  Patient's rapid Covid test did return as positive.  Patient is in no respiratory distress and his oxygen saturations are currently 96%.  I see no indication for admission.  Patient will be discharged with advice to quarantine at home, take over-the-counter medications as needed, and return if symptoms worsen or change.  Final Clinical Impression(s) / ED Diagnoses Final diagnoses:  None    Rx / DC Orders ED Discharge Orders    None       Geoffery Lyons, MD 06/19/19 [phone]

## 2019-07-02 ENCOUNTER — Telehealth (HOSPITAL_COMMUNITY): Payer: Self-pay | Admitting: Pharmacy Technician

## 2019-07-02 NOTE — Telephone Encounter (Signed)
Patient Advocate Encounter   Received notification from Medicaid that prior authorization for Kenneth [name] is required.   PA submitted on [employer] Key [id number] W Status is pending   Will continue to follow.  Archer Asa, CPhT

## 2019-07-04 NOTE — Telephone Encounter (Signed)
Had to resubmit PA for Entresto on [employer]CT[employer]acks.  Confirmation: [id number] W  Will follow up .  Archer Asa, CPhT

## 2019-07-11 NOTE — Telephone Encounter (Signed)
Advanced Heart Failure Patient Advocate Encounter  Prior Authorization for Sherryll Burger has been approved.    PA# [id number] Effective dates: 07/04/19 through 06/28/20  Patients co-pay is $3.00

## 2019-08-08 ENCOUNTER — Other Ambulatory Visit (HOSPITAL_COMMUNITY): Payer: Self-pay

## 2019-08-08 MED ORDER — SPIRONOLACTONE 25 MG PO TABS: 25.0000 mg | ORAL_TABLET | Freq: Every day | ORAL | 6 refills | Status: DC

## 2019-09-13 ENCOUNTER — Other Ambulatory Visit (HOSPITAL_COMMUNITY): Payer: Self-pay | Admitting: Internal Medicine

## 2019-10-28 ENCOUNTER — Ambulatory Visit (HOSPITAL_COMMUNITY)
Admission: EM | Admit: 2019-10-28 | Discharge: 2019-10-28 | Disposition: A | Discharge status: Home / Self Care | Payer: Medicaid Other | Attending: Emergency Medicine | Admitting: Emergency Medicine

## 2019-10-28 ENCOUNTER — Encounter (HOSPITAL_COMMUNITY): Payer: Self-pay | Admitting: Emergency Medicine

## 2019-10-28 ENCOUNTER — Other Ambulatory Visit: Payer: Self-pay

## 2019-10-28 DIAGNOSIS — J020 Streptococcal pharyngitis: Secondary | ICD-10-CM

## 2019-10-28 DIAGNOSIS — R0982 Postnasal drip: Secondary | ICD-10-CM

## 2019-10-28 LAB — POCT RAPID STREP A: Streptococcus, Group A Screen (Direct): POSITIVE — AB

## 2019-10-28 MED ORDER — FLUTICASONE PROPIONATE 50 MCG/ACT NA SUSP: 2.0000 | Freq: Every day | NASAL | 0 refills | Status: DC

## 2019-10-28 MED ORDER — PENICILLIN G BENZATHINE 1200000 UNIT/2ML IM SUSP
1.2000 10*6.[IU] | Freq: Once | INTRAMUSCULAR | Status: AC
  Administered 2019-10-28: 1.2 10*6.[IU] via INTRAMUSCULAR

## 2019-10-28 MED ORDER — PENICILLIN G BENZATHINE 1200000 UNIT/2ML IM SUSP
INTRAMUSCULAR | Status: AC
  Filled 2019-10-28: qty 2

## 2019-10-28 NOTE — Discharge Instructions (Addendum)
Your strep was positive, so we have treated you with a shot of penicillin.  1 gram of Tylenol  3-4 times a day as needed for pain.  Make sure you drink plenty of extra fluids.  Some people find salt water gargles and  Traditional Medicinal's "Throat Coat" tea helpful. Take 5 mL of liquid Benadryl and 5 mL of Maalox. Mix it together, and then hold it in your mouth for as long as you can and then swallow. You may do this 4 times a day.    Flonase, saline nasal irrigation with a Lloyd Huger med rinse and distilled water as often as you want for your nasal congestion/postnasal drip.  Follow the directions on the box.  This will also help with your throat.  Below is a list of primary care practices who are taking new patients for you to follow-up with.  [facility] internal medicine clinic Ground Floor - [facility], [address] [place], [place], [place] [address] [phone]  [facility] Primary Care at [facility] [address] [place] [address] [phone]  [facility] [address] [place], [place] [address] [phone]  [facility] [phone] [address] [place] [address]  [facility]: [address] [place] [address]  [phone]  [facility]: [address] [place] [address]   [phone]  [facility]: [address] [place] [address]  [phone]  [facility] primary [facility] : [address] [place] [address] [phone]  [facility] Primary Care: [address] [place] [address] [phone]  [facility]: [address] [place] [address] [phone]  Dr. Oneal Grout [address] [facility] [place] [address]  [phone]  Dr.  Jackie Plum, [facility]. [address] [place], [place] [address]  [phone]  Go to www.goodrx.com to look up your medications. This will give you a list of where you can find your prescriptions at the most affordable prices. Or ask the pharmacist what the cash price is, or if they have any other discount programs available to help make your medication more affordable. This can be less expensive than what you would pay with insurance.

## 2019-10-28 NOTE — ED Triage Notes (Addendum)
Pt sore throat and fever, has hx of strep. Pt had COVID 6 months ago and states does not feel the same

## 2019-10-28 NOTE — ED Provider Notes (Signed)
HPI  SUBJECTIVE:  Patient reports sore throat starting 2 days ago. Sx worse with swallowing.  Sx better with BC powders.  Has been taking Tylenol w/ o relief.  + Fever tmax 101 + Swollen neck glands   No neck stiffness  +Cough + nasal congestion, postnasal drip + Myalgias + Headache No Rash  No loss of taste or smell No shortness of breath or difficulty breathing No nausea, vomiting No diarrhea No abdominal pain     No Recent Strep, mono, COVID exposure No reflux sxs No Allergy sxs  No Breathing difficulty, voice changes, sensation of throat swelling shut No Drooling No Trismus No abx in past month. + antipyretic in past 4-6 hrs- took Tylenol Pt is not a smoker- quit 8 mo ago.  No recent illicit drug use. Past medical history of atrial fibrillation on Eliquis, narcotic abuse, methamphetamine abuse, congestive heart failure, frequent strep status post tonsillectomy, Covid and GERD.  He states this feels identical to previous episodes of strep throat.  States it does not feel like Covid.  No history of mono. PMD: None  Past Medical History:  Diagnosis Date  . A-fib ([place])   . CHF (congestive heart failure) ([place])   . Frequent headaches   . Narcotic abuse ([place])   . PTSD (post-traumatic stress disorder)     Past Surgical History:  Procedure Laterality Date  . CARDIOVERSION N/A 06/02/2018   Procedure: CARDIOVERSION;  Surgeon: Jolaine Artist, MD;  Location: [facility] ENDOSCOPY;  Service: Cardiovascular;  Laterality: N/A;  . RIGHT/LEFT HEART CATH AND CORONARY ANGIOGRAPHY N/A 06/01/2018   Procedure: RIGHT/LEFT HEART CATH AND CORONARY ANGIOGRAPHY;  Surgeon: Jolaine Artist, MD;  Location: [place] CV LAB;  Service: Cardiovascular;  Laterality: N/A;  . TEE WITHOUT CARDIOVERSION N/A 06/02/2018   Procedure: TRANSESOPHAGEAL ECHOCARDIOGRAM (TEE);  Surgeon: Jolaine Artist, MD;  Location: [facility] ENDOSCOPY;  Service: Cardiovascular;  Laterality: N/A;  . TONSILLECTOMY       Family History  Problem Relation Age of Onset  . Miscarriages / [place] Brother   . Schizophrenia Brother     Social History   Tobacco Use  . Smoking status: Current Some Day Smoker    Packs/day: 1.00    Years: 0.50    Pack years: 0.50    Types: Cigarettes  . Smokeless tobacco: Former Systems developer    Types: Snuff  Substance Use Topics  . Alcohol use: No    Alcohol/week: 0.0 standard drinks  . Drug use: No    Types: Cocaine, Marijuana    Comment: no cocaine since 2018     Current Facility-Administered Medications:  .  penicillin g benzathine (BICILLIN LA) 1200000 UNIT/2ML injection 1.2 Million Units, 1.2 Million Units, Intramuscular, Once, Melynda Ripple, MD  Current Outpatient Medications:  .  ALPRAZolam (XANAX) 1 MG tablet, Take 1 mg by mouth daily as needed for anxiety., Disp: , Rfl:  .  carvedilol (COREG) 3.125 MG tablet, TAKE 1 TABLET (3.125 MG TOTAL) BY MOUTH 2 (TWO) TIMES DAILY WITH A MEAL., Disp: 180 tablet, Rfl: 3 .  fluticasone (FLONASE) 50 MCG/ACT nasal spray, Place 2 sprays into both nostrils daily., Disp: 16 g, Rfl: 0 .  furosemide (LASIX) 40 MG tablet, Take 1 tablet (40 mg total) by mouth as needed for fluid or edema., Disp: 30 tablet, Rfl: 6 .  sacubitril-valsartan (ENTRESTO) 24-26 MG, Take 1 tablet by mouth 2 (two) times daily., Disp: 60 tablet, Rfl: 6 .  spironolactone (ALDACTONE) 25 MG tablet, Take 1 tablet (25 mg  total) by mouth daily., Disp: 30 tablet, Rfl: 6  No Known Allergies   ROS  As noted in HPI.   Physical Exam  BP 93/64   Pulse 83   Temp 98.2 F (36.8 C)   Resp 18   SpO2 100%   Constitutional: Well developed, well nourished, no acute distress Eyes:  EOMI, conjunctiva normal bilaterally HENT: Normocephalic, atraumatic,mucus membranes moist. + nasal congestion + erythematous oropharynx.  Tonsils absent.  Extensive postnasal drip.  Uvula midline.  Normal voice.  No drooling, trismus, stridor. Respiratory: Normal inspiratory  effort Cardiovascular: Normal rate, regular rhythm, no murmurs, rubs, gallops GI: nondistended, nontender. No appreciable splenomegaly skin: No rash, skin intact Lymph:  + Anterior cervical LN.  No posterior cervical lymphadenopathy Musculoskeletal: no deformities Neurologic: Alert & oriented x 3, no focal neuro deficits Psychiatric: Speech and behavior appropriate.  ED Course   Medications  penicillin g benzathine (BICILLIN LA) 1200000 UNIT/2ML injection 1.2 Million Units (has no administration in time range)    Orders Placed This Encounter  Procedures  . POCT rapid strep A [facility])    Standing Status:   Standing    Number of Occurrences:   1    Results for orders placed or performed during the hospital encounter of 10/28/19 (from the past 24 hour(s))  POCT rapid strep A [facility])     Status: Abnormal   Collection Time: 10/28/19  6:05 PM  Result Value Ref Range   Streptococcus, Group A Screen (Direct) POSITIVE (A) NEGATIVE   No results found.  ED Clinical Impression  1. Strep pharyngitis   2. Post-nasal drip     ED Assessment/Plan  Rapid strep positive.  Giving penicillin 1,200,000 units IM x1 here.  Home with  Tylenol, Benadryl/Maalox mixture.  We will also send home with Flonase, saline nasal irrigation for the postnasal drip.  Patient to followup with PMD of choice when necessary, will refer to local primary care resources.  Discussed labs,  MDM, plan and followup with patient. Discussed sn/sx that should prompt return to the ED. patient agrees with plan.   Meds ordered this encounter  Medications  . penicillin g benzathine (BICILLIN LA) 1200000 UNIT/2ML injection 1.2 Million Units    Order Specific Question:   Antibiotic Indication:    Answer:   Pharyngitis  . fluticasone (FLONASE) 50 MCG/ACT nasal spray    Sig: Place 2 sprays into both nostrils daily.    Dispense:  16 g    Refill:  0     *This clinic note was created using [employer]. Therefore, there may be occasional mistakes despite careful proofreading.     Domenick Gong, MD 10/28/19 1836

## 2019-12-04 ENCOUNTER — Telehealth (HOSPITAL_COMMUNITY): Payer: Self-pay | Admitting: Vascular Surgery

## 2019-12-04 NOTE — Telephone Encounter (Signed)
Returned pt wife's call to find out when her husbands appt is , left VM with appt date and time

## 2019-12-05 ENCOUNTER — Other Ambulatory Visit: Payer: Self-pay

## 2019-12-05 ENCOUNTER — Encounter (HOSPITAL_COMMUNITY): Payer: Self-pay | Admitting: Internal Medicine

## 2019-12-05 ENCOUNTER — Ambulatory Visit (HOSPITAL_COMMUNITY)
Admission: RE | Admit: 2019-12-05 | Discharge: 2019-12-05 | Disposition: A | Discharge status: Home / Self Care | Payer: PRIVATE HEALTH INSURANCE | Source: Ambulatory Visit | Attending: Internal Medicine | Admitting: Internal Medicine

## 2019-12-05 VITALS — BP 97/58 | HR 80 | Wt 191.0 lb

## 2019-12-05 DIAGNOSIS — I480 Paroxysmal atrial fibrillation: Secondary | ICD-10-CM | POA: Diagnosis not present

## 2019-12-05 DIAGNOSIS — I5022 Chronic systolic (congestive) heart failure: Secondary | ICD-10-CM | POA: Diagnosis present

## 2019-12-05 DIAGNOSIS — I428 Other cardiomyopathies: Secondary | ICD-10-CM | POA: Insufficient documentation

## 2019-12-05 DIAGNOSIS — F17210 Nicotine dependence, cigarettes, uncomplicated: Secondary | ICD-10-CM | POA: Insufficient documentation

## 2019-12-05 DIAGNOSIS — Z79899 Other long term (current) drug therapy: Secondary | ICD-10-CM | POA: Diagnosis not present

## 2019-12-05 LAB — BASIC METABOLIC PANEL
Anion gap: 8 (ref 5–15)
BUN: 10 mg/dL (ref 6–20)
CO2: 28 mmol/L (ref 22–32)
Calcium: 8.8 mg/dL — ABNORMAL LOW (ref 8.9–10.3)
Chloride: 103 mmol/L (ref 98–111)
Creatinine, Ser: 0.91 mg/dL (ref 0.61–1.24)
GFR calc Af Amer: >60 mL/min (ref >60)
GFR calc non Af Amer: >60 mL/min (ref >60)
Glucose, Bld: 128 mg/dL — ABNORMAL HIGH (ref 70–99)
Potassium: 4.3 mmol/L (ref 3.5–5.1)
Sodium: 139 mmol/L (ref 135–145)

## 2019-12-05 LAB — CBC
HCT: 37.8 % — ABNORMAL LOW (ref 39.0–52.0)
Hemoglobin: 12 g/dL — ABNORMAL LOW (ref 13.0–17.0)
MCH: 28.9 pg (ref 26.0–34.0)
MCHC: 31.7 g/dL (ref 30.0–36.0)
MCV: 91.1 fL (ref 80.0–100.0)
Platelets: 247 10*3/uL (ref 150–400)
RBC: 4.15 MIL/uL — ABNORMAL LOW (ref 4.22–5.81)
RDW: 13 % (ref 11.5–15.5)
WBC: 5.1 10*3/uL (ref 4.0–10.5)
nRBC: 0 % (ref 0.0–0.2)

## 2019-12-05 NOTE — Progress Notes (Signed)
Advanced Heart Failure Clinic Note   Date:  12/05/2019   ID:  Kenneth [name], DOB 04/08/1976, MRN [id number]  Location: Home  Provider location: [place] [facility] Type of Visit: Established patient  PCP:  Patient, No Pcp Per  Cardiologist:  Arvilla Meres, MD Primary HF: Dimetrius [place]  Chief Complaint: Heart Failure follow-up   History of Present Illness:  Kenneth [name] is a 44 y.o. male w/ h/o chronic systolic HF, paroxysmal Afib, polysubstance abuse, and chronic suboxone use.  Admitted 12/15 - 06/04/18 with new onset systolic CHF. Cath with NICM, EF 20% (normal cors) thought to be most likely due to combination of Afib with RVR and polysubstance abuse.  Medications adjusted as tolerated. He underwent TEE/DCCV 06/02/18 and maintained NSR to discharge.   Echo 6/20: EF 55-60% RV normal   He presents today for f/u. Feels good. Remains active. Cutting trees. No CP or SOB. Quit smoking x 1 year. No edema, orthopnea or PND. Feels dizzy when standiing on occasion. Only taking lasix about 1 tablet every 2-3 months. Remains on methadone.    Past Medical History:  Diagnosis Date  . A-fib ([facility])   . CHF (congestive heart failure) ([facility])   . Frequent headaches   . Narcotic abuse ([facility])   . PTSD (post-traumatic stress disorder)    Past Surgical History:  Procedure Laterality Date  . CARDIOVERSION N/A 06/02/2018   Procedure: CARDIOVERSION;  Surgeon: Dolores Patty, MD;  Location: [facility] ENDOSCOPY;  Service: Cardiovascular;  Laterality: N/A;  . RIGHT/LEFT HEART CATH AND CORONARY ANGIOGRAPHY N/A 06/01/2018   Procedure: RIGHT/LEFT HEART CATH AND CORONARY ANGIOGRAPHY;  Surgeon: Dolores Patty, MD;  Location: [facility] INVASIVE CV LAB;  Service: Cardiovascular;  Laterality: N/A;  . TEE WITHOUT CARDIOVERSION N/A 06/02/2018   Procedure: TRANSESOPHAGEAL ECHOCARDIOGRAM (TEE);  Surgeon: Dolores Patty, MD;  Location: [facility] ENDOSCOPY;  Service: Cardiovascular;  Laterality:  N/A;  . TONSILLECTOMY       Current Outpatient Medications  Medication Sig Dispense Refill  . ALPRAZolam (XANAX) 1 MG tablet Take 1 mg by mouth daily as needed for anxiety.    . carvedilol (COREG) 3.125 MG tablet TAKE 1 TABLET (3.125 MG TOTAL) BY MOUTH 2 (TWO) TIMES DAILY WITH A MEAL. 180 tablet 3  . fluticasone (FLONASE) 50 MCG/ACT nasal spray Place 2 sprays into both nostrils daily. 16 g 0  . furosemide (LASIX) 40 MG tablet Take 1 tablet (40 mg total) by mouth as needed for fluid or edema. 30 tablet 6  . sacubitril-valsartan (ENTRESTO) 24-26 MG Take 1 tablet by mouth 2 (two) times daily. 60 tablet 6  . spironolactone (ALDACTONE) 25 MG tablet Take 1 tablet (25 mg total) by mouth daily. 30 tablet 6   No current facility-administered medications for this encounter.    Allergies:   Patient has no known allergies.   Social History:  The patient  reports that he has been smoking cigarettes. He has a 0.50 pack-year smoking history. He has quit using smokeless tobacco.  His smokeless tobacco use included snuff. He reports that he does not drink alcohol and does not use drugs.   Family History:  The patient's family history includes Miscarriages / Stillbirths in his brother; Schizophrenia in his brother.   ROS:  Please see the history of present illness.   All other systems are personally reviewed and negative.   Vitals:   12/05/19 1018  BP: (!) 97/58  Pulse: 80  SpO2: 95%  Weight: 86.6 kg (  191 lb)    Exam:  General:  Well appearing. No resp difficulty HEENT: normal Neck: supple. no JVD. Carotids 2+ bilat; no bruits. No lymphadenopathy or thryomegaly appreciated. Cor: PMI nondisplaced. Regular rate & rhythm. No rubs, gallops or murmurs. Lungs: clear Abdomen: soft, nontender, nondistended. No hepatosplenomegaly. No bruits or masses. Good bowel sounds. Extremities: no cyanosis, clubbing, rash, edema Neuro: alert & orientedx3, cranial nerves grossly intact. moves all 4 extremities w/o  difficulty. Affect pleasant   Recent Labs: 06/18/2019: BUN 8; Creatinine, Ser 0.98; Hemoglobin 12.7; Platelets 207; Potassium 4.3; Sodium 138  Personally reviewed   Wt Readings from Last 3 Encounters:  12/05/19 86.6 kg (191 lb)  06/18/19 83.9 kg (185 lb)  03/08/19 85 kg (187 lb 6.4 oz)      ASSESSMENT AND PLAN:  1. Chronic systolic CHF, NICM - ECHO 05/29/18 LVEF 20%.Likely in setting of chronic substance abuse and Afib RVR.  - NICM by cath 06/01/18.  - NYHA I - Echo 6/20 EF 55-60%.  - Looks good NYHA I. Volume status low.  - Continue Entresto 24/26 mg BID unable to tolerate higher doses due to severe dizziness - Given low BP and orthostasis will stop spiro 25  - Continue coreg 3.125 mg BID\ - Will repeat echo to ensure EF stability  2. Paroxysmal Afib  -CHA2DS2-VASc2 (CHF, HTN). - Given that he has not had further AF and HF resolved and he works with chainsaws we decided stop Eliquis  3. Substance abuse - UDS 05/28/18 positive for opiates, BZDs, amphetamines, and THC. - He is on Methadone   Signed, Glori Bickers, MD  12/05/2019 10:41 AM  Advanced Heart Failure [place] [address] [phone] (office) [phone] (fax)

## 2019-12-05 NOTE — Addendum Note (Signed)
Encounter addended by: Chinita Pester, CMA on: 12/05/2019 10:59 AM  Actions taken: Charge Capture section accepted, Clinical Note Signed, Follow-up modified

## 2019-12-05 NOTE — Patient Instructions (Signed)
Labs done today. We will contact you only if your labs are abnormal.  STOP Spironolactone   No other medication changes were made. Please continue all current medications as prescribed.  Your physician has requested that you have an echocardiogram. Echocardiography is a painless test that uses sound waves to create images of your heart. It provides your doctor with information about the size and shape of your heart and how well your heart's chambers and valves are working. This procedure takes approximately one hour. There are no restrictions for this procedure. This has been scheduled for Friday June 25th, 2021 at 8am. Please check in where you checked in today for your appointment.  Your physician recommends that you schedule a follow-up appointment in: 1 year. Our office will contact you to schedule an appointment at a later time.  If you have any questions or concerns before your next appointment please send [place] a message through [place] or call our office at [phone].    TO LEAVE A MESSAGE FOR THE NURSE SELECT OPTION 2, PLEASE LEAVE A MESSAGE INCLUDING: . YOUR NAME . DATE OF BIRTH . CALL BACK NUMBER . REASON FOR CALL**this is important as we prioritize the call backs  YOU WILL RECEIVE A CALL BACK THE SAME DAY AS LONG AS YOU CALL BEFORE 4:00 PM   At the [facility] Clinic, you and your health needs are our priority. As part of our continuing mission to provide you with exceptional heart care, we have created designated Provider Care Teams. These Care Teams include your primary Cardiologist (physician) and Advanced Practice Providers (APPs- Physician Assistants and Nurse Practitioners) who all work together to provide you with the care you need, when you need it.   You may see any of the following providers on your designated Care Team at your next follow up: Marland Kitchen Dr Arvilla Meres . Dr Marca Ancona . Tonye Becket, NP . Robbie Lis, PA . Karle Plumber, PharmD   Please  be sure to bring in all your medications bottles to every appointment.

## 2019-12-05 NOTE — Addendum Note (Signed)
Encounter addended by: Chinita Pester, CMA on: 12/05/2019 10:56 AM  Actions taken: Medication long-term status modified, Order list changed, Diagnosis association updated, Order Reconciliation Section accessed

## 2019-12-07 ENCOUNTER — Other Ambulatory Visit: Payer: Self-pay

## 2019-12-07 ENCOUNTER — Ambulatory Visit (HOSPITAL_COMMUNITY)
Admission: RE | Admit: 2019-12-07 | Discharge: 2019-12-07 | Disposition: A | Discharge status: Home / Self Care | Payer: PRIVATE HEALTH INSURANCE | Source: Ambulatory Visit | Attending: Cardiology | Admitting: Cardiology

## 2019-12-07 DIAGNOSIS — I5022 Chronic systolic (congestive) heart failure: Secondary | ICD-10-CM

## 2019-12-07 NOTE — Progress Notes (Signed)
  Echocardiogram 2D Echocardiogram has been performed.  Kenneth [name] 12/07/2019, 8:45 AM

## 2020-03-17 ENCOUNTER — Other Ambulatory Visit (HOSPITAL_COMMUNITY): Payer: Self-pay | Admitting: Internal Medicine

## 2020-04-11 IMAGING — CT CT ABD-PELV W/ CM
2 of 3 series · 14 of 36 positions shown, 17 images · IV contrast (omnipaque)
Comparison: None.

CLINICAL DATA: 42-year-old male with a history of shortness of
breath and abdominal pain

EXAM:
CT CHEST, ABDOMEN, AND PELVIS WITH CONTRAST
TECHNIQUE: Multidetector CT imaging of the chest, abdomen and pelvis was
performed following the standard protocol during bolus
administration of intravenous contrast.
CONTRAST:  75mL OMNIPAQUE IOHEXOL 300 MG/ML SOLN; 100mL OMNIPAQUE
IOHEXOL 300 MG/ML SOLN

[Series 3: thorax 2.0 i31f 2 · axial · 0.79mm/px · z∈[+971,+1269]mm · 11 of 175 slices shown, 14 images]
[im 13/175  mediastinal]
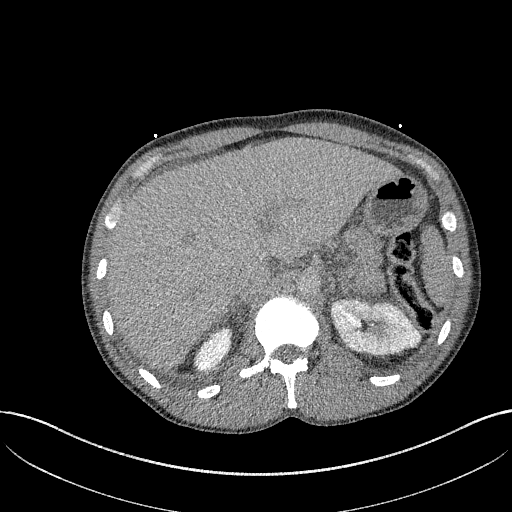
[im 13/175  lung]
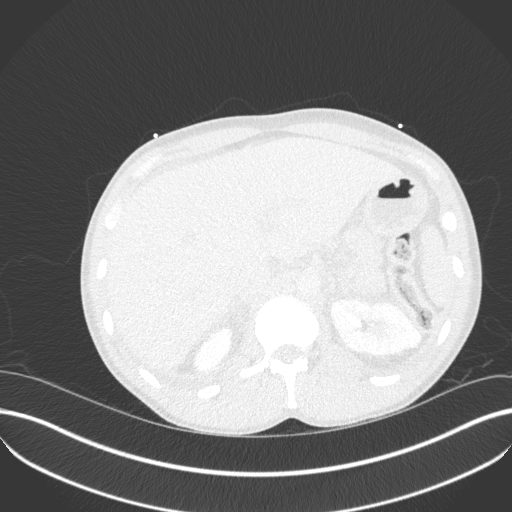
[im 26/175  lung]
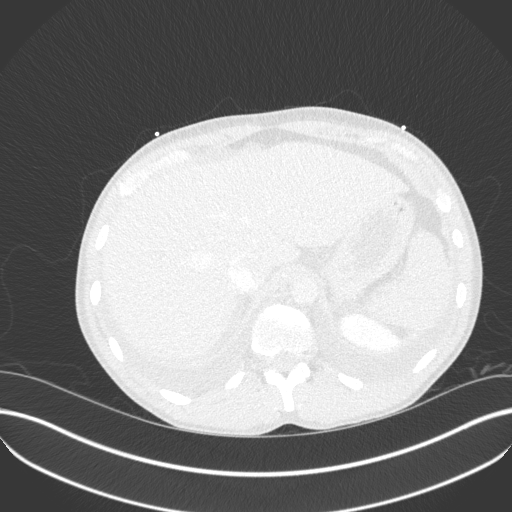
[im 39/175  lung]
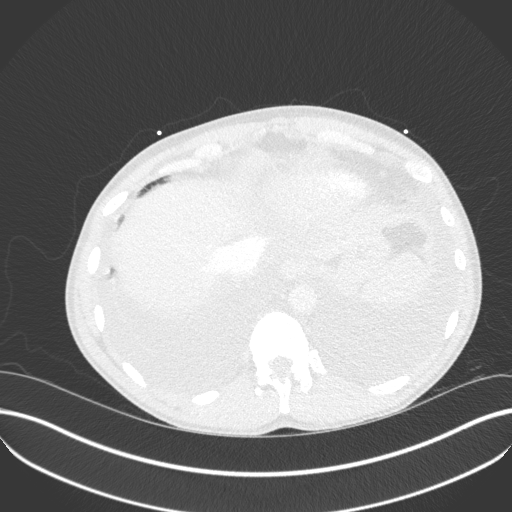
[im 59/175  lung]
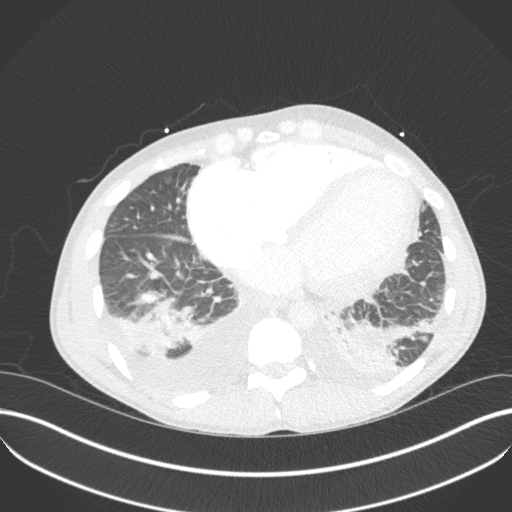
[im 71/175  mediastinal]
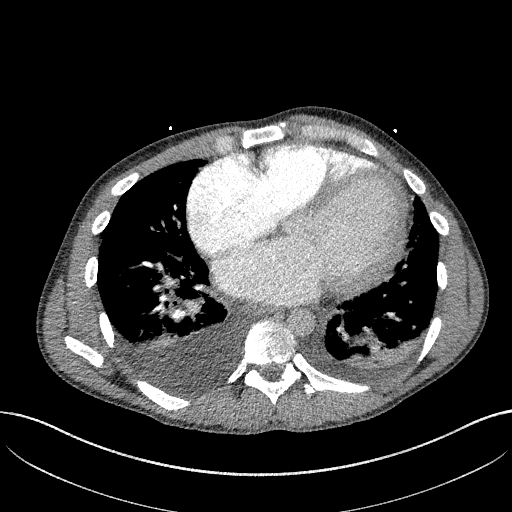
[im 71/175  lung]
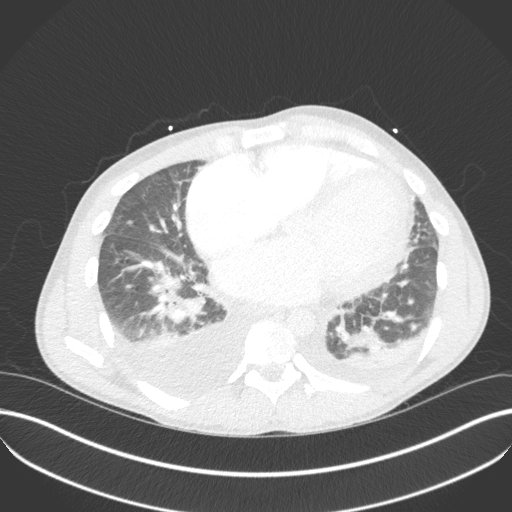
[im 91/175  lung]
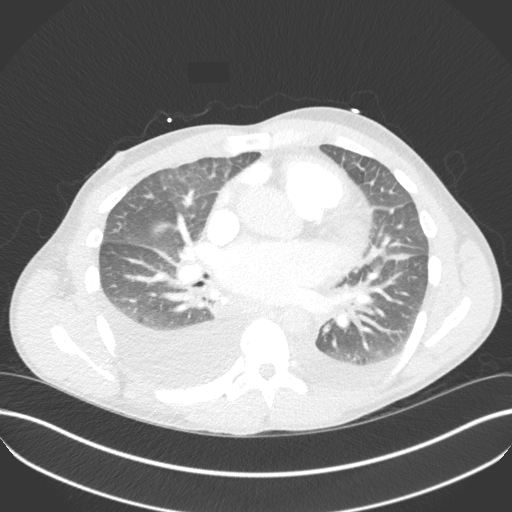
[im 104/175  lung]
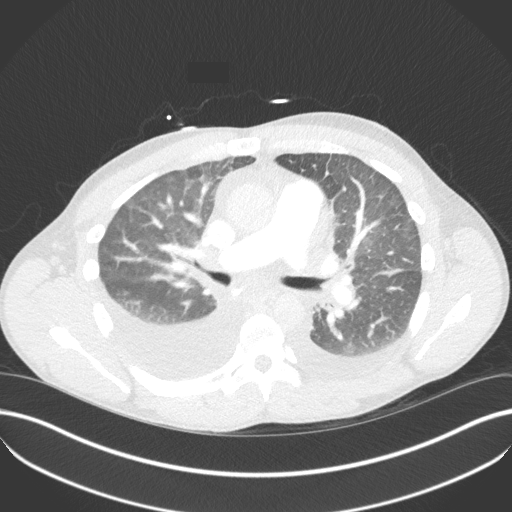
[im 117/175  lung]
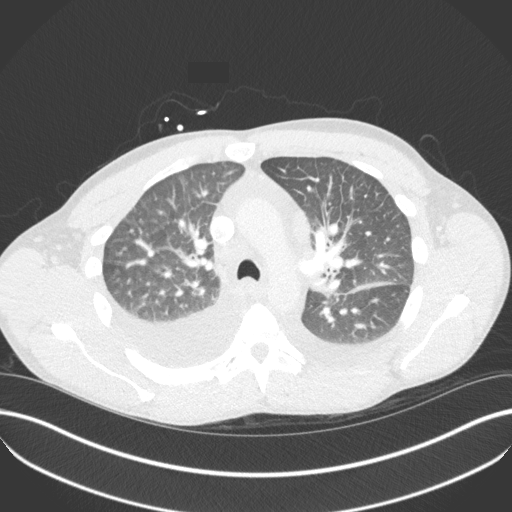
[im 136/175  mediastinal]
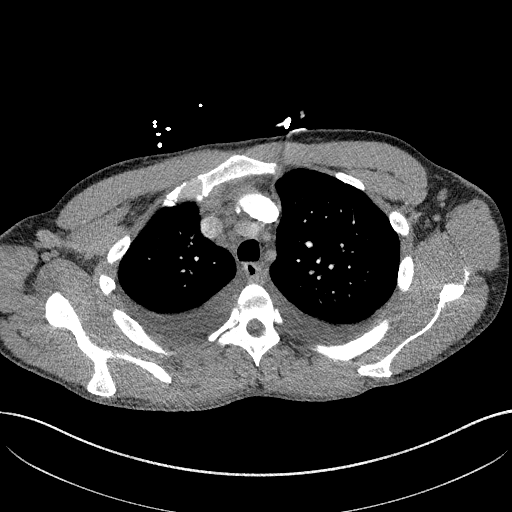
[im 136/175  lung]
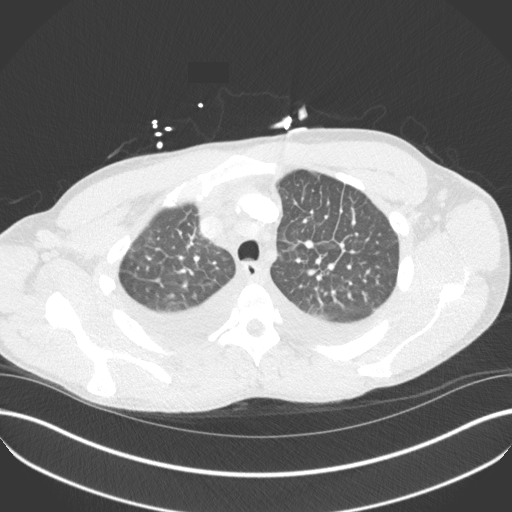
[im 149/175  lung]
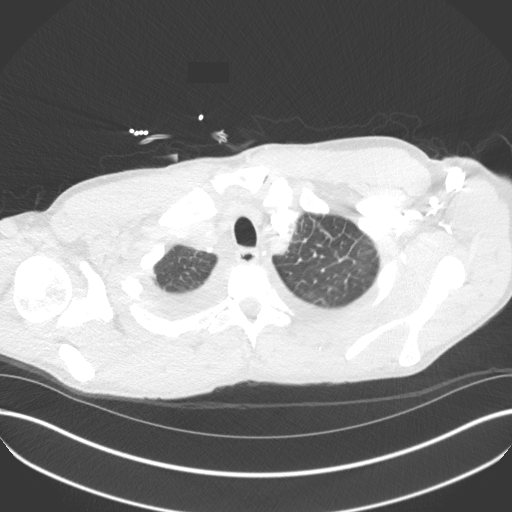
[im 162/175  lung]
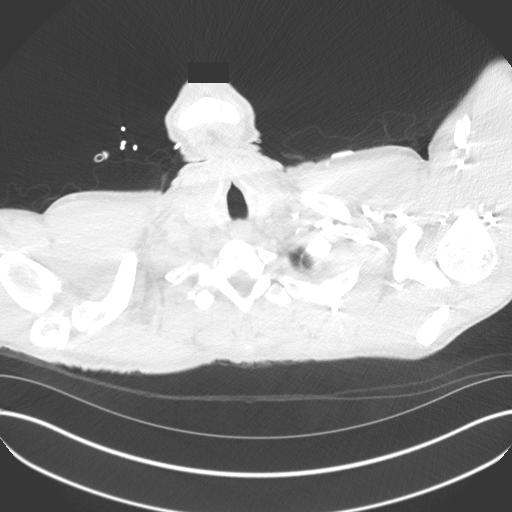

[Series 5: coronal · coronal · 0.69mm/px · 3 of 123 slices shown]
[im 25/123  lung]
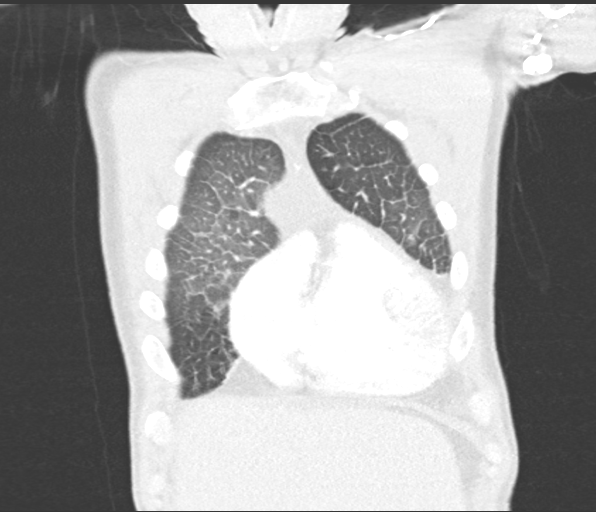
[im 49/123  lung]
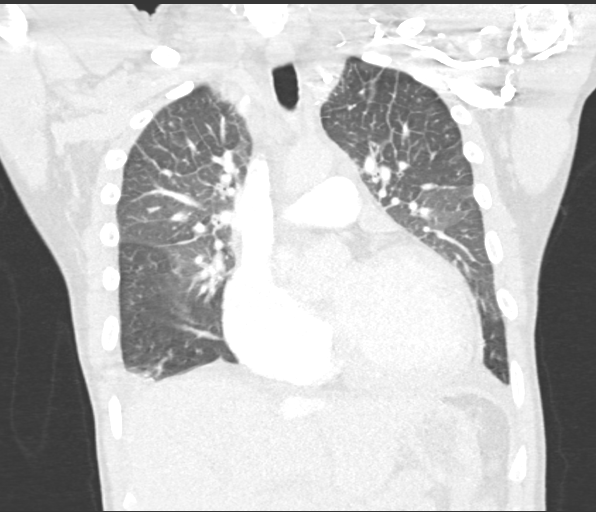
[im 74/123  lung]
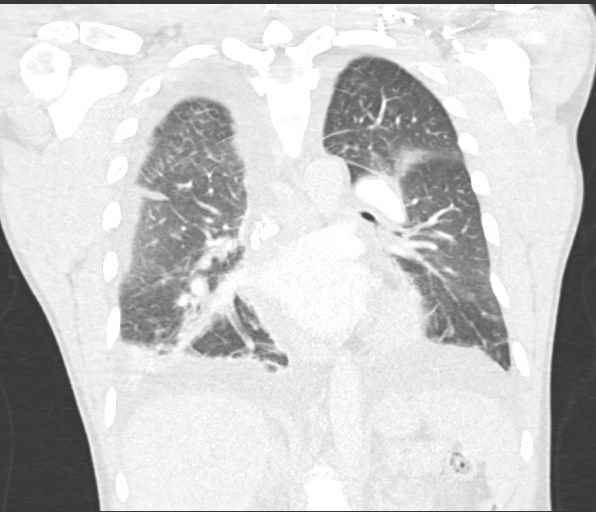

[14 of 36 positions shown; findings below may reference images not displayed]

FINDINGS: CT CHEST FINDINGS

Cardiovascular: Global cardiomegaly. This finding was present on
comparison chest x-ray of this year, and is new from the CT of
06/26/2012 and plain film of 04/08/2009.

No filling defects identified within the main pulmonary artery,
lobar pulmonary arteries, or proximal pulmonary arteries.

Unremarkable course caliber and contour of the thoracic aorta.

Mediastinum/Nodes: Unremarkable thoracic inlet. Multiple borderline
enlarged mediastinal lymph nodes. Lymph nodes of the right hilum are
partially calcified.

Lungs/Pleura: Diffuse interlobular septal thickening extending to
the periphery with thickening of the fissures and right greater than
left pleural effusion. Mild bronchial wall thickening. No
endobronchial debris. Early regions of ground-glass opacity
throughout the lungs. Atelectasis of the bilateral lower lobes.

Musculoskeletal: No acute displaced fracture.

CT ABDOMEN PELVIS FINDINGS

Hepatobiliary: No focal lesion of the liver. Periportal edema.
Refluxing contrast into the hepatic veins from the right heart.

No hyperdense material within the gallbladder lumen, however, there
is gallbladder wall thickening/pericholecystic fluid. No
extrahepatic biliary ductal dilatation or intrahepatic ductal
dilatation.

Pancreas: Unremarkable pancreas

Spleen: Unremarkable spleen

Adrenals/Urinary Tract: Unremarkable appearance of the adrenal
glands. No evidence of hydronephrosis of the right or left kidney.
No nephrolithiasis. Unremarkable course of the bilateral ureters.
Unremarkable appearance of the urinary bladder.

Stomach/Bowel: Stomach is within normal limits. Appendix appears
normal. No evidence of bowel wall thickening, distention, or
inflammatory changes.

Vascular/Lymphatic: No significant vascular findings are present. No
enlarged abdominal or pelvic lymph nodes.

Reproductive: Unremarkable pelvic structures

Other: Low-density free fluid layer dependently within the pelvis.
Mild mesenteric edema, particularly of the upper abdomen.

Musculoskeletal: No significant degenerative changes. No acute
fracture.
IMPRESSION: Pulmonary edema with right greater than left pleural effusions.

Cardiomegaly, which is new from the comparison CT of 06/26/2012.

Periportal edema, likely a manifestation of acute congestive heart
failure.

There is circumferential gallbladder wall thickening/fluid without
radiopaque stone burden. This finding can be seen in the setting of
congestive heart failure, which is the most likely reason for the CT
changes. If there is concern for acute biliary symptoms/acute
cholecystitis, correlation with nuclear medicine HIDA study may be
considered.

Mediastinal adenopathy, most likely reactive in the setting of
edema, however, lymphoproliferative disorder can not be excluded.

Calcified mediastinal lymph nodes, most likely secondary to prior
granulomatous disease.

## 2020-06-02 IMAGING — DX DG CHEST 1V PORT
1 series · 1 of 1 positions shown · non-contrast
Comparison: 05/30/2018

CLINICAL DATA: Migraines tonight. History of atrial fibrillation,
CHF, mild dyspnea now resolved. Smoker.

EXAM:
PORTABLE CHEST 1 VIEW

[chest]
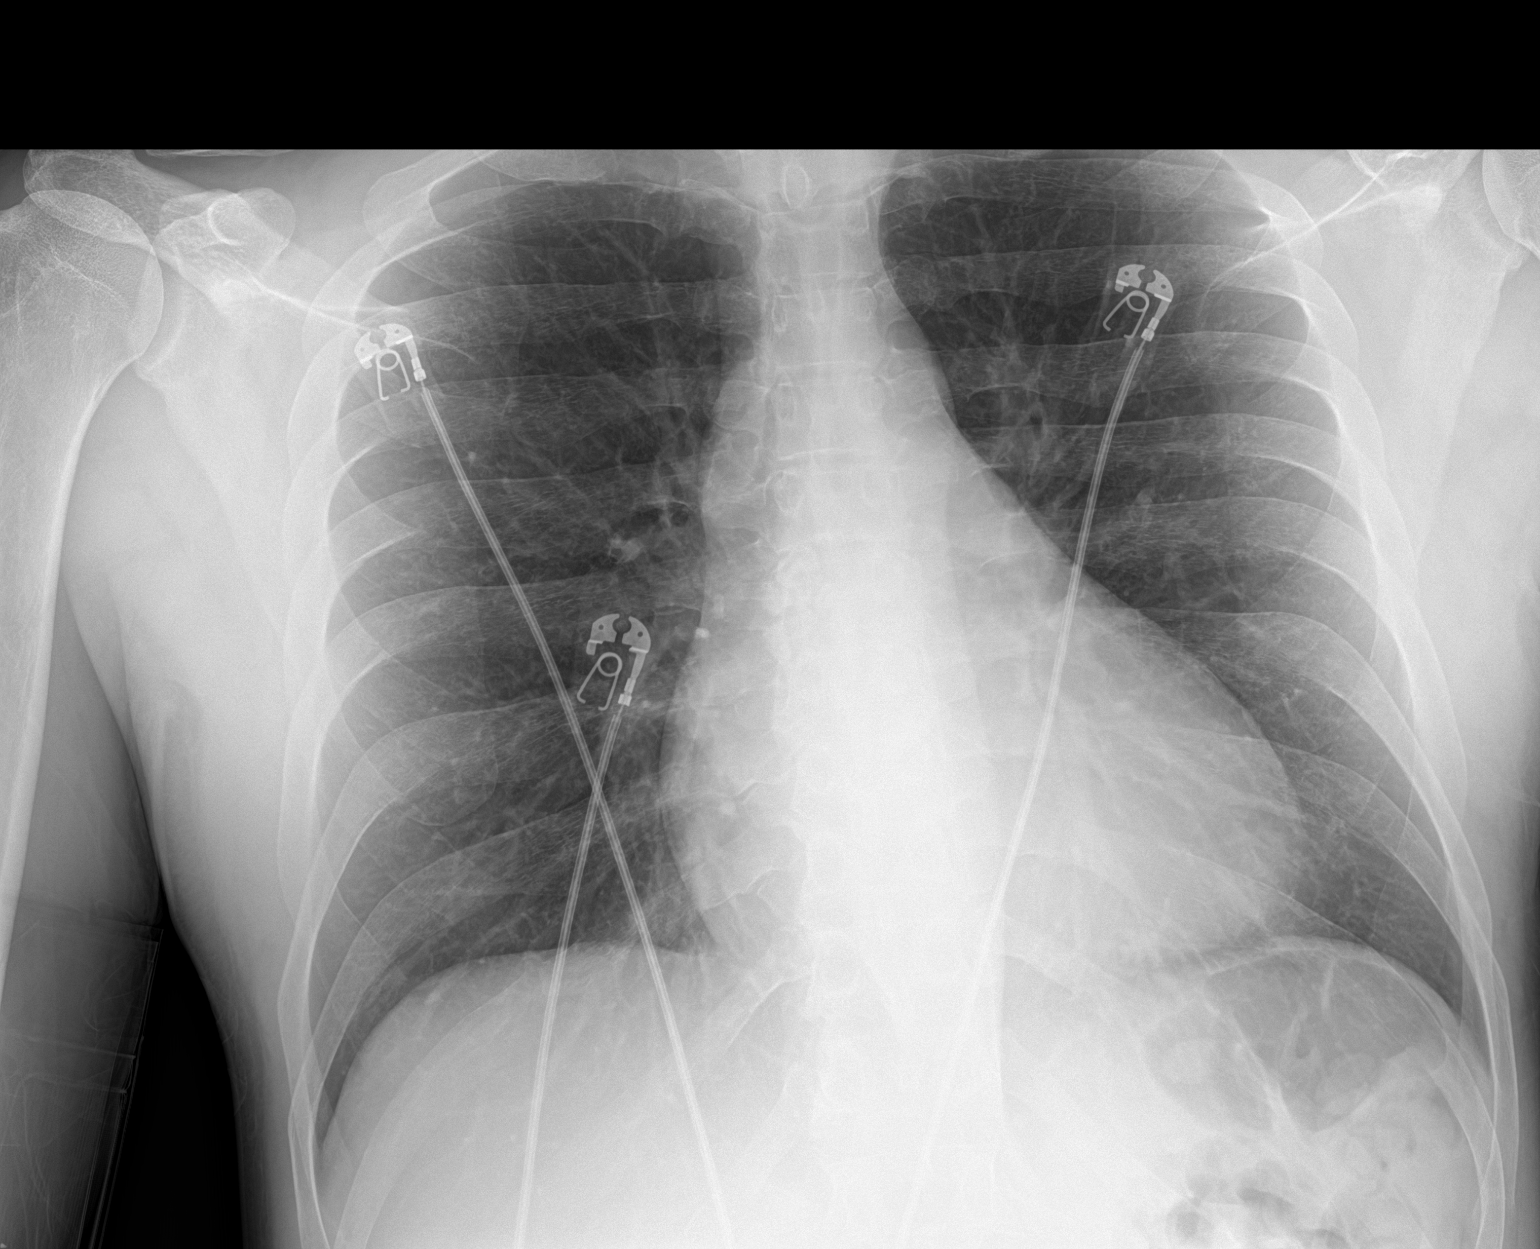

[1 of 1 positions shown; findings below may reference images not displayed]

FINDINGS: Mild cardiac enlargement. No vascular congestion. Lungs are clear.
Previous congestive changes and edema have resolved. No pleural
effusions. No pneumothorax. Calcified granulomas in the right lung
with calcified right mediastinal lymph nodes. Mediastinal contours
appear intact.
IMPRESSION: Cardiac enlargement. No evidence of active pulmonary disease.

## 2020-07-07 ENCOUNTER — Telehealth (HOSPITAL_COMMUNITY): Payer: Self-pay | Admitting: Pharmacist

## 2020-07-07 NOTE — Telephone Encounter (Signed)
Advanced Heart Failure Patient Advocate Encounter  Prior Authorization for Sherryll Burger has been approved.    PA# [id number] Effective dates: 07/07/2020 - 07/02/2021  Karle Plumber, PharmD, BCPS, BCCP, CPP Heart Failure Clinic Pharmacist [phone]

## 2020-07-07 NOTE — Telephone Encounter (Signed)
Patient Advocate Encounter   Received notification from [facility] Medicaid that prior authorization for Sherryll Burger is required.   PA submitted on [employer] Confirmation #: [id number] W Recipient ID:  [id number] L Status is pending   Will continue to follow.   Karle Plumber, PharmD, BCPS, BCCP, CPP Heart Failure Clinic Pharmacist [phone]

## 2020-07-18 ENCOUNTER — Other Ambulatory Visit (HOSPITAL_COMMUNITY): Payer: Self-pay

## 2020-07-18 MED ORDER — ENTRESTO 24-26 MG PO TABS: 1.0000 | ORAL_TABLET | Freq: Two times a day (BID) | ORAL | 11 refills | Status: DC

## 2020-08-25 ENCOUNTER — Telehealth (HOSPITAL_COMMUNITY): Payer: Self-pay | Admitting: Pharmacy Technician

## 2020-08-25 ENCOUNTER — Other Ambulatory Visit (HOSPITAL_COMMUNITY): Payer: Self-pay | Admitting: *Deleted

## 2020-08-25 NOTE — Telephone Encounter (Signed)
Patient Advocate Encounter   Received notification from [employer] that prior authorization for Sherryll Burger is required.   PA submitted on CoverMyMeds Key  [id number] Status is pending   Will continue to follow.

## 2020-08-26 ENCOUNTER — Other Ambulatory Visit (HOSPITAL_COMMUNITY): Payer: Self-pay | Admitting: *Deleted

## 2020-08-26 MED ORDER — ENTRESTO 24-26 MG PO TABS: 1.0000 | ORAL_TABLET | Freq: Two times a day (BID) | ORAL | 3 refills | Status: AC

## 2020-08-27 NOTE — Telephone Encounter (Signed)
Prior authorization has been cancelled by [employer]. Sherryll Burger is one of their covered medications.  Called and updated the patient's wife.  Archer Asa, CPhT

## 2020-08-29 ENCOUNTER — Telehealth (HOSPITAL_COMMUNITY): Payer: Self-pay | Admitting: Pharmacy Technician

## 2020-08-29 NOTE — Telephone Encounter (Signed)
Medication Samples have been provided to the patient.  Drug name: Sherryll Burger       Strength: 24-26mg         Qty: 1 bottle (14 days)  LOT: [id number]  Exp.Date: 12/23  Dosing instructions: 1 BID  The patient has been instructed regarding the correct time, dose, and frequency of taking this medication, including desired effects and most common side effects.   Allen Kell [facility] 10:46 AM 08/29/2020

## 2020-09-25 ENCOUNTER — Other Ambulatory Visit (HOSPITAL_COMMUNITY): Payer: Self-pay | Admitting: Internal Medicine

## 2020-10-22 ENCOUNTER — Other Ambulatory Visit: Payer: Self-pay

## 2020-10-22 ENCOUNTER — Encounter (HOSPITAL_COMMUNITY): Payer: Self-pay | Admitting: Internal Medicine

## 2020-10-22 ENCOUNTER — Ambulatory Visit (HOSPITAL_COMMUNITY)
Admission: RE | Admit: 2020-10-22 | Discharge: 2020-10-22 | Disposition: A | Discharge status: Home / Self Care | Payer: PRIVATE HEALTH INSURANCE | Source: Ambulatory Visit | Attending: Internal Medicine | Admitting: Internal Medicine

## 2020-10-22 VITALS — BP 102/60 | HR 80 | Wt 196.8 lb

## 2020-10-22 DIAGNOSIS — I428 Other cardiomyopathies: Secondary | ICD-10-CM | POA: Insufficient documentation

## 2020-10-22 DIAGNOSIS — F1910 Other psychoactive substance abuse, uncomplicated: Secondary | ICD-10-CM | POA: Diagnosis not present

## 2020-10-22 DIAGNOSIS — Z79899 Other long term (current) drug therapy: Secondary | ICD-10-CM | POA: Diagnosis not present

## 2020-10-22 DIAGNOSIS — I480 Paroxysmal atrial fibrillation: Secondary | ICD-10-CM

## 2020-10-22 DIAGNOSIS — I77810 Thoracic aortic ectasia: Secondary | ICD-10-CM | POA: Diagnosis not present

## 2020-10-22 DIAGNOSIS — I5022 Chronic systolic (congestive) heart failure: Secondary | ICD-10-CM | POA: Insufficient documentation

## 2020-10-22 DIAGNOSIS — I4891 Unspecified atrial fibrillation: Secondary | ICD-10-CM

## 2020-10-22 DIAGNOSIS — F1190 Opioid use, unspecified, uncomplicated: Secondary | ICD-10-CM | POA: Diagnosis not present

## 2020-10-22 DIAGNOSIS — F17210 Nicotine dependence, cigarettes, uncomplicated: Secondary | ICD-10-CM | POA: Diagnosis not present

## 2020-10-22 DIAGNOSIS — Q2544 Congenital dilation of aorta: Secondary | ICD-10-CM | POA: Diagnosis not present

## 2020-10-22 LAB — BASIC METABOLIC PANEL
Anion gap: 5 (ref 5–15)
BUN: 15 mg/dL (ref 6–20)
CO2: 28 mmol/L (ref 22–32)
Calcium: 8.8 mg/dL — ABNORMAL LOW (ref 8.9–10.3)
Chloride: 104 mmol/L (ref 98–111)
Creatinine, Ser: 1.3 mg/dL — ABNORMAL HIGH (ref 0.61–1.24)
GFR, Estimated: >60 mL/min (ref >60)
Glucose, Bld: 128 mg/dL — ABNORMAL HIGH (ref 70–99)
Potassium: 4.5 mmol/L (ref 3.5–5.1)
Sodium: 137 mmol/L (ref 135–145)

## 2020-10-22 LAB — CBC
HCT: 41.4 % (ref 39.0–52.0)
Hemoglobin: 13.4 g/dL (ref 13.0–17.0)
MCH: 28.8 pg (ref 26.0–34.0)
MCHC: 32.4 g/dL (ref 30.0–36.0)
MCV: 89 fL (ref 80.0–100.0)
Platelets: 283 10*3/uL (ref 150–400)
RBC: 4.65 MIL/uL (ref 4.22–5.81)
RDW: 13.2 % (ref 11.5–15.5)
WBC: 5.4 10*3/uL (ref 4.0–10.5)
nRBC: 0 % (ref 0.0–0.2)

## 2020-10-22 LAB — TSH: TSH: 1.227 u[IU]/mL (ref 0.350–4.500)

## 2020-10-22 LAB — BRAIN NATRIURETIC PEPTIDE: B Natriuretic Peptide: 245.2 pg/mL — ABNORMAL HIGH (ref 0.0–100.0)

## 2020-10-22 NOTE — Addendum Note (Signed)
Encounter addended by: Chinita Pester, CMA on: 10/22/2020 2:59 PM  Actions taken: Visit diagnoses modified, Order list changed, Diagnosis association updated, Charge Capture section accepted, Clinical Note Signed

## 2020-10-22 NOTE — Progress Notes (Signed)
Advanced [facility] Failure Clinic Note   Date:  10/22/2020   ID:  Kenneth [name], DOB March 08, 1976, MRN [id number]  Location: Home  Provider location: [place] [facility] Type of Visit: Established patient  PCP:  Patient, No Pcp Per (Inactive)  Cardiologist:  Arvilla Meres, MD Primary HF: Kenneth [name]  Chief Complaint: Heart Failure follow-up   History of Present Illness:  Kenneth [name] is a 45 y.o. male w/ h/o chronic systolic HF, paroxysmal Afib, polysubstance abuse, and chronic suboxone use.  Admitted 12/15 - 06/04/18 with new onset systolic CHF. Cath with NICM, EF 20% (normal cors) thought to be most likely due to combination of Afib with RVR and polysubstance abuse.  Medications adjusted as tolerated. He underwent TEE/DCCV 06/02/18 and maintained NSR to discharge.   Echo 6/20: EF 55-60% RV normal   He presents today for f/u. At last visit Kenneth [name] stopped due to orthostasis. It helped some. However over last few weeks gets very dizzy whenever he stands. Takes lasix about 1x/week because weight went up.   Echo 6/21 EF 60-65% RV normal AoRoot 4.6     Past Medical History:  Diagnosis Date  . A-fib (HCC)   . CHF (congestive heart failure) (HCC)   . Frequent headaches   . Narcotic abuse (HCC)   . PTSD (post-traumatic stress disorder)    Past Surgical History:  Procedure Laterality Date  . CARDIOVERSION N/A 06/02/2018   Procedure: CARDIOVERSION;  Surgeon: Dolores Patty, MD;  Location: [facility] ENDOSCOPY;  Service: Cardiovascular;  Laterality: N/A;  . RIGHT/LEFT HEART CATH AND CORONARY ANGIOGRAPHY N/A 06/01/2018   Procedure: RIGHT/LEFT HEART CATH AND CORONARY ANGIOGRAPHY;  Surgeon: Dolores Patty, MD;  Location: [facility] INVASIVE CV LAB;  Service: Cardiovascular;  Laterality: N/A;  . TEE WITHOUT CARDIOVERSION N/A 06/02/2018   Procedure: TRANSESOPHAGEAL ECHOCARDIOGRAM (TEE);  Surgeon: Dolores Patty, MD;  Location: [facility] ENDOSCOPY;  Service: Cardiovascular;   Laterality: N/A;  . TONSILLECTOMY       Current Outpatient Medications  Medication Sig Dispense Refill  . ALPRAZolam (XANAX) 1 MG tablet Take 1 mg by mouth daily as needed for anxiety.    . carvedilol (COREG) 3.125 MG tablet TAKE 1 TABLET (3.125 MG TOTAL) BY MOUTH 2 (TWO) TIMES DAILY WITH A MEAL. 180 tablet 2  . furosemide (LASIX) 40 MG tablet Take 1 tablet (40 mg total) by mouth as needed for fluid or edema. 30 tablet 6  . sacubitril-valsartan (ENTRESTO) 24-26 MG Take 1 tablet by mouth 2 (two) times daily. 180 tablet 3   No current facility-administered medications for this encounter.    Allergies:   Patient has no known allergies.   Social History:  The patient  reports that he has been smoking cigarettes. He has a 0.50 pack-year smoking history. He has quit using smokeless tobacco.  His smokeless tobacco use included snuff. He reports that he does not drink alcohol and does not use drugs.   Family History:  The patient's family history includes Miscarriages / Stillbirths in his brother; Schizophrenia in his brother.   ROS:  Please see the history of present illness.   All other systems are personally reviewed and negative.   Vitals:   10/22/20 1426  BP: 102/60  Pulse: 80  SpO2: 96%  Weight: 89.3 kg (196 lb 12.8 oz)    Exam:  General:  Well appearing. No resp difficulty HEENT: normal Neck: supple. no JVD. Carotids 2+ bilat; no bruits. No lymphadenopathy or thryomegaly appreciated. Cor: PMI  nondisplaced. Regular rate & rhythm. No rubs, gallops or murmurs. Lungs: clear Abdomen: soft, nontender, nondistended. No hepatosplenomegaly. No bruits or masses. Good bowel sounds. Extremities: no cyanosis, clubbing, rash, edema Neuro: alert & orientedx3, cranial nerves grossly intact. moves all 4 extremities w/o difficulty. Affect pleasant   Recent Labs: 12/05/2019: BUN 10; Creatinine, Ser 0.91; Hemoglobin 12.0; Platelets 247; Potassium 4.3; Sodium 139  Personally reviewed   Wt  Readings from Last 3 Encounters:  10/22/20 89.3 kg (196 lb 12.8 oz)  12/05/19 86.6 kg (191 lb)  06/18/19 83.9 kg (185 lb)      ASSESSMENT AND PLAN:  1. Chronic systolic CHF, NICM - ECHO 05/29/18 LVEF 20%.Likely in setting of chronic substance abuse and Afib RVR.  - NICM by cath 06/01/18.  - NYHA I - Echo 6/20 EF 55-60%.  - Looks good NYHA I. Volume status low.  - Continue Entresto 24/26 mg BID unable to tolerate higher doses due to severe dizziness - Will have him stop lasix completely and liberalize salt and fluid intake. If dizziness does not completely resolve and/or SBP is frequently < 100 will switch Entresto to losartan 25 - Off spiro due to orthostasis  - Continue coreg 3.125 mg BID - Repeat echo as below.   2. Paroxysmal Afib  -CHA2DS2-VASc2 (CHF, HTN). - Given that he has not had further AF and HF resolved and he works with chainsaws we decided stop Eliquis  3. Substance abuse - UDS 05/28/18 positive for opiates, BZDs, amphetamines, and THC. - He is on Methadone  4. Aortic root dilation   - will need repeat echo    Signed, Arvilla Meres, MD  10/22/2020 2:35 PM  Advanced [facility] [address] [facility] [place] [address] [phone] (office) [phone] (fax)

## 2020-10-22 NOTE — Patient Instructions (Addendum)
Labs done today. We will contact you only if your labs are abnormal.  No medication changes were made. Please continue all current medications as prescribed.  Your physician recommends that you schedule a follow-up appointment soon for an echo and in 1 year for an appointment with Dr. Gala Romney. Please contact our office in April 2023 for a May appointment.  Your physician has requested that you have an echocardiogram. Echocardiography is a painless test that uses sound waves to create images of your heart. It provides your doctor with information about the size and shape of your heart and how well your heart's chambers and valves are working. This procedure takes approximately one hour. There are no restrictions for this procedure.   If you have any questions or concerns before your next appointment please send [place] a message through [place] or call our office at [phone].    TO LEAVE A MESSAGE FOR THE NURSE SELECT OPTION 2, PLEASE LEAVE A MESSAGE INCLUDING: . YOUR NAME . DATE OF BIRTH . CALL BACK NUMBER . REASON FOR CALL**this is important as we prioritize the call backs  YOU WILL RECEIVE A CALL BACK THE SAME DAY AS LONG AS YOU CALL BEFORE 4:00 PM   Do the following things EVERYDAY: 1) Weigh yourself in the morning before breakfast. Write it down and keep it in a log. 2) Take your medicines as prescribed 3) Eat low salt foods--Limit salt (sodium) to 2000 mg per day.  4) Stay as active as you can everyday 5) Limit all fluids for the day to less than 2 liters   At the [facility] Clinic, you and your health needs are our priority. As part of our continuing mission to provide you with exceptional heart care, we have created designated Provider Care Teams. These Care Teams include your primary Cardiologist (physician) and Advanced Practice Providers (APPs- Physician Assistants and Nurse Practitioners) who all work together to provide you with the care you need, when you need  it.   You may see any of the following providers on your designated Care Team at your next follow up: Marland Kitchen Dr Arvilla Meres . Dr Marca Ancona . Tonye Becket, NP . Robbie Lis, PA . Karle Plumber, PharmD   Please be sure to bring in all your medications bottles to every appointment.

## 2020-11-07 ENCOUNTER — Other Ambulatory Visit: Payer: Self-pay

## 2020-11-07 ENCOUNTER — Encounter (HOSPITAL_COMMUNITY): Payer: Self-pay | Admitting: Emergency Medicine

## 2020-11-07 ENCOUNTER — Ambulatory Visit (HOSPITAL_COMMUNITY)
Admission: EM | Admit: 2020-11-07 | Discharge: 2020-11-07 | Disposition: A | Discharge status: Home / Self Care | Payer: PRIVATE HEALTH INSURANCE | Attending: Family Medicine | Admitting: Family Medicine

## 2020-11-07 DIAGNOSIS — R21 Rash and other nonspecific skin eruption: Secondary | ICD-10-CM | POA: Diagnosis not present

## 2020-11-07 MED ORDER — PERMETHRIN 5 % EX CREA: TOPICAL_CREAM | CUTANEOUS | 0 refills | Status: AC

## 2020-11-07 MED ORDER — DOXYCYCLINE HYCLATE 100 MG PO TABS: 100.0000 mg | ORAL_TABLET | Freq: Two times a day (BID) | ORAL | 0 refills | Status: DC

## 2020-11-07 NOTE — ED Triage Notes (Signed)
Pt presents with rash on arms and legs xs 1 week. States removed tick from abdomen area recently.   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

## 2020-11-07 NOTE — ED Provider Notes (Signed)
[facility]    CSN: [id number] Arrival date & time: 11/07/20  1906      History   Chief Complaint Chief Complaint  Patient presents with  . Rash    HPI Kenneth [name] is a 45 y.o. male.   Patient presenting today with a worsening itchy rash spreading to arms, legs and trunk the past few days and now a bad headache, fatigue. He states he removed a tick from his umbilicus about 2 weeks ago and this is where the rash seemed to start. Works outside with trees for a living. He has tried ivermectin cream OTC with minimal relief, worried he has scabies as he states he can see little black dots burrowing into the areas. Denies fever, chills, body aches, CP, SOB.      Past Medical History:  Diagnosis Date  . A-fib (HCC)   . CHF (congestive heart failure) (HCC)   . Frequent headaches   . Narcotic abuse (HCC)   . PTSD (post-traumatic stress disorder)     Patient Active Problem List   Diagnosis Date Noted  . Atrial fibrillation with rapid ventricular response (HCC) 05/29/2018  . New onset a-fib (HCC) 05/28/2018  . Acute CHF (congestive heart failure) (HCC) 05/28/2018  . Methamphetamine abuse (HCC) 05/28/2018  . PTSD (post-traumatic stress disorder) 05/20/2015  . 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 Frequent headaches 05/20/2015    Past Surgical History:  Procedure Laterality Date  . CARDIOVERSION N/A 06/02/2018   Procedure: CARDIOVERSION;  Surgeon: Dolores Patty, MD;  Location: [facility] ENDOSCOPY;  Service: Cardiovascular;  Laterality: N/A;  . RIGHT/LEFT HEART CATH AND CORONARY ANGIOGRAPHY N/A 06/01/2018   Procedure: RIGHT/LEFT HEART CATH AND CORONARY ANGIOGRAPHY;  Surgeon: Dolores Patty, MD;  Location: [facility] INVASIVE CV LAB;  Service: Cardiovascular;  Laterality: N/A;  . TEE WITHOUT CARDIOVERSION N/A 06/02/2018   Procedure: TRANSESOPHAGEAL ECHOCARDIOGRAM (TEE);  Surgeon: Dolores Patty, MD;  Location: [facility] ENDOSCOPY;  Service: Cardiovascular;   Laterality: N/A;  . TONSILLECTOMY         Home Medications    Prior to Admission medications   Medication Sig Start Date End Date Taking? Authorizing Provider  doxycycline (VIBRA-TABS) 100 MG tablet Take 1 tablet (100 mg total) by mouth 2 (two) times daily. 11/07/20  Yes Particia Nearing, PA-C  permethrin (ELIMITE) 5 % cream Apply to affected area once, leave on for 8-12 hours and then rinse off 11/07/20  Yes Maurice March, Salley Hews, PA-C  ALPRAZolam Prudy Feeler) 1 MG tablet Take 1 mg by mouth daily as needed for anxiety.    [provider]  carvedilol (COREG) 3.125 MG tablet TAKE 1 TABLET (3.125 MG TOTAL) BY MOUTH 2 (TWO) TIMES DAILY WITH A MEAL. 09/25/20   Bensimhon, Bevelyn Buckles, MD  furosemide (LASIX) 40 MG tablet Take 1 tablet (40 mg total) by mouth as needed for fluid or edema. 07/06/18   Graciella Freer, PA-C  sacubitril-valsartan (ENTRESTO) 24-26 MG Take 1 tablet by mouth 2 (two) times daily. 08/26/20   Bensimhon, Bevelyn Buckles, MD    Family History Family History  Problem Relation Age of Onset  . Miscarriages / [place] Brother   . Schizophrenia Brother     Social History Social History   Tobacco Use  . Smoking status: Current Every Day Smoker    Packs/day: 1.00    Years: 0.50    Pack years: 0.50    Types: Cigarettes  . Smokeless tobacco: Former Neurosurgeon    Types: Snuff  Vaping Use  . Vaping Use: Some days  Substance Use Topics  . Alcohol use: No    Alcohol/week: 0.0 standard drinks  . Drug use: No    Types: Cocaine, Marijuana    Comment: no cocaine since 2018     Allergies   Patient has no known allergies.   Review of Systems Review of Systems PER HPI    Physical Exam Triage Vital Signs ED Triage Vitals  Enc Vitals Group     BP 11/07/20 1934 (!) 121/93     Pulse Rate 11/07/20 1934 89     Resp 11/07/20 1934 18     Temp 11/07/20 1934 97.9 F (36.6 C)     Temp Source 11/07/20 1934 Oral     SpO2 11/07/20 1934 100 %     Weight --       Height --      Head Circumference --      Peak Flow --      Pain Score 11/07/20 1931 0     Pain Loc --      Pain Edu? --      Excl. in GC? --    No data found.  Updated Vital Signs BP (!) 121/93 (BP Location: Right Arm)   Pulse 89   Temp 97.9 F (36.6 C) (Oral)   Resp 18   SpO2 100%   Visual Acuity Right Eye Distance:   Left Eye Distance:   Bilateral Distance:    Right Eye Near:   Left Eye Near:    Bilateral Near:     Physical Exam Vitals and nursing note reviewed.  Constitutional:      Appearance: Normal appearance.  HENT:     Head: Atraumatic.  Eyes:     Extraocular Movements: Extraocular movements intact.     Conjunctiva/sclera: Conjunctivae normal.  Cardiovascular:     Rate and Rhythm: Normal rate and regular rhythm.     Heart sounds: Normal heart sounds.  Pulmonary:     Effort: Pulmonary effort is normal.     Breath sounds: Normal breath sounds.  Musculoskeletal:        General: Normal range of motion.     Cervical back: Normal range of motion and neck supple.  Skin:    General: Skin is warm and dry.     Findings: Rash present.     Comments: Erythematous papular rash sporadically across trunk and extremities including several small scabbing areas on palms that it appears he's been itching/digging at, most areas scabbing over diffusely  Neurological:     General: No focal deficit present.     Mental Status: He is oriented to person, place, and time.  Psychiatric:        Mood and Affect: Mood normal.        Thought Content: Thought content normal.        Judgment: Judgment normal.      UC Treatments / Results  Labs (all labs ordered are listed, but only abnormal results are displayed) Labs Reviewed - No data to display  EKG   Radiology No results found.  Procedures Procedures (including critical care time)  Medications Ordered in UC Medications - No data to display  Initial Impression / Assessment and Plan / UC Course  I have reviewed  the triage vital signs and the nursing notes.  Pertinent labs & imaging results that were available during my care of the patient were reviewed by me and considered in my medical decision making (  see chart for details).     Greatest concern is for RMSF given outdoor exposures and known recent tick bite just before sxs, now also with bad headache which is abnormal for him. Will treat with doxycycline but also send permethrin as patient is incredibly anxious about possibly having scabies. F/u with PCP or Dermatology if not resolving with these treatments.   Final Clinical Impressions(s) / UC Diagnoses   Final diagnoses:  Rash   Discharge Instructions   None    ED Prescriptions    Medication Sig Dispense Auth. Provider   doxycycline (VIBRA-TABS) 100 MG tablet Take 1 tablet (100 mg total) by mouth 2 (two) times daily. 14 tablet Particia Nearing, PA-C   permethrin (ELIMITE) 5 % cream Apply to affected area once, leave on for 8-12 hours and then rinse off 60 g Particia Nearing, [place]     PDMP not reviewed this encounter.   Roosvelt Maser [place], [place] 11/08/20 [phone]

## 2020-12-03 ENCOUNTER — Other Ambulatory Visit (HOSPITAL_COMMUNITY): Payer: Self-pay | Admitting: Internal Medicine

## 2020-12-03 ENCOUNTER — Encounter (HOSPITAL_COMMUNITY): Payer: Self-pay

## 2020-12-03 ENCOUNTER — Ambulatory Visit (HOSPITAL_COMMUNITY)
Admission: RE | Admit: 2020-12-03 | Discharge: 2020-12-03 | Disposition: A | Discharge status: Home / Self Care | Payer: PRIVATE HEALTH INSURANCE | Source: Ambulatory Visit | Attending: Internal Medicine | Admitting: Internal Medicine

## 2020-12-03 ENCOUNTER — Other Ambulatory Visit: Payer: Self-pay

## 2020-12-03 ENCOUNTER — Ambulatory Visit (HOSPITAL_COMMUNITY)
Admission: RE | Admit: 2020-12-03 | Discharge: 2020-12-03 | Disposition: A | Discharge status: Home / Self Care | Payer: No Typology Code available for payment source | Source: Ambulatory Visit | Attending: Internal Medicine | Admitting: Internal Medicine

## 2020-12-03 ENCOUNTER — Ambulatory Visit (HOSPITAL_BASED_OUTPATIENT_CLINIC_OR_DEPARTMENT_OTHER)
Admission: RE | Admit: 2020-12-03 | Discharge: 2020-12-03 | Disposition: A | Discharge status: Home / Self Care | Payer: No Typology Code available for payment source | Source: Ambulatory Visit | Attending: Internal Medicine | Admitting: Internal Medicine

## 2020-12-03 ENCOUNTER — Other Ambulatory Visit (HOSPITAL_COMMUNITY): Payer: Self-pay

## 2020-12-03 ENCOUNTER — Encounter (HOSPITAL_COMMUNITY): Payer: Self-pay | Admitting: Internal Medicine

## 2020-12-03 VITALS — BP 118/60 | HR 120 | Wt 190.0 lb

## 2020-12-03 DIAGNOSIS — I480 Paroxysmal atrial fibrillation: Secondary | ICD-10-CM | POA: Insufficient documentation

## 2020-12-03 DIAGNOSIS — I428 Other cardiomyopathies: Secondary | ICD-10-CM | POA: Insufficient documentation

## 2020-12-03 DIAGNOSIS — F1190 Opioid use, unspecified, uncomplicated: Secondary | ICD-10-CM

## 2020-12-03 DIAGNOSIS — I4891 Unspecified atrial fibrillation: Secondary | ICD-10-CM

## 2020-12-03 DIAGNOSIS — F1910 Other psychoactive substance abuse, uncomplicated: Secondary | ICD-10-CM | POA: Insufficient documentation

## 2020-12-03 DIAGNOSIS — F17210 Nicotine dependence, cigarettes, uncomplicated: Secondary | ICD-10-CM | POA: Insufficient documentation

## 2020-12-03 DIAGNOSIS — Z7901 Long term (current) use of anticoagulants: Secondary | ICD-10-CM | POA: Insufficient documentation

## 2020-12-03 DIAGNOSIS — I5022 Chronic systolic (congestive) heart failure: Secondary | ICD-10-CM | POA: Diagnosis present

## 2020-12-03 DIAGNOSIS — Z79899 Other long term (current) drug therapy: Secondary | ICD-10-CM | POA: Diagnosis not present

## 2020-12-03 DIAGNOSIS — I77810 Thoracic aortic ectasia: Secondary | ICD-10-CM | POA: Diagnosis not present

## 2020-12-03 MED ORDER — AMIODARONE HCL 200 MG PO TABS: 200.0000 mg | ORAL_TABLET | Freq: Two times a day (BID) | ORAL | 1 refills | Status: AC

## 2020-12-03 MED ORDER — APIXABAN 5 MG PO TABS: 5.0000 mg | ORAL_TABLET | Freq: Two times a day (BID) | ORAL | 6 refills | Status: AC

## 2020-12-03 NOTE — Progress Notes (Signed)
Advanced Heart Failure Clinic Note   Date:  12/03/2020   ID:  Kenneth [name], DOB May 02, 1976, MRN [id number]  Location: Home  Provider location: [place] [facility] Type of Visit: Established patient  PCP:  Patient, No Pcp Per (Inactive)  Cardiologist:  Arvilla Meres, MD Primary HF: Andre Gallego  Chief Complaint: Heart Failure follow-up   History of Present Illness:  Kenneth [name] is a 45 y.o. male w/ h/o chronic systolic HF, paroxysmal Afib, polysubstance abuse, and chronic suboxone use.   Admitted 12/15 - 06/04/18 with new onset systolic CHF. Cath with NICM, EF 20% (normal cors) thought to be most likely due to combination of Afib with RVR and polysubstance abuse.  Medications adjusted as tolerated. He underwent TEE/DCCV 06/02/18 and maintained NSR to discharge.   Echo 6/20: EF 55-60% RV normal   Echo 6/21 EF 60-65% RV normal AoRoot 4.6   Patient presented today for f/u echo. Found to be in rapid AF with rates 120-140s so scheduled for an urgent visit. Denies CP, SOB or palpitations. Says he started using opioids again and was trying to but himself through home detox this weekend and had bad DTs. Now on suboxone.   Brief echo images with EF 430-45% range today Personally reviewed   Past Medical History:  Diagnosis Date   A-fib (HCC)    CHF (congestive heart failure) (HCC)    Frequent headaches    Narcotic abuse (HCC)    PTSD (post-traumatic stress disorder)    Past Surgical History:  Procedure Laterality Date   CARDIOVERSION N/A 06/02/2018   Procedure: CARDIOVERSION;  Surgeon: Dolores Patty, MD;  Location: [facility] ENDOSCOPY;  Service: Cardiovascular;  Laterality: N/A;   RIGHT/LEFT HEART CATH AND CORONARY ANGIOGRAPHY N/A 06/01/2018   Procedure: RIGHT/LEFT HEART CATH AND CORONARY ANGIOGRAPHY;  Surgeon: Dolores Patty, MD;  [facility]: [facility] INVASIVE CV LAB;  Service: Cardiovascular;  Laterality: N/A;   TEE WITHOUT CARDIOVERSION N/A 06/02/2018    Procedure: TRANSESOPHAGEAL ECHOCARDIOGRAM (TEE);  Surgeon: Dolores Patty, MD;  Location: [facility] ENDOSCOPY;  Service: Cardiovascular;  Laterality: N/A;   TONSILLECTOMY       Current Outpatient Medications  Medication Sig Dispense Refill   ALPRAZolam (XANAX) 1 MG tablet Take 1 mg by mouth daily as needed for anxiety.     amiodarone (PACERONE) 200 MG tablet Take 1 tablet (200 mg total) by mouth 2 (two) times daily. 60 tablet 1   apixaban (ELIQUIS) 5 MG TABS tablet Take 1 tablet (5 mg total) by mouth 2 (two) times daily. 60 tablet 6   carvedilol (COREG) 3.125 MG tablet TAKE 1 TABLET (3.125 MG TOTAL) BY MOUTH 2 (TWO) TIMES DAILY WITH A MEAL. 180 tablet 2   furosemide (LASIX) 40 MG tablet Take 1 tablet (40 mg total) by mouth as needed for fluid or edema. 30 tablet 6   permethrin (ELIMITE) 5 % cream Apply to affected area once, leave on for 8-12 hours and then rinse off 60 g 0   sacubitril-valsartan (ENTRESTO) 24-26 MG Take 1 tablet by mouth 2 (two) times daily. 180 tablet 3   No current facility-administered medications for this encounter.    Allergies:   Patient has no known allergies.   Social History:  The patient  reports that he has been smoking cigarettes. He has a 0.50 pack-year smoking history. He has quit using smokeless tobacco.  His smokeless tobacco use included snuff. He reports that he does not drink alcohol and does not use drugs.  Family History:  The patient's family history includes Miscarriages / Stillbirths in his brother; Schizophrenia in his brother.   ROS:  Please see the history of present illness.   All other systems are personally reviewed and negative.   Vitals:   12/03/20 1446  BP: 118/60  Pulse: (!) 55  SpO2: 97%  Weight: 86.2 kg (190 lb)    Exam:  General:  Well appearing. No resp difficulty HEENT: normal Neck: supple. no JVD. Carotids 2+ bilat; no bruits. No lymphadenopathy or thryomegaly appreciated. Cor: PMI nondisplaced. Irregular tachy No rubs,  gallops or murmurs. Lungs: clear Abdomen: soft, nontender, nondistended. No hepatosplenomegaly. No bruits or masses. Good bowel sounds. Extremities: no cyanosis, clubbing, rash, edema Neuro: alert & orientedx3, cranial nerves grossly intact. moves all 4 extremities w/o difficulty. Affect pleasant    Recent Labs: 10/22/2020: B Natriuretic Peptide 245.2; BUN 15; Creatinine, Ser 1.30; Hemoglobin 13.4; Platelets 283; Potassium 4.5; Sodium 137; TSH 1.227  Personally reviewed   Wt Readings from Last 3 Encounters:  12/03/20 86.2 kg (190 lb)  10/22/20 89.3 kg (196 lb 12.8 oz)  12/05/19 86.6 kg (191 lb)    ECG: AF 113 Personally reviewed  ASSESSMENT AND PLAN:  1. Chronic systolic CHF, NICM - ECHO 05/29/18 LVEF 20%.Likely in setting of chronic substance abuse and Afib RVR.  - NICM by cath 06/01/18.  - Remains NYHA I despite rapid AF - Echo 6/20 EF 55-60%.  - Echo 6/21 EF 60-65% RV normal AoRoot 4.6  - Echo today 12/03/20 EF 40-45% - in setting of recurrent AF with RVR(not formal study due to AF)  - Continue Entresto 24/26 mg BID unable to tolerate higher doses due to severe dizziness - Off spiro due to orthostasis  - Continue coreg 3.125 mg BID - He is at high risk of recurrent severe LV dysfunction in setting of recurrent AF with RVR. Plan as below   2. Paroxysmal Afib  - CHA2DS2-VASc 2 (CHF, HTN). - High risk for recurrent tachy CM - Start amio 200 bid - Resume eliquis 5 bid - Place 14 day zio   3. Substance abuse - UDS 05/28/18 positive for opiates, BZDs, amphetamines, and THC.  - He is now on suboxone  4. Aortic root dilation   - will need repeat echo   Total time spent 45 minutes. Over half that time spent discussing above.   Signed, Arvilla Meres, MD  12/03/2020 11:48 PM  Advanced [facility] Failure Clinic [facility] [address] [facility] [place] [address] [phone] (office) [phone] (fax)

## 2020-12-03 NOTE — Patient Instructions (Addendum)
Start Amiodarone 200 mg Twice daily   Start Eliquis 5 mg Twice daily, this is a blood thinner  Your provider has recommended that  you wear a Zio Patch for 14 days.  This monitor will record your heart rhythm for our review.  IF you have any symptoms while wearing the monitor please press the button.  If you have any issues with the patch or you notice a red or orange light on it please call the company at [phone].  Once you remove the patch please mail it back to the company as soon as possible so we can get the results. THIS IS A LIVE TIME MONITOR SO IF YOU HAVE ANY ABNORMALITIES THEY COMPANY WILL CALL YOU DIRECTLY  We have provided you a note to remain out of work until your next appointment  Your physician recommends that you schedule a follow-up appointment in: 1 month  If you have any questions or concerns before your next appointment please send [place] a message through [place] or call our office at [phone].    TO LEAVE A MESSAGE FOR THE NURSE SELECT OPTION 2, PLEASE LEAVE A MESSAGE INCLUDING: YOUR NAME DATE OF BIRTH CALL BACK NUMBER REASON FOR CALL**this is important as we prioritize the call backs  YOU WILL RECEIVE A CALL BACK THE SAME DAY AS LONG AS YOU CALL BEFORE 4:00 PM  At the [facility], you and your health needs are our priority. As part of our continuing mission to provide you with exceptional heart care, we have created designated Provider Care Teams. These Care Teams include your primary Cardiologist (physician) and Advanced Practice Providers (APPs- Physician Assistants and Nurse Practitioners) who all work together to provide you with the care you need, when you need it.   You may see any of the following providers on your designated Care Team at your next follow up: Dr Arvilla Meres Dr Marca Ancona Dr Brandon Melnick, NP Robbie Lis, [place] Mikki Santee Karle Plumber, PharmD   Please be sure to bring in all your  medications bottles to every appointment.

## 2020-12-04 ENCOUNTER — Emergency Department (HOSPITAL_COMMUNITY)
Admission: EM | Admit: 2020-12-04 | Discharge: 2020-12-12 | Disposition: E | Payer: No Typology Code available for payment source | Attending: Emergency Medicine | Admitting: Emergency Medicine

## 2020-12-04 ENCOUNTER — Encounter (HOSPITAL_COMMUNITY): Admission: EM | Disposition: E | Payer: Self-pay | Source: Home / Self Care | Attending: Emergency Medicine

## 2020-12-04 ENCOUNTER — Emergency Department (HOSPITAL_COMMUNITY): Payer: No Typology Code available for payment source

## 2020-12-04 ENCOUNTER — Encounter (HOSPITAL_COMMUNITY): Payer: Self-pay | Admitting: Anesthesiology

## 2020-12-04 ENCOUNTER — Telehealth (HOSPITAL_COMMUNITY): Payer: Self-pay | Admitting: Cardiology

## 2020-12-04 DIAGNOSIS — S41132A Puncture wound without foreign body of left upper arm, initial encounter: Secondary | ICD-10-CM | POA: Insufficient documentation

## 2020-12-04 DIAGNOSIS — W3400XA Accidental discharge from unspecified firearms or gun, initial encounter: Secondary | ICD-10-CM | POA: Insufficient documentation

## 2020-12-04 DIAGNOSIS — S3991XA Unspecified injury of abdomen, initial encounter: Secondary | ICD-10-CM | POA: Diagnosis present

## 2020-12-04 DIAGNOSIS — S21109A Unspecified open wound of unspecified front wall of thorax without penetration into thoracic cavity, initial encounter: Secondary | ICD-10-CM | POA: Insufficient documentation

## 2020-12-04 DIAGNOSIS — Z20822 Contact with and (suspected) exposure to covid-19: Secondary | ICD-10-CM | POA: Insufficient documentation

## 2020-12-04 DIAGNOSIS — S31101A Unspecified open wound of abdominal wall, left upper quadrant without penetration into peritoneal cavity, initial encounter: Secondary | ICD-10-CM | POA: Diagnosis not present

## 2020-12-04 LAB — RESP PANEL BY RT-PCR (FLU A&B, COVID) ARPGX2
Influenza A by PCR: NEGATIVE
Influenza B by PCR: NEGATIVE
SARS Coronavirus 2 by RT PCR: NEGATIVE

## 2020-12-04 SURGERY — LAPAROTOMY, EXPLORATORY
Anesthesia: General

## 2020-12-04 MED ORDER — SUCCINYLCHOLINE CHLORIDE 20 MG/ML IJ SOLN
INTRAMUSCULAR | Status: AC | PRN
  Administered 2020-12-04: 100 mg via INTRAVENOUS

## 2020-12-04 MED ORDER — ETOMIDATE 2 MG/ML IV SOLN
INTRAVENOUS | Status: AC | PRN
  Administered 2020-12-04: 20 mg via INTRAVENOUS

## 2020-12-05 LAB — TYPE AND SCREEN
Unit division: 0
Unit division: 0
Unit division: 0
Unit division: 0
Unit division: 0
Unit division: 0
Unit division: 0
Unit division: 0

## 2020-12-05 LAB — BPAM RBC
Blood Product Expiration Date: 202207072359
Blood Product Expiration Date: 202207092359
Blood Product Expiration Date: 202207092359
Blood Product Expiration Date: 202207202359
Blood Product Expiration Date: 202207282359
Blood Product Expiration Date: 202207292359
Blood Product Expiration Date: 202207292359
Blood Product Expiration Date: 202207292359
ISSUE DATE / TIME: 202206230213
ISSUE DATE / TIME: 202206230218
ISSUE DATE / TIME: 202206230218
ISSUE DATE / TIME: 202206230218
ISSUE DATE / TIME: 202206230218
ISSUE DATE / TIME: 202206230225
ISSUE DATE / TIME: 202206230225
ISSUE DATE / TIME: 202206230225
Unit Type and Rh: 5100
Unit Type and Rh: 5100
Unit Type and Rh: 5100
Unit Type and Rh: 5100
Unit Type and Rh: 5100
Unit Type and Rh: 5100
Unit Type and Rh: 5100
Unit Type and Rh: 5100

## 2020-12-05 LAB — PREPARE FRESH FROZEN PLASMA
Unit division: 0
Unit division: 0
Unit division: 0
Unit division: 0
Unit division: 0
Unit division: 0

## 2020-12-05 LAB — BPAM FFP
Blood Product Expiration Date: 202206292359
Blood Product Expiration Date: 202206292359
Blood Product Expiration Date: 202207052359
Blood Product Expiration Date: 202207092359
Blood Product Expiration Date: 202207112359
Blood Product Expiration Date: 202207112359
ISSUE DATE / TIME: 202206230221
ISSUE DATE / TIME: 202206230221
ISSUE DATE / TIME: 202206230221
ISSUE DATE / TIME: 202206230221
ISSUE DATE / TIME: 202206230224
ISSUE DATE / TIME: 202206230224
Unit Type and Rh: 6200
Unit Type and Rh: 6200
Unit Type and Rh: 6200
Unit Type and Rh: 6200
Unit Type and Rh: 6200
Unit Type and Rh: 6200
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
## 2020-12-12 NOTE — ED Notes (Signed)
Chest tube to L chest placed by MD Fredricka Bonine

## 2020-12-12 NOTE — ED Notes (Signed)
MD Fredricka Bonine performed bedside thoracotomy
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
## 2020-12-12 NOTE — ED Notes (Signed)
Patient time of death occurred at 0230 .
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
## 2020-12-12 NOTE — ED Notes (Signed)
Cardiac massage
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
## 2020-12-12 DEATH — deceased

## 2020-12-25 ENCOUNTER — Other Ambulatory Visit (HOSPITAL_COMMUNITY): Payer: Self-pay | Admitting: Internal Medicine

## 2020-12-29 ENCOUNTER — Encounter (HOSPITAL_COMMUNITY): Payer: PRIVATE HEALTH INSURANCE | Admitting: Internal Medicine

## 2021-01-05 ENCOUNTER — Encounter (HOSPITAL_COMMUNITY): Payer: PRIVATE HEALTH INSURANCE
